# Patient Record
Sex: Female | Born: 1940 | Race: White | Hispanic: No | Marital: Married | Smoking: Never smoker
Health system: Southern US, Community
[De-identification: ages and names within clinical notes are randomized; demographics above are authoritative.]

## PROBLEM LIST (undated history)

## (undated) DIAGNOSIS — K219 Gastro-esophageal reflux disease without esophagitis: Secondary | ICD-10-CM

## (undated) DIAGNOSIS — E785 Hyperlipidemia, unspecified: Secondary | ICD-10-CM

## (undated) DIAGNOSIS — E079 Disorder of thyroid, unspecified: Secondary | ICD-10-CM

## (undated) DIAGNOSIS — E039 Hypothyroidism, unspecified: Secondary | ICD-10-CM

## (undated) DIAGNOSIS — C801 Malignant (primary) neoplasm, unspecified: Secondary | ICD-10-CM

## (undated) DIAGNOSIS — M1990 Unspecified osteoarthritis, unspecified site: Secondary | ICD-10-CM

## (undated) HISTORY — DX: Disorder of thyroid, unspecified: E07.9

## (undated) HISTORY — DX: Hyperlipidemia, unspecified: E78.5

## (undated) HISTORY — PX: APPENDECTOMY: SHX54

## (undated) HISTORY — PX: MELANOMA EXCISION: SHX5266

---

## 2004-08-26 LAB — HM COLONOSCOPY: HM Colonoscopy: NORMAL

## 2004-08-26 LAB — HM DEXA SCAN: HM Dexa Scan: NORMAL

## 2008-06-23 ENCOUNTER — Ambulatory Visit: Payer: Self-pay | Admitting: Otolaryngology

## 2010-06-22 LAB — HM PAP SMEAR: HM Pap smear: NORMAL

## 2011-07-08 ENCOUNTER — Other Ambulatory Visit: Payer: Medicare Other

## 2011-07-11 ENCOUNTER — Ambulatory Visit (INDEPENDENT_AMBULATORY_CARE_PROVIDER_SITE_OTHER): Payer: Medicare Other | Admitting: Internal Medicine

## 2011-07-11 ENCOUNTER — Encounter: Payer: Self-pay | Admitting: *Deleted

## 2011-07-11 ENCOUNTER — Encounter: Payer: Self-pay | Admitting: Internal Medicine

## 2011-07-11 DIAGNOSIS — Z13220 Encounter for screening for lipoid disorders: Secondary | ICD-10-CM

## 2011-07-11 DIAGNOSIS — Z0000 Encounter for general adult medical examination without abnormal findings: Secondary | ICD-10-CM

## 2011-07-11 DIAGNOSIS — M8580 Other specified disorders of bone density and structure, unspecified site: Secondary | ICD-10-CM

## 2011-07-11 DIAGNOSIS — Z23 Encounter for immunization: Secondary | ICD-10-CM

## 2011-07-11 DIAGNOSIS — E785 Hyperlipidemia, unspecified: Secondary | ICD-10-CM

## 2011-07-11 DIAGNOSIS — Z124 Encounter for screening for malignant neoplasm of cervix: Secondary | ICD-10-CM

## 2011-07-11 DIAGNOSIS — R5381 Other malaise: Secondary | ICD-10-CM

## 2011-07-11 DIAGNOSIS — E039 Hypothyroidism, unspecified: Secondary | ICD-10-CM

## 2011-07-11 DIAGNOSIS — Z1211 Encounter for screening for malignant neoplasm of colon: Secondary | ICD-10-CM | POA: Insufficient documentation

## 2011-07-11 DIAGNOSIS — Z1239 Encounter for other screening for malignant neoplasm of breast: Secondary | ICD-10-CM | POA: Insufficient documentation

## 2011-07-11 DIAGNOSIS — R5383 Other fatigue: Secondary | ICD-10-CM

## 2011-07-11 LAB — LIPID PANEL
HDL: 61.2 mg/dL (ref >39.00)
Triglycerides: 95 mg/dL (ref 0.0–149.0)

## 2011-07-11 LAB — CBC WITH DIFFERENTIAL/PLATELET
Basophils Absolute: 0 10*3/uL (ref 0.0–0.1)
Eosinophils Absolute: 0.1 10*3/uL (ref 0.0–0.7)
Lymphocytes Relative: 28.1 % (ref 12.0–46.0)
MCHC: 33 g/dL (ref 30.0–36.0)
MCV: 96.8 fl (ref 78.0–100.0)
Monocytes Absolute: 0.4 10*3/uL (ref 0.1–1.0)
Neutrophils Relative %: 62.2 % (ref 43.0–77.0)
Platelets: 166 10*3/uL (ref 150.0–400.0)
RDW: 13.2 % (ref 11.5–14.6)

## 2011-07-11 LAB — COMPREHENSIVE METABOLIC PANEL
ALT: 23 U/L (ref 0–35)
AST: 25 U/L (ref 0–37)
Albumin: 4 g/dL (ref 3.5–5.2)
Alkaline Phosphatase: 50 U/L (ref 39–117)
BUN: 13 mg/dL (ref 6–23)
Calcium: 9.4 mg/dL (ref 8.4–10.5)
Chloride: 105 mEq/L (ref 96–112)
Potassium: 4.2 mEq/L (ref 3.5–5.1)
Sodium: 143 mEq/L (ref 135–145)
Total Protein: 7.6 g/dL (ref 6.0–8.3)

## 2011-07-11 LAB — LDL CHOLESTEROL, DIRECT: Direct LDL: 184.3 mg/dL

## 2011-07-11 NOTE — Progress Notes (Signed)
  Subjective:    Patient ID: Samantha Hanna, female    DOB: 11/15/1940, 70 y.o.   MRN: [id number]  HPI    Review of Systems     Objective:   Physical Exam        Assessment & Plan:   Subjective:     Samantha Hanna is a 70 y.o. female and is here for a comprehensive physical exam. The patient reports no problems.  History   Social History  . Marital Status: Married    Spouse Name: N/A    Number of Children: N/A  . Years of Education: N/A   Occupational History  . Not on file.   Social History Main Topics  . Smoking status: Never Smoker   . Smokeless tobacco: Never Used  . Alcohol Use: Yes  . Drug Use: No  . Sexually Active: Not on file   Other Topics Concern  . Not on file   Social History Narrative  . No narrative on file   Health Maintenance  Topic Date Due  . Tetanus/tdap  07/02/1962  . Mammogram  07/02/1993  . Zostavax  07/03/2003  . Influenza Vaccine  05/27/2011  . Colonoscopy  08/26/2014  . Pneumococcal Polysaccharide Vaccine Age 70 And Over  Completed    The following portions of the patient's history were reviewed and updated as appropriate: allergies, current medications, past family history, past medical history, past social history, past surgical history and problem list.  Review of Systems A comprehensive review of systems was negative.   Objective:    BP 118/64  Pulse 72  Temp(Src) 98.3 F (36.8 C) (Oral)  Resp 16  Ht 5\' 8"  (1.727 m)  Wt 149 lb 4 oz (67.699 kg)  BMI 22.69 kg/m2  SpO2 99% General appearance: alert, cooperative and appears stated age Ears: normal TM's and external ear canals both ears Throat: lips, mucosa, and tongue normal; teeth and gums normal Neck: no adenopathy, no carotid bruit, no JVD, supple, symmetrical, trachea midline and thyroid not enlarged, symmetric, no tenderness/mass/nodules Back: symmetric, no curvature. ROM normal. No CVA tenderness. Lungs: clear to auscultation bilaterally Breasts: normal appearance, no  masses or tenderness Heart: regular rate and rhythm, S1, S2 normal, no murmur, click, rub or gallop Abdomen: soft, non-tender; bowel sounds normal; no masses,  no organomegaly Extremities: extremities normal, atraumatic, no cyanosis or edema Pulses: 2+ and symmetric Skin: Skin color, texture, turgor normal. No rashes or lesions Neurologic: Alert and oriented X 3, normal strength and tone. Normal symmetric reflexes. Normal coordination and gait    Assessment:    Healthy female exam.    Pelvic exam was deferred per patient preference, as she had a normal PAP smear in 2011.  Plan:    Continue daily exercise.   Fasting lipids ordered and will review.  Has histroy of myalgias with statin therapy.  Adding baby aspirin several times weekly for stroke and CAD primary prevention

## 2011-07-11 NOTE — Patient Instructions (Signed)
We are checking your thyroid and cholesterol today and will adjust your Synthroid dose if necessary.  I do recommend taking a baby aspirin or a 1/2 regular aspirin a few times weekly for the stroke reduction benefits

## 2011-07-12 ENCOUNTER — Other Ambulatory Visit: Payer: Self-pay | Admitting: *Deleted

## 2011-07-12 LAB — TSH: TSH: 1.64 u[IU]/mL (ref 0.35–5.50)

## 2011-07-12 MED ORDER — LEVOTHYROXINE SODIUM 75 MCG PO TABS: 75.0000 ug | ORAL_TABLET | Freq: Every day | ORAL | Status: DC

## 2011-07-13 ENCOUNTER — Encounter: Payer: Self-pay | Admitting: Internal Medicine

## 2011-07-17 ENCOUNTER — Encounter: Payer: Self-pay | Admitting: Internal Medicine

## 2011-07-17 ENCOUNTER — Other Ambulatory Visit: Payer: Medicare Other

## 2011-07-17 ENCOUNTER — Other Ambulatory Visit: Payer: Self-pay | Admitting: Internal Medicine

## 2011-07-17 DIAGNOSIS — Z1211 Encounter for screening for malignant neoplasm of colon: Secondary | ICD-10-CM

## 2011-07-17 LAB — FECAL OCCULT BLOOD, IMMUNOCHEMICAL: Fecal Occult Bld: NEGATIVE

## 2011-07-24 LAB — HM MAMMOGRAPHY: HM Mammogram: NORMAL

## 2011-08-21 ENCOUNTER — Encounter: Payer: Self-pay | Admitting: Internal Medicine

## 2011-08-29 ENCOUNTER — Encounter: Payer: Self-pay | Admitting: Internal Medicine

## 2011-09-03 ENCOUNTER — Other Ambulatory Visit: Payer: Medicare Other

## 2011-10-29 ENCOUNTER — Encounter: Payer: Self-pay | Admitting: Internal Medicine

## 2012-02-20 ENCOUNTER — Other Ambulatory Visit: Payer: Self-pay | Admitting: *Deleted

## 2012-02-20 MED ORDER — LEVOTHYROXINE SODIUM 75 MCG PO TABS: 75.0000 ug | ORAL_TABLET | Freq: Every day | ORAL | Status: DC

## 2012-03-26 ENCOUNTER — Other Ambulatory Visit: Payer: Self-pay | Admitting: Internal Medicine

## 2012-03-26 MED ORDER — LEVOTHYROXINE SODIUM 75 MCG PO TABS: 75.0000 ug | ORAL_TABLET | Freq: Every day | ORAL | Status: DC

## 2012-07-08 LAB — HM PAP SMEAR: HM Pap smear: NORMAL

## 2012-07-14 ENCOUNTER — Encounter: Payer: Self-pay | Admitting: Internal Medicine

## 2012-07-14 ENCOUNTER — Ambulatory Visit (INDEPENDENT_AMBULATORY_CARE_PROVIDER_SITE_OTHER): Payer: Medicare Other | Admitting: Internal Medicine

## 2012-07-14 ENCOUNTER — Other Ambulatory Visit (HOSPITAL_COMMUNITY)
Admission: RE | Admit: 2012-07-14 | Discharge: 2012-07-14 | Disposition: A | Discharge status: Home / Self Care | Payer: Medicare Other | Source: Ambulatory Visit | Attending: Internal Medicine | Admitting: Internal Medicine

## 2012-07-14 VITALS — BP 110/68 | HR 75 | Temp 98.2°F | Ht 67.5 in | Wt 148.5 lb

## 2012-07-14 DIAGNOSIS — Z789 Other specified health status: Secondary | ICD-10-CM

## 2012-07-14 DIAGNOSIS — Z23 Encounter for immunization: Secondary | ICD-10-CM

## 2012-07-14 DIAGNOSIS — Z124 Encounter for screening for malignant neoplasm of cervix: Secondary | ICD-10-CM

## 2012-07-14 DIAGNOSIS — R5383 Other fatigue: Secondary | ICD-10-CM

## 2012-07-14 DIAGNOSIS — E039 Hypothyroidism, unspecified: Secondary | ICD-10-CM

## 2012-07-14 DIAGNOSIS — Z1211 Encounter for screening for malignant neoplasm of colon: Secondary | ICD-10-CM

## 2012-07-14 DIAGNOSIS — Z1239 Encounter for other screening for malignant neoplasm of breast: Secondary | ICD-10-CM

## 2012-07-14 DIAGNOSIS — E785 Hyperlipidemia, unspecified: Secondary | ICD-10-CM

## 2012-07-14 DIAGNOSIS — R5381 Other malaise: Secondary | ICD-10-CM

## 2012-07-14 DIAGNOSIS — Z01419 Encounter for gynecological examination (general) (routine) without abnormal findings: Secondary | ICD-10-CM | POA: Insufficient documentation

## 2012-07-14 DIAGNOSIS — Z1151 Encounter for screening for human papillomavirus (HPV): Secondary | ICD-10-CM | POA: Insufficient documentation

## 2012-07-14 DIAGNOSIS — Z0000 Encounter for general adult medical examination without abnormal findings: Secondary | ICD-10-CM

## 2012-07-14 HISTORY — DX: Other specified health status: Z78.9

## 2012-07-14 LAB — CBC WITH DIFFERENTIAL/PLATELET
Eosinophils Relative: 1.7 % (ref 0.0–5.0)
Lymphocytes Relative: 29 % (ref 12.0–46.0)
MCV: 95.3 fl (ref 78.0–100.0)
Monocytes Absolute: 0.5 10*3/uL (ref 0.1–1.0)
Monocytes Relative: 10.6 % (ref 3.0–12.0)
Neutrophils Relative %: 58.2 % (ref 43.0–77.0)
Platelets: 179 10*3/uL (ref 150.0–400.0)
WBC: 4.4 10*3/uL — ABNORMAL LOW (ref 4.5–10.5)

## 2012-07-14 LAB — COMPREHENSIVE METABOLIC PANEL
Albumin: 4.1 g/dL (ref 3.5–5.2)
Alkaline Phosphatase: 57 U/L (ref 39–117)
Chloride: 103 mEq/L (ref 96–112)
Glucose, Bld: 89 mg/dL (ref 70–99)
Potassium: 4.1 mEq/L (ref 3.5–5.1)
Sodium: 141 mEq/L (ref 135–145)
Total Protein: 7.7 g/dL (ref 6.0–8.3)

## 2012-07-14 LAB — TSH: TSH: 2.16 u[IU]/mL (ref 0.35–5.50)

## 2012-07-14 LAB — LIPID PANEL
Cholesterol: 262 mg/dL — ABNORMAL HIGH (ref 0–200)
Total CHOL/HDL Ratio: 5
VLDL: 15.4 mg/dL (ref 0.0–40.0)

## 2012-07-14 NOTE — Progress Notes (Signed)
Patient ID: Samantha Hanna, female   DOB: [birth year], 71 y.o.   MRN: [id number]  The patient is here for annual Medicare wellness examination and management of other chronic and acute problems. Last seen one year ago.  Husband had emergency hi p replacement last Christmas several months ago and has recovered nicely, but the whole experience was "harrowing" for her because he has Parkinson's disease and she worries about him constantly .  Daughters are supportive and helped out with his recovery.     The risk factors are reflected in the social history.  The roster of all physicians providing medical care to patient - is listed in the Snapshot section of the chart.  Activities of daily living:  The patient is 100% independent in all ADLs: dressing, toileting, feeding as well as independent mobility  Home safety : The patient has smoke detectors in the home. They wear seatbelts.  There are no firearms at home. There is no violence in the home.   There is no risks for hepatitis, STDs or HIV. There is no   history of blood transfusion. They have no travel history to infectious disease endemic areas of the world.  The patient has seen their dentist in the last six month. They have seen their eye doctor in the last year. They admit to slight hearing difficulty with regard to whispered voices and some television programs.  They have deferred audiologic testing in the last year.  They do not  have excessive sun exposure. Discussed the need for sun protection: hats, long sleeves and use of sunscreen if there is significant sun exposure.   Diet: the importance of a healthy diet is discussed. They do have a healthy diet.  The benefits of regular aerobic exercise were discussed. She walks 4 times per week ,  20 minutes.   Depression screen: there are no signs or vegative symptoms of depression- irritability, change in appetite, anhedonia, sadness/tearfullness.  Cognitive assessment: the patient manages all their  financial and personal affairs and is actively engaged. They could relate day,date,year and events; recalled 2/3 objects at 3 minutes; performed clock-face test normally.  The following portions of the patient's history were reviewed and updated as appropriate: allergies, current medications, past family history, past medical history,  past surgical history, past social history  and problem list.  Visual acuity was not assessed per patient preference since she has regular follow up with her ophthalmologist. Hearing and body mass index were assessed and reviewed.   During the course of the visit the patient was educated and counseled about appropriate screening and preventive services including : fall prevention , diabetes screening, nutrition counseling, colorectal cancer screening, and recommended immunizations.    BP 110/68  Pulse 75  Temp 98.2 F (36.8 C) (Oral)  Ht 5' 7.5" (1.715 m)  Wt 148 lb 8 oz (67.359 kg)  BMI 22.92 kg/m2  SpO2 96%  General appearance: alert, cooperative and appears stated age Ears: normal TM's and external ear canals both ears Throat: lips, mucosa, and tongue normal; teeth and gums normal Neck: no adenopathy, no carotid bruit, supple, symmetrical, trachea midline and thyroid not enlarged, symmetric, no tenderness/mass/nodules Back: symmetric, no curvature. ROM normal. No CVA tenderness. Lungs: clear to auscultation bilaterally Heart: regular rate and rhythm, S1, S2 normal, no murmur, click, rub or gallop Abdomen: soft, non-tender; bowel sounds normal; no masses,  no organomegaly Pulses: 2+ and symmetric Skin: Skin color, texture, turgor normal. No rashes or lesions Lymph nodes: Cervical, supraclavicular, and  axillary nodes normal.  Assessment and Plan  Routine general medical examination at a health care facility Breast exam and comprehensive exam without pelvic was done today.  Unspecified hypothyroidism Repeat TSH done annually for management of  hypothyroidism was done today. Her TSH was normal. Center will be refilled her regular dose.   Updated Medication List Outpatient Encounter Prescriptions as of 07/14/2012  Medication Sig Dispense Refill  . calcium carbonate (OS-CAL) 600 MG TABS Take 600 mg by mouth 2 (two) times daily with a meal.        . cholecalciferol (VITAMIN D) 1000 UNITS tablet Take 1,000 Units by mouth daily.        Marland Kitchen co-enzyme Q-10 30 MG capsule Take 30 mg by mouth 3 (three) times daily.        . fish oil-omega-3 fatty acids 1000 MG capsule Take 2 g by mouth daily.        Marland Kitchen FOLIC ACID PO Take by mouth.        . levothyroxine (SYNTHROID, LEVOTHROID) 75 MCG tablet Take 1 tablet (75 mcg total) by mouth daily.  90 tablet  3  . [DISCONTINUED] levothyroxine (SYNTHROID, LEVOTHROID) 75 MCG tablet Take 1 tablet (75 mcg total) by mouth daily.  30 tablet  6

## 2012-07-14 NOTE — Patient Instructions (Addendum)
We will refill your Synthroid once we review your current TSH   Consider a trial of Estrace,  Vagifem or Est ring to help manage your dryness.

## 2012-07-15 ENCOUNTER — Encounter: Payer: Self-pay | Admitting: Internal Medicine

## 2012-07-15 DIAGNOSIS — Z01818 Encounter for other preprocedural examination: Secondary | ICD-10-CM | POA: Insufficient documentation

## 2012-07-15 DIAGNOSIS — Z1239 Encounter for other screening for malignant neoplasm of breast: Secondary | ICD-10-CM

## 2012-07-15 DIAGNOSIS — E039 Hypothyroidism, unspecified: Secondary | ICD-10-CM

## 2012-07-15 HISTORY — DX: Encounter for other preprocedural examination: Z01.818

## 2012-07-15 HISTORY — DX: Hypothyroidism, unspecified: E03.9

## 2012-07-15 MED ORDER — LEVOTHYROXINE SODIUM 75 MCG PO TABS: 75.0000 ug | ORAL_TABLET | Freq: Every day | ORAL | Status: DC

## 2012-07-15 NOTE — Assessment & Plan Note (Signed)
Breast exam and comprehensive exam without pelvic was done today.

## 2012-07-15 NOTE — Assessment & Plan Note (Signed)
Repeat TSH done annually for management of hypothyroidism was done today. Her TSH was normal. Center will be refilled her regular dose.

## 2012-07-17 ENCOUNTER — Encounter: Payer: Self-pay | Admitting: Internal Medicine

## 2012-07-20 NOTE — Addendum Note (Signed)
Addended by: Sherlene Shams on: 07/20/2012 12:46 PM   Modules accepted: Orders

## 2012-09-09 ENCOUNTER — Telehealth: Payer: Self-pay | Admitting: Internal Medicine

## 2012-09-09 ENCOUNTER — Ambulatory Visit (INDEPENDENT_AMBULATORY_CARE_PROVIDER_SITE_OTHER): Payer: Medicare Other | Admitting: Internal Medicine

## 2012-09-09 ENCOUNTER — Encounter: Payer: Self-pay | Admitting: Internal Medicine

## 2012-09-09 VITALS — BP 112/72 | HR 68 | Temp 98.3°F | Ht 67.5 in | Wt 148.2 lb

## 2012-09-09 DIAGNOSIS — R197 Diarrhea, unspecified: Secondary | ICD-10-CM

## 2012-09-09 LAB — CBC WITH DIFFERENTIAL/PLATELET
Basophils Relative: 0.5 % (ref 0.0–3.0)
Eosinophils Absolute: 0.1 10*3/uL (ref 0.0–0.7)
HCT: 42.6 % (ref 36.0–46.0)
Hemoglobin: 14.2 g/dL (ref 12.0–15.0)
MCHC: 33.4 g/dL (ref 30.0–36.0)
MCV: 94.8 fl (ref 78.0–100.0)
Monocytes Absolute: 0.5 10*3/uL (ref 0.1–1.0)
Neutro Abs: 3.8 10*3/uL (ref 1.4–7.7)
RBC: 4.5 Mil/uL (ref 3.87–5.11)

## 2012-09-09 LAB — HEPATIC FUNCTION PANEL
ALT: 22 U/L (ref 0–35)
AST: 21 U/L (ref 0–37)
Albumin: 4.2 g/dL (ref 3.5–5.2)

## 2012-09-09 LAB — BASIC METABOLIC PANEL
CO2: 29 mEq/L (ref 19–32)
Calcium: 9.7 mg/dL (ref 8.4–10.5)
GFR: 82.66 mL/min (ref >60.00)
Sodium: 140 mEq/L (ref 135–145)

## 2012-09-09 NOTE — Telephone Encounter (Signed)
Just an FYI

## 2012-09-09 NOTE — Patient Instructions (Signed)
It was nice meeting you today.  I am sorry you have not been feeling well.  I am going to check some labs and stool tests - as we discussed.  I want you to try Florastor - twice a day.  Let us know if problems.

## 2012-09-09 NOTE — Telephone Encounter (Signed)
Patient Information:  Caller Name: [name]  Phone: [phone]  Patient: Samantha Hanna, Samantha Hanna  Gender: Female  DOB: Oct 22, 1940  Age: 72 Years  PCP: Duncan Dull (Adults only)  Office Follow Up:  Does the office need to follow up with this patient?: No  Instructions For The Office: N/A   Symptoms  Reason For Call & Symptoms: Sudden onset of abdominal cramping then diarrhea 2 weeks ago.  Still intermittent diarrhea stools  Reviewed Health History In EMR: Yes  Reviewed Medications In EMR: Yes  Reviewed Allergies In EMR: Yes  Reviewed Surgeries / Procedures: Yes  Date of Onset of Symptoms: 08/26/2012  Treatments Tried: watching foods, added acid reducer Zantac 2 days ago  Treatments Tried Worked: Yes  Guideline(s) Used:  Diarrhea  Disposition Per Guideline:   See Within 3 Days in Office  Reason For Disposition Reached:   Diarrhea persists > 7 days  Advice Given:  Fluids:  Drink more fluids, at least 8-10 glasses (8 oz or 240 ml) daily.  For example: sports drinks, diluted fruit juices, soft drinks.  Supplement this with saltine crackers or soups to make certain that you are getting sufficient fluid and salt to meet your body's needs.  Avoid caffeinated beverages (Reason: caffeine is mildly dehydrating).  Nutrition:  Maintaining some food intake during episodes of diarrhea is important.  Ideal initial foods include boiled starches/cereals (e.g., potatoes, rice, noodles, wheat, oats) with a small amount of salt to taste.  Other acceptable foods include: bananas, yogurt, crackers, soup.  Appointment Scheduled:  09/09/2012 10:15:00 Appointment Scheduled Provider:  D[place]

## 2012-09-09 NOTE — Telephone Encounter (Signed)
Notified pt of lab results via my chart.   

## 2012-09-09 NOTE — Telephone Encounter (Signed)
Forward to Dr. Tullo 

## 2012-09-10 ENCOUNTER — Encounter: Payer: Self-pay | Admitting: Internal Medicine

## 2012-09-10 NOTE — Progress Notes (Signed)
Subjective:    Patient ID: Samantha Hanna, female    DOB: Jan 10, 1941, 72 y.o.   MRN: [id number]  HPI 72 year old female with past history of hypercholesterolemia who comes in today as a work in with concerns regarding persistent diarrhea.  She states she started having problems - the first of the year.  Had "severe diarrhea".  No nausea.  No vomiting.  Feels full.  States the full feeling started before the diarrhea.  She is eating and drinking, but is eating bland foods.  When she advances her diet, the diarrhea seems to increase.  She is also having some mild dull headaches and backaches, with the loose stool.  This appears to be worse at night.  No fever.  No blood in the stool.  States it will seem as if it is getting better and then "flare again".  Some form at times to the stool and then turns back watery.  Continues to have three episodes a day.  Occasionally will notice bilateral side and lower abdominal discomfort, but no significant abdominal pain.  States she is up to date with her colonoscopy and is due next year.  Reports no history of diverticulosis.  Has taken some ranitidine, which has helped some with the full feeling.    Past Medical History  Diagnosis Date  . Hyperlipidemia   . Thyroid disease     Current Outpatient Prescriptions on File Prior to Visit  Medication Sig Dispense Refill  . calcium carbonate (OS-CAL) 600 MG TABS Take 600 mg by mouth 2 (two) times daily with a meal.        . cholecalciferol (VITAMIN D) 1000 UNITS tablet Take 1,000 Units by mouth daily.        Marland Kitchen co-enzyme Q-10 30 MG capsule Take 30 mg by mouth 3 (three) times daily.        . fish oil-omega-3 fatty acids 1000 MG capsule Take 2 g by mouth daily.        Marland Kitchen FOLIC ACID PO Take by mouth.        . levothyroxine (SYNTHROID, LEVOTHROID) 75 MCG tablet Take 1 tablet (75 mcg total) by mouth daily.  90 tablet  3    Review of Systems Patient denies any lightheadedness or dizziness.  Some intermittent headache as  outlined.  No chest pain, tightness or palpitations.  No increased shortness of breath, cough or congestion.  No nausea or vomiting.  Eating bland foods.  This does help.  Abdominal discomfort intermittently.  See above.  Persistent loose stool.   No BRBPR or melana.  No urine change.   No recent travel.  No one else sick.  No recent abx usage.       Objective:   Physical Exam Filed Vitals:   09/09/12 1014  BP: 112/72  Pulse: 68  Temp: 98.3 F (86.37 C)   72 year old female in no acute distress.   HEENT:  Nares - clear.  OP- without lesions or erythema.  Mucus membranes moist.  NECK:  Supple, nontender.  No audible bruit.   HEART:  Appears to be regular. LUNGS:  Without crackles or wheezing audible.  Respirations even and unlabored.   RADIAL PULSE:  Equal bilaterally.  ABDOMEN:  Soft.  No significant tenderness to palpation.  No audible abdominal bruit.   EXTREMITIES:  No increased edema to be present.                     Assessment &  Plan:  PERSISTENT DIARRHEA/LOOSE STOOL.  Symptoms and exam as outlined.  No significant pain.  Able to eat bland foods.  No vomiting.  Will check cbc, met b and liver panel.  Given persistence, will also check stool studies.  Can continue the Ranitidine if feels this is helping.  Will have her use Florastor - bid.  Will hold on scanning at this time.  Reports up to date with colonoscopy.  Will need to follow up.  If persistent and the above unrevealing, will need further testing and possible scanning.  Discussed in detail with the patient.  She was comfortable with this plan.  She knows if she has any worsening change in symptoms or problems, she is to be reevaluated.

## 2012-09-16 LAB — CLOSTRIDIUM DIFFICILE BY PCR: Toxigenic C. Difficile by PCR: NOT DETECTED

## 2013-07-01 ENCOUNTER — Other Ambulatory Visit: Payer: Self-pay

## 2013-09-07 ENCOUNTER — Encounter: Payer: Self-pay | Admitting: Internal Medicine

## 2013-09-07 ENCOUNTER — Ambulatory Visit (INDEPENDENT_AMBULATORY_CARE_PROVIDER_SITE_OTHER): Payer: Medicare Other | Admitting: Internal Medicine

## 2013-09-07 VITALS — BP 122/74 | HR 77 | Temp 97.3°F | Resp 14 | Ht 67.5 in | Wt 147.0 lb

## 2013-09-07 DIAGNOSIS — Z1211 Encounter for screening for malignant neoplasm of colon: Secondary | ICD-10-CM

## 2013-09-07 DIAGNOSIS — R5383 Other fatigue: Secondary | ICD-10-CM

## 2013-09-07 DIAGNOSIS — E559 Vitamin D deficiency, unspecified: Secondary | ICD-10-CM

## 2013-09-07 DIAGNOSIS — Z1239 Encounter for other screening for malignant neoplasm of breast: Secondary | ICD-10-CM

## 2013-09-07 DIAGNOSIS — E785 Hyperlipidemia, unspecified: Secondary | ICD-10-CM | POA: Diagnosis not present

## 2013-09-07 DIAGNOSIS — E039 Hypothyroidism, unspecified: Secondary | ICD-10-CM

## 2013-09-07 DIAGNOSIS — Z0000 Encounter for general adult medical examination without abnormal findings: Secondary | ICD-10-CM | POA: Diagnosis not present

## 2013-09-07 DIAGNOSIS — R5381 Other malaise: Secondary | ICD-10-CM | POA: Diagnosis not present

## 2013-09-07 DIAGNOSIS — K589 Irritable bowel syndrome without diarrhea: Secondary | ICD-10-CM

## 2013-09-07 NOTE — Patient Instructions (Signed)
You had your annual Medicare wellness exam today  We will schedule your mammogram soon.   We will contact you with the bloodwork results 

## 2013-09-07 NOTE — Progress Notes (Signed)
Pre-visit discussion using our clinic review tool. No additional management support is needed unless otherwise documented below in the visit note.  

## 2013-09-07 NOTE — Progress Notes (Signed)
Patient ID: Samantha Hanna, female   DOB: [birth year], 73 y.o.   MRN: [id number]  The patient is here for annual Medicare wellness examination and management of other chronic and acute problems.   The risk factors are reflected in the social history.  The roster of all physicians providing medical care to patient - is listed in the Snapshot section of the chart.  Activities of daily living:  The patient is 100% independent in all ADLs: dressing, toileting, feeding as well as independent mobility  Home safety : The patient has smoke detectors in the home. They wear seatbelts.  There are no firearms at home. There is no violence in the home.   There is no risks for hepatitis, STDs or HIV. There is no   history of blood transfusion. They have no travel history to infectious disease endemic areas of the world.  The patient has seen their dentist in the last six month. They have seen their eye doctor in the last year. They admit to slight hearing difficulty with regard to whispered voices and some television programs.  They have deferred audiologic testing in the last year.  They do not  have excessive sun exposure. Discussed the need for sun protection: hats, long sleeves and use of sunscreen if there is significant sun exposure.   Diet: the importance of a healthy diet is discussed. They do have a healthy diet.  The benefits of regular aerobic exercise were discussed. She walks 4 times per week ,  20 minutes.   Depression screen: there are no signs or vegative symptoms of depression- irritability, change in appetite, anhedonia, sadness/tearfullness.  Cognitive assessment: the patient manages all their financial and personal affairs and is actively engaged. They could relate day,date,year and events; recalled 2/3 objects at 3 minutes; performed clock-face test normally.  The following portions of the patient's history were reviewed and updated as appropriate: allergies, current medications, past family  history, past medical history,  past surgical history, past social history  and problem list.  Visual acuity was not assessed per patient preference since she has regular follow up with her ophthalmologist. Hearing and body mass index were assessed and reviewed.   During the course of the visit the patient was educated and counseled about appropriate screening and preventive services including : fall prevention , diabetes screening, nutrition counseling, colorectal cancer screening, and recommended immunizations.    Objective:  BP 122/74  Pulse 77  Temp(Src) 97.3 F (36.3 C) (Oral)  Resp 14  Ht 5' 7.5" (1.715 m)  Wt 147 lb (66.679 kg)  BMI 22.67 kg/m2  SpO2 98%  General Appearance:    Alert, cooperative, no distress, appears stated age  Head:    Normocephalic, without obvious abnormality, atraumatic  Eyes:    PERRL, conjunctiva/corneas clear, EOM's intact, fundi    benign, both eyes  Ears:    Normal TM's and external ear canals, both ears  Nose:   Nares normal, septum midline, mucosa normal, no drainage    or sinus tenderness  Throat:   Lips, mucosa, and tongue normal; teeth and gums normal  Neck:   Supple, symmetrical, trachea midline, no adenopathy;    thyroid:  no enlargement/tenderness/nodules; no carotid   bruit or JVD  Back:     Symmetric, no curvature, ROM normal, no CVA tenderness  Lungs:     Clear to auscultation bilaterally, respirations unlabored  Chest Wall:    No tenderness or deformity   Heart:    Regular rate and  rhythm, S1 and S2 normal, no murmur, rub   or gallop  Breast Exam:    No tenderness, masses, or nipple abnormality  Abdomen:     Soft, non-tender, bowel sounds active all four quadrants,    no masses, no organomegaly  Extremities:   Extremities normal, atraumatic, no cyanosis or edema  Pulses:   2+ and symmetric all extremities  Skin:   Skin color, texture, turgor normal, no rashes or lesions  Lymph nodes:   Cervical, supraclavicular, and axillary nodes  normal  Neurologic:   CNII-XII intact, normal strength, sensation and reflexes    throughout    Assessment and Plan:  Routine general medical examination at a health care facility Annual comprehensive exam was done including breast, excluding pelvic and PAP smear. All screenings have been addressed .   Unspecified hypothyroidism She will return for fasting labs and TSH and Synthroid will be refilled based on evaluation of labs.  Irritable bowel syndrome Her symptoms of diarrhea are brought on by aggravation of anxiety. She is currently having no issues since her husband appears to be doing well. Her symptoms are managed with a daily probiotic and a when necessary an antacid.   Updated Medication List Outpatient Encounter Prescriptions as of 09/07/2013  Medication Sig  . co-enzyme Q-10 30 MG capsule Take 30 mg by mouth 3 (three) times daily.    . fish oil-omega-3 fatty acids 1000 MG capsule Take 2 g by mouth daily.    Marland Kitchen levothyroxine (SYNTHROID, LEVOTHROID) 75 MCG tablet Take 1 tablet (75 mcg total) by mouth daily.  . calcium carbonate (OS-CAL) 600 MG TABS Take 600 mg by mouth 2 (two) times daily with a meal.    . cholecalciferol (VITAMIN D) 1000 UNITS tablet Take 1,000 Units by mouth daily.    Marland Kitchen FOLIC ACID PO Take by mouth.

## 2013-09-08 ENCOUNTER — Other Ambulatory Visit (INDEPENDENT_AMBULATORY_CARE_PROVIDER_SITE_OTHER): Payer: Medicare Other

## 2013-09-08 DIAGNOSIS — K589 Irritable bowel syndrome without diarrhea: Secondary | ICD-10-CM

## 2013-09-08 DIAGNOSIS — E559 Vitamin D deficiency, unspecified: Secondary | ICD-10-CM

## 2013-09-08 DIAGNOSIS — E039 Hypothyroidism, unspecified: Secondary | ICD-10-CM | POA: Diagnosis not present

## 2013-09-08 DIAGNOSIS — E785 Hyperlipidemia, unspecified: Secondary | ICD-10-CM

## 2013-09-08 DIAGNOSIS — R5383 Other fatigue: Principal | ICD-10-CM

## 2013-09-08 DIAGNOSIS — R5381 Other malaise: Secondary | ICD-10-CM | POA: Diagnosis not present

## 2013-09-08 HISTORY — DX: Irritable bowel syndrome, unspecified: K58.9

## 2013-09-08 LAB — CBC WITH DIFFERENTIAL/PLATELET
BASOS PCT: 0.5 % (ref 0.0–3.0)
Basophils Absolute: 0 10*3/uL (ref 0.0–0.1)
EOS ABS: 0.1 10*3/uL (ref 0.0–0.7)
Eosinophils Relative: 3.2 % (ref 0.0–5.0)
HEMATOCRIT: 41.6 % (ref 36.0–46.0)
HEMOGLOBIN: 14 g/dL (ref 12.0–15.0)
LYMPHS ABS: 1.5 10*3/uL (ref 0.7–4.0)
Lymphocytes Relative: 34.9 % (ref 12.0–46.0)
MCHC: 33.6 g/dL (ref 30.0–36.0)
MCV: 93.6 fl (ref 78.0–100.0)
Monocytes Absolute: 0.5 10*3/uL (ref 0.1–1.0)
Monocytes Relative: 11.1 % (ref 3.0–12.0)
NEUTROS ABS: 2.2 10*3/uL (ref 1.4–7.7)
Neutrophils Relative %: 50.3 % (ref 43.0–77.0)
Platelets: 190 10*3/uL (ref 150.0–400.0)
RBC: 4.45 Mil/uL (ref 3.87–5.11)
RDW: 14.1 % (ref 11.5–14.6)
WBC: 4.3 10*3/uL — ABNORMAL LOW (ref 4.5–10.5)

## 2013-09-08 LAB — TSH: TSH: 2.62 u[IU]/mL (ref 0.35–5.50)

## 2013-09-08 LAB — COMPREHENSIVE METABOLIC PANEL
ALT: 19 U/L (ref 0–35)
AST: 22 U/L (ref 0–37)
Albumin: 3.9 g/dL (ref 3.5–5.2)
Alkaline Phosphatase: 65 U/L (ref 39–117)
BUN: 17 mg/dL (ref 6–23)
CO2: 29 mEq/L (ref 19–32)
CREATININE: 0.7 mg/dL (ref 0.4–1.2)
Calcium: 9.5 mg/dL (ref 8.4–10.5)
Chloride: 107 mEq/L (ref 96–112)
GFR: 85.07 mL/min (ref >60.00)
Glucose, Bld: 91 mg/dL (ref 70–99)
Potassium: 4.3 mEq/L (ref 3.5–5.1)
Sodium: 142 mEq/L (ref 135–145)
Total Bilirubin: 0.9 mg/dL (ref 0.3–1.2)
Total Protein: 7.5 g/dL (ref 6.0–8.3)

## 2013-09-08 LAB — LIPID PANEL
CHOL/HDL RATIO: 4
CHOLESTEROL: 259 mg/dL — AB (ref 0–200)
HDL: 66.8 mg/dL (ref >39.00)
TRIGLYCERIDES: 70 mg/dL (ref 0.0–149.0)
VLDL: 14 mg/dL (ref 0.0–40.0)

## 2013-09-08 LAB — LDL CHOLESTEROL, DIRECT: Direct LDL: 172.6 mg/dL

## 2013-09-08 NOTE — Assessment & Plan Note (Signed)
Annual comprehensive exam was done including breast, excluding pelvic and PAP smear. All screenings have been addressed .  

## 2013-09-08 NOTE — Assessment & Plan Note (Signed)
Her symptoms of diarrhea are brought on by aggravation of anxiety. She is currently having no issues since her husband appears to be doing well. Her symptoms are managed with a daily probiotic and a when necessary and acid.

## 2013-09-08 NOTE — Assessment & Plan Note (Signed)
She will return for fasting labs and TSH and Synthroid will be refilled based on evaluation of labs.

## 2013-09-09 LAB — VITAMIN D 25 HYDROXY (VIT D DEFICIENCY, FRACTURES): Vit D, 25-Hydroxy: 39 ng/mL (ref 30–89)

## 2013-09-11 ENCOUNTER — Encounter: Payer: Self-pay | Admitting: Internal Medicine

## 2013-09-14 ENCOUNTER — Encounter: Payer: Self-pay | Admitting: Emergency Medicine

## 2013-09-14 ENCOUNTER — Telehealth: Payer: Self-pay | Admitting: Internal Medicine

## 2013-09-14 NOTE — Telephone Encounter (Signed)
See below my chart message  Appointment Request From: Samantha Hanna      With Provider: Deborra Medina, MD [-Primary Care Physician-]      Preferred Date Range: From 09/20/2013 To 09/30/2013      Preferred Times: Monday Morning, Tuesday Morning, Wednesday Morning, Thursday Morning      Reason: To address the following health maintenance concerns.   Mammogram      Comments:   I need to set up for my mammogram. Thank you.

## 2013-09-14 NOTE — Telephone Encounter (Signed)
Order has been printed ?

## 2013-09-20 ENCOUNTER — Other Ambulatory Visit: Payer: Self-pay | Admitting: Internal Medicine

## 2013-09-27 DIAGNOSIS — J301 Allergic rhinitis due to pollen: Secondary | ICD-10-CM | POA: Diagnosis not present

## 2013-09-27 DIAGNOSIS — J322 Chronic ethmoidal sinusitis: Secondary | ICD-10-CM | POA: Diagnosis not present

## 2013-09-27 DIAGNOSIS — Z1231 Encounter for screening mammogram for malignant neoplasm of breast: Secondary | ICD-10-CM | POA: Diagnosis not present

## 2013-09-27 DIAGNOSIS — J018 Other acute sinusitis: Secondary | ICD-10-CM | POA: Diagnosis not present

## 2013-09-27 DIAGNOSIS — J343 Hypertrophy of nasal turbinates: Secondary | ICD-10-CM | POA: Diagnosis not present

## 2013-10-25 ENCOUNTER — Encounter: Payer: Self-pay | Admitting: Internal Medicine

## 2013-11-18 ENCOUNTER — Other Ambulatory Visit (INDEPENDENT_AMBULATORY_CARE_PROVIDER_SITE_OTHER): Payer: Medicare Other

## 2013-11-18 ENCOUNTER — Encounter: Payer: Self-pay | Admitting: Internal Medicine

## 2013-11-18 DIAGNOSIS — Z1211 Encounter for screening for malignant neoplasm of colon: Secondary | ICD-10-CM

## 2013-11-18 LAB — FECAL OCCULT BLOOD, IMMUNOCHEMICAL: Fecal Occult Bld: NEGATIVE

## 2013-11-18 MED ORDER — LEVOTHYROXINE SODIUM 75 MCG PO TABS: 75.0000 ug | ORAL_TABLET | Freq: Every day | ORAL | Status: DC

## 2013-11-21 ENCOUNTER — Encounter: Payer: Self-pay | Admitting: Internal Medicine

## 2014-01-13 DIAGNOSIS — H43819 Vitreous degeneration, unspecified eye: Secondary | ICD-10-CM | POA: Diagnosis not present

## 2014-01-13 DIAGNOSIS — H5200 Hypermetropia, unspecified eye: Secondary | ICD-10-CM | POA: Diagnosis not present

## 2014-01-13 DIAGNOSIS — H524 Presbyopia: Secondary | ICD-10-CM | POA: Diagnosis not present

## 2014-01-13 DIAGNOSIS — H52229 Regular astigmatism, unspecified eye: Secondary | ICD-10-CM | POA: Diagnosis not present

## 2014-01-28 ENCOUNTER — Ambulatory Visit: Payer: Self-pay | Admitting: Otolaryngology

## 2014-01-28 DIAGNOSIS — J320 Chronic maxillary sinusitis: Secondary | ICD-10-CM | POA: Diagnosis not present

## 2014-01-28 DIAGNOSIS — J328 Other chronic sinusitis: Secondary | ICD-10-CM | POA: Diagnosis not present

## 2014-01-28 DIAGNOSIS — J3489 Other specified disorders of nose and nasal sinuses: Secondary | ICD-10-CM | POA: Diagnosis not present

## 2014-02-09 ENCOUNTER — Ambulatory Visit (INDEPENDENT_AMBULATORY_CARE_PROVIDER_SITE_OTHER): Payer: Medicare Other | Admitting: Adult Health

## 2014-02-09 ENCOUNTER — Encounter: Payer: Self-pay | Admitting: Adult Health

## 2014-02-09 VITALS — BP 144/82 | HR 58 | Temp 98.0°F | Resp 14 | Wt 149.5 lb

## 2014-02-09 DIAGNOSIS — R51 Headache: Secondary | ICD-10-CM | POA: Diagnosis not present

## 2014-02-09 DIAGNOSIS — R519 Headache, unspecified: Secondary | ICD-10-CM | POA: Insufficient documentation

## 2014-02-09 MED ORDER — METHOCARBAMOL 500 MG PO TABS: 500.0000 mg | ORAL_TABLET | Freq: Three times a day (TID) | ORAL | Status: DC | PRN

## 2014-02-09 NOTE — Progress Notes (Signed)
Patient ID: Samantha Hanna, female   DOB: [birth year], 73 y.o.   MRN: [id number]    Subjective:    Patient ID: Samantha Hanna, female    DOB: [birth year], 73 y.o.   MRN: [id number]  HPI  Pt is a pleasant 73 y/o female who presents with persistent, intermittent HA since 01/28/14. She had her eyes checked and her vision has not changed significantly. She does not require corrective lenses other than to read. She also saw ENT thinking that this might be her sinuses since she has had problems in the past. Sinus CT scan was negative. For her HA she has taken an OTC headache medication containing acetaminophen 250 mg, ASA 250 mg and caffeine 65 mg. She has only taken 1 or 2 tablets total.   HA is not localized in any particular area. She does not report any temporal pain. Reports pain as a dull, nagging ache.  Has multiple family members in town and she has been feeling very stressed and tense. Wants everything to go well. Has done some swimming which she feels has helped her symptoms and also had a massage. Feels muscles are very tense.   Past Medical History  Diagnosis Date  . Hyperlipidemia   . Thyroid disease     Current Outpatient Prescriptions on File Prior to Visit  Medication Sig Dispense Refill  . calcium carbonate (OS-CAL) 600 MG TABS Take 600 mg by mouth 2 (two) times daily with a meal.        . cholecalciferol (VITAMIN D) 1000 UNITS tablet Take 1,000 Units by mouth daily.        . fish oil-omega-3 fatty acids 1000 MG capsule Take 2 g by mouth daily.        Marland Kitchen FOLIC ACID PO Take by mouth.        . levothyroxine (SYNTHROID, LEVOTHROID) 75 MCG tablet Take 1 tablet (75 mcg total) by mouth daily.  90 tablet  3  . co-enzyme Q-10 30 MG capsule Take 30 mg by mouth 3 (three) times daily.         No current facility-administered medications on file prior to visit.     Review of Systems  Constitutional: Negative.   HENT: Negative for congestion.        Recent appt with ENT. No sinus problems  Eyes:  Negative for visual disturbance.       Recent eye exam  Respiratory: Negative.   Cardiovascular: Negative.   Gastrointestinal: Negative.   Neurological: Positive for headaches. Negative for dizziness and light-headedness.  All other systems reviewed and are negative.      Objective:  BP 144/82  Pulse 58  Temp(Src) 98 F (36.7 C) (Oral)  Resp 14  Wt 149 lb 8 oz (67.813 kg)  SpO2 98%   Physical Exam  Constitutional: She is oriented to person, place, and time. No distress.  HENT:  Head: Normocephalic and atraumatic.  Eyes: Conjunctivae and EOM are normal.  Neck: Normal range of motion. Neck supple.  Cardiovascular: Normal rate and regular rhythm.   Pulmonary/Chest: Effort normal. No respiratory distress.  Musculoskeletal: Normal range of motion.  Neurological: She is alert and oriented to person, place, and time.  Skin: Skin is warm and dry.  Psychiatric: She has a normal mood and affect. Her behavior is normal. Judgment and thought content normal.      Assessment & Plan:   1. Headache(784.0) Suspect HA is tension/stress related. She will take her OTC HA medication 1-2 tablets every 6  hours for today then tomorrow start every 6 hours as needed. I am adding robaxin 500 mg tid prn. Advised pt to take at least every night x 5 days. She may take up to 3 times as needed. She has scheduled a f/u appt with Dr. Derrel Nip for Monday. I have asked pt to keep the appointment so that she could follow up. She can cancel the appointment if her symptoms completely resolve.

## 2014-02-09 NOTE — Patient Instructions (Signed)
  Take you over the counter headache medication 2 tablets every 6 hours today. Then tomorrow take it every 6 hours as needed. Take with hot tea.  Start Robaxin 500 mg at bedtime for 5 days to help relax your muscles.  Continue swimming to help continue to relax muscles.  If you are not any better by Monday please keep your appointment to see Dr. Derrel Nip.

## 2014-02-09 NOTE — Progress Notes (Signed)
Pre visit review using our clinic review tool, if applicable. No additional management support is needed unless otherwise documented below in the visit note. 

## 2014-02-14 ENCOUNTER — Telehealth: Payer: Self-pay | Admitting: Internal Medicine

## 2014-02-14 ENCOUNTER — Ambulatory Visit (INDEPENDENT_AMBULATORY_CARE_PROVIDER_SITE_OTHER): Payer: Medicare Other | Admitting: Internal Medicine

## 2014-02-14 ENCOUNTER — Encounter: Payer: Self-pay | Admitting: Internal Medicine

## 2014-02-14 VITALS — BP 148/80 | HR 79 | Temp 97.7°F | Resp 16 | Ht 67.5 in | Wt 145.2 lb

## 2014-02-14 DIAGNOSIS — E039 Hypothyroidism, unspecified: Secondary | ICD-10-CM | POA: Diagnosis not present

## 2014-02-14 DIAGNOSIS — R5381 Other malaise: Secondary | ICD-10-CM

## 2014-02-14 DIAGNOSIS — G44011 Episodic cluster headache, intractable: Secondary | ICD-10-CM

## 2014-02-14 DIAGNOSIS — G44019 Episodic cluster headache, not intractable: Secondary | ICD-10-CM

## 2014-02-14 DIAGNOSIS — R51 Headache: Secondary | ICD-10-CM

## 2014-02-14 DIAGNOSIS — R5383 Other fatigue: Secondary | ICD-10-CM | POA: Diagnosis not present

## 2014-02-14 DIAGNOSIS — E538 Deficiency of other specified B group vitamins: Secondary | ICD-10-CM

## 2014-02-14 LAB — COMPREHENSIVE METABOLIC PANEL
ALBUMIN: 4.6 g/dL (ref 3.5–5.2)
ALK PHOS: 56 U/L (ref 39–117)
ALT: 21 U/L (ref 0–35)
AST: 26 U/L (ref 0–37)
BUN: 13 mg/dL (ref 6–23)
CO2: 30 mEq/L (ref 19–32)
Calcium: 10 mg/dL (ref 8.4–10.5)
Chloride: 103 mEq/L (ref 96–112)
Creatinine, Ser: 0.8 mg/dL (ref 0.4–1.2)
GFR: 79.82 mL/min (ref >60.00)
Glucose, Bld: 102 mg/dL — ABNORMAL HIGH (ref 70–99)
POTASSIUM: 4.1 meq/L (ref 3.5–5.1)
SODIUM: 142 meq/L (ref 135–145)
TOTAL PROTEIN: 7.8 g/dL (ref 6.0–8.3)
Total Bilirubin: 1 mg/dL (ref 0.2–1.2)

## 2014-02-14 LAB — TSH: TSH: 2.05 u[IU]/mL (ref 0.35–4.50)

## 2014-02-14 LAB — CBC WITH DIFFERENTIAL/PLATELET
BASOS ABS: 0 10*3/uL (ref 0.0–0.1)
BASOS PCT: 0.2 % (ref 0.0–3.0)
EOS ABS: 0 10*3/uL (ref 0.0–0.7)
Eosinophils Relative: 0.5 % (ref 0.0–5.0)
HEMATOCRIT: 44.7 % (ref 36.0–46.0)
HEMOGLOBIN: 14.6 g/dL (ref 12.0–15.0)
LYMPHS ABS: 1.3 10*3/uL (ref 0.7–4.0)
Lymphocytes Relative: 18 % (ref 12.0–46.0)
MCHC: 32.7 g/dL (ref 30.0–36.0)
MCV: 96.6 fl (ref 78.0–100.0)
Monocytes Absolute: 0.5 10*3/uL (ref 0.1–1.0)
Monocytes Relative: 6.6 % (ref 3.0–12.0)
NEUTROS ABS: 5.4 10*3/uL (ref 1.4–7.7)
Neutrophils Relative %: 74.7 % (ref 43.0–77.0)
Platelets: 195 10*3/uL (ref 150.0–400.0)
RBC: 4.62 Mil/uL (ref 3.87–5.11)
RDW: 13.5 % (ref 11.5–15.5)
WBC: 7.3 10*3/uL (ref 4.0–10.5)

## 2014-02-14 LAB — C-REACTIVE PROTEIN: CRP: <0.5 mg/dL (ref 0.5–20.0)

## 2014-02-14 LAB — VITAMIN B12: Vitamin B-12: 225 pg/mL (ref 211–911)

## 2014-02-14 LAB — SEDIMENTATION RATE: Sed Rate: 9 mm/hr (ref 0–22)

## 2014-02-14 MED ORDER — PREDNISONE 10 MG PO TABS: ORAL_TABLET | ORAL | Status: DC

## 2014-02-14 MED ORDER — TRAMADOL HCL 50 MG PO TABS: 50.0000 mg | ORAL_TABLET | Freq: Three times a day (TID) | ORAL | Status: DC | PRN

## 2014-02-14 NOTE — Telephone Encounter (Signed)
The patient's husband came in wanting lab results on his wife . Her labs were done this morning . He stated he would be fine with receiving them on my chart or a call from Dr. Derrel Nip.

## 2014-02-14 NOTE — Telephone Encounter (Signed)
Labs are not back yet,  And I cannot release them directly to him without a release from her.  But I will use Mychart to notify her so she can share them with him then.

## 2014-02-14 NOTE — Progress Notes (Signed)
Pre-visit discussion using our clinic review tool. No additional management support is needed unless otherwise documented below in the visit note.  

## 2014-02-14 NOTE — Progress Notes (Signed)
Patient ID: Samantha Hanna, female   DOB: [birth year], 73 y.o.   MRN: [id number]  Patient Active Problem List   Diagnosis Date Noted  . B12 deficiency 02/15/2014  . Headache(784.0) 02/09/2014  . Irritable bowel syndrome 09/08/2013  . Routine general medical examination at a health care facility 07/15/2012  . Unspecified hypothyroidism 07/15/2012  . Statin intolerance 07/14/2012  . Screening for breast cancer 07/11/2011  . Screening for colon cancer 07/11/2011    Subjective:  CC:   Chief Complaint  Patient presents with  . Follow-up    were better for a while but now feels like having flu type symptoms.  . Headache    HPI:   Samantha Hanna is a 73 y.o. female who presents for Follow up on new onset daily headaches,  Seen by RR last week and treated with muscle relaxer for nighttime use .  Her headaches initially improved for a few days but have  returned and she feels no better.  Feels actually worse than last week.  "i just don't feel good."  Feels likeshe is having  migraines with aura.  Some relief with OTC meds for migraines asa/tylenol combo.  Took the MR as directed every night.  Having altered bowel movements which has resolved.  Description of headache: occurring sometimes daily ,  Sometimes mild, sometimes severe.  In the last 3 weeks they have been severe. Started early February after several episodes of severe sinusitis in the winter. Had a sinus CT by Nadeen Landau on Jun 5 and was reportedly normal. Has been in the pool a lot. Wonders if the change to generic Synthroid has anything to do with the onset of headaches since this occurred in February.  Feels light headed.  Has left sided muscle tension in the neck but not described as neck pain and no radiation to either arm.  No shooting pains in occiptal area to suggest radiculopathy.  Headache occurs mostly in the temples but also in a band around the head to the occiput.  Temples are not tender .  No appetite, and feels  full all the  time.  Not dehydrated,  drinking lots of fluids. Joints have felt achey. No known tick bites but works in her flower garden. No recent travel outside state or even to the mountains or beach.  This morning had an episode of severe indigestion ,  Finally had a protracted belch and felt immediately better..    Past Medical History  Diagnosis Date  . Hyperlipidemia   . Thyroid disease     Past Surgical History  Procedure Laterality Date  . Melanoma excision      x 2, Dr. Sharlett Iles       The following portions of the patient's history were reviewed and updated as appropriate: Allergies, current medications, and problem list.    Review of Systems:   Patient denies headache, fevers, malaise, unintentional weight loss, skin rash, eye pain, sinus congestion and sinus pain, sore throat, dysphagia,  hemoptysis , cough, dyspnea, wheezing, chest pain, palpitations, orthopnea, edema, abdominal pain, nausea, melena, diarrhea, constipation, flank pain, dysuria, hematuria, urinary  Frequency, nocturia, numbness, tingling, seizures,  Focal weakness, Loss of consciousness,  Tremor, insomnia, depression, anxiety, and suicidal ideation.     History   Social History  . Marital Status: Married    Spouse Name: N/A    Number of Children: N/A  . Years of Education: N/A   Occupational History  . Not on file.   Social History  Main Topics  . Smoking status: Never Smoker   . Smokeless tobacco: Never Used  . Alcohol Use: 1.5 oz/week    3 drink(s) per week  . Drug Use: No  . Sexual Activity: Not on file   Other Topics Concern  . Not on file   Social History Narrative  . No narrative on file    Objective:  Filed Vitals:   02/14/14 0842  BP: 148/80  Pulse: 79  Temp: 97.7 F (36.5 C)  Resp: 16     General appearance: alert, cooperative and appears stated age Ears: normal TM's and external ear canals both ears Throat: lips, mucosa, and tongue normal; teeth and gums normal Neck: no  adenopathy, no carotid bruit, supple, symmetrical, trachea midline and thyroid not enlarged, symmetric, no tenderness/mass/nodules Back: symmetric, no curvature. ROM normal. No CVA tenderness. Lungs: clear to auscultation bilaterally Heart: regular rate and rhythm, S1, S2 normal, no murmur, click, rub or gallop Abdomen: soft, non-tender; bowel sounds normal; no masses,  no organomegaly Pulses: 2+ and symmetric Skin: Skin color, texture, turgor normal. No rashes or lesions Lymph nodes: Cervical, supraclavicular, and axillary nodes normal.  Assessment and Plan:  Unspecified hypothyroidism Thyroid function is normal on current dose, but will resume name brand only Synthroid since headache onset coincided with change to generic.  B12 deficiency Starting home In injections for B12 level of 225 Lab Results  Component Value Date   VITAMINB12 225 02/14/2014     Headache(784.0) Etiology unclear. Serologies for tick borne illnesses are still pending but CMET, CBC ERS and CRP all reassuringly normal that vasculiutis and infection are less likely .  Will consider plain films of cervical spine and MRI brain if change to name brand synthroid and B12 supplementation do not resolve daily occurrence.,    Updated Medication List Outpatient Encounter Prescriptions as of 02/14/2014  Medication Sig  . cholecalciferol (VITAMIN D) 1000 UNITS tablet Take 1,000 Units by mouth daily.    . fish oil-omega-3 fatty acids 1000 MG capsule Take 2 g by mouth daily.    . methocarbamol (ROBAXIN) 500 MG tablet Take 1 tablet (500 mg total) by mouth 3 (three) times daily as needed for muscle spasms.  . Probiotic Product (PROBIOTIC DAILY PO) Take 1 tablet by mouth daily.  . [DISCONTINUED] levothyroxine (SYNTHROID, LEVOTHROID) 75 MCG tablet Take 1 tablet (75 mcg total) by mouth daily.  . calcium carbonate (OS-CAL) 600 MG TABS Take 600 mg by mouth 2 (two) times daily with a meal.    . co-enzyme Q-10 30 MG capsule Take 30 mg  by mouth 3 (three) times daily.    . cyanocobalamin (,VITAMIN B-12,) 1000 MCG/ML injection For daily/weekly/monthly use per MD directions for B12 deficiency  . FOLIC ACID PO Take by mouth.    . predniSONE (DELTASONE) 10 MG tablet 6 tablets on Days 1 and 2  Reduce by 1 tablet every 2 days until gone  . SYNTHROID 75 MCG tablet Take 1 tablet (75 mcg total) by mouth daily before breakfast.  . Syringe, Disposable, 1 ML MISC For daily/weekly/monthly B12 injections  . traMADol (ULTRAM) 50 MG tablet Take 1 tablet (50 mg total) by mouth every 8 (eight) hours as needed.     Orders Placed This Encounter  Procedures  . Sedimentation rate  . Comprehensive metabolic panel  . ANA w/Reflex if Positive  . C-reactive protein  . Lyme Juliette Alcide. Blt. IgG & IgM w/bands  . Rocky mtn spotted fvr abs pnl(IgG+IgM)  . Ehrlichia  antibody panel  . CBC with Differential  . Vitamin B12  . TSH    No Follow-up on file.

## 2014-02-14 NOTE — Telephone Encounter (Signed)
I do not see a release of information that husband can receive results. Please advise.

## 2014-02-14 NOTE — Patient Instructions (Addendum)
I am repeating your thyroid functio ntoday along with several other labs to look for signs of inflammation  I will fill Your Synthroid with name brand formulation once I see your thyroid test  Tramadol 50 mg every 6 hours as needed for pain   Prednisone taper over 12 days

## 2014-02-15 ENCOUNTER — Encounter: Payer: Self-pay | Admitting: Internal Medicine

## 2014-02-15 DIAGNOSIS — E538 Deficiency of other specified B group vitamins: Secondary | ICD-10-CM | POA: Insufficient documentation

## 2014-02-15 HISTORY — DX: Deficiency of other specified B group vitamins: E53.8

## 2014-02-15 LAB — LYME ABY, WSTRN BLT IGG & IGM W/BANDS
B BURGDORFERI IGG ABS (IB): NEGATIVE
B BURGDORFERI IGM ABS (IB): NEGATIVE
LYME DISEASE 23 KD IGG: NONREACTIVE
LYME DISEASE 23 KD IGM: NONREACTIVE
LYME DISEASE 30 KD IGG: NONREACTIVE
LYME DISEASE 39 KD IGM: NONREACTIVE
LYME DISEASE 41 KD IGM: NONREACTIVE
LYME DISEASE 45 KD IGG: NONREACTIVE
Lyme Disease 18 kD IgG: NONREACTIVE
Lyme Disease 28 kD IgG: NONREACTIVE
Lyme Disease 39 kD IgG: NONREACTIVE
Lyme Disease 41 kD IgG: NONREACTIVE
Lyme Disease 58 kD IgG: NONREACTIVE
Lyme Disease 66 kD IgG: NONREACTIVE
Lyme Disease 93 kD IgG: NONREACTIVE

## 2014-02-15 LAB — ANA W/REFLEX IF POSITIVE
Anti Nuclear Antibody(ANA): POSITIVE — AB
Centromere Ab Screen: <0.2 AI (ref 0.0–0.9)
Chromatin Ab SerPl-aCnc: <0.2 AI (ref 0.0–0.9)
ENA RNP Ab: 0.2 AI (ref 0.0–0.9)
ENA SSB (LA) Ab: 1.1 AI — ABNORMAL HIGH (ref 0.0–0.9)
Scleroderma SCL-70: <0.2 AI (ref 0.0–0.9)
dsDNA Ab: <1 IU/mL (ref 0–9)

## 2014-02-15 MED ORDER — SYNTHROID 75 MCG PO TABS
75.0000 ug | ORAL_TABLET | Freq: Every day | ORAL | Status: DC
Start: 2014-02-15 — End: 2014-07-18

## 2014-02-15 MED ORDER — SYRINGE (DISPOSABLE) 1 ML MISC: Status: DC

## 2014-02-15 MED ORDER — CYANOCOBALAMIN 1000 MCG/ML IJ SOLN: INTRAMUSCULAR | Status: DC

## 2014-02-15 NOTE — Assessment & Plan Note (Signed)
Starting home In injections for B12 level of 225 Lab Results  Component Value Date   VITAMINB12 225 02/14/2014

## 2014-02-15 NOTE — Assessment & Plan Note (Signed)
Thyroid function is normal on current dose, but will resume name brand only Synthroid since headache onset coincided with change to generic.

## 2014-02-15 NOTE — Assessment & Plan Note (Signed)
Etiology unclear. Serologies for tick borne illnesses are still pending but CMET, CBC ERS and CRP all reassuringly normal that vasculiutis and infection are less likely .  Will consider plain films of cervical spine and MRI brain if change to name brand synthroid and B12 supplementation do not resolve daily occurrence.,

## 2014-02-15 NOTE — Telephone Encounter (Signed)
Patient notified and voiced understanding.

## 2014-02-16 ENCOUNTER — Encounter: Payer: Self-pay | Admitting: Internal Medicine

## 2014-02-16 LAB — ROCKY MTN SPOTTED FVR ABS PNL(IGG+IGM)
RMSF IgG: 0.12 IV
RMSF IgM: 0.24 IV

## 2014-02-17 ENCOUNTER — Encounter: Payer: Self-pay | Admitting: Internal Medicine

## 2014-02-18 LAB — EHRLICHIA ANTIBODY PANEL: E chaffeensis (HGE) Ab, IgM: <1:20 {titer}

## 2014-02-19 ENCOUNTER — Encounter: Payer: Self-pay | Admitting: Internal Medicine

## 2014-03-10 DIAGNOSIS — IMO0002 Reserved for concepts with insufficient information to code with codable children: Secondary | ICD-10-CM | POA: Diagnosis not present

## 2014-03-10 DIAGNOSIS — M5412 Radiculopathy, cervical region: Secondary | ICD-10-CM | POA: Diagnosis not present

## 2014-03-14 ENCOUNTER — Encounter: Payer: Self-pay | Admitting: Neurology

## 2014-03-14 ENCOUNTER — Ambulatory Visit (INDEPENDENT_AMBULATORY_CARE_PROVIDER_SITE_OTHER): Payer: Medicare Other | Admitting: Neurology

## 2014-03-14 VITALS — BP 150/88 | HR 79 | Ht 67.32 in | Wt 148.2 lb

## 2014-03-14 DIAGNOSIS — R519 Headache, unspecified: Secondary | ICD-10-CM

## 2014-03-14 DIAGNOSIS — R51 Headache: Secondary | ICD-10-CM

## 2014-03-14 DIAGNOSIS — H539 Unspecified visual disturbance: Secondary | ICD-10-CM | POA: Diagnosis not present

## 2014-03-14 MED ORDER — NORTRIPTYLINE HCL 10 MG PO CAPS: ORAL_CAPSULE | ORAL | Status: DC

## 2014-03-14 NOTE — Progress Notes (Signed)
Note faxed.

## 2014-03-14 NOTE — Progress Notes (Signed)
[facility] Clinic Note - Initial Visit   Date: 03/14/2014  Samantha Hanna MRN: [id number] DOB: 11-Jul-1941   Dear Dr. Dimas Aguas:   Thank you for your kind referral of Samantha Hanna for consultation of headaches. Although her history is well known to you, please allow [place] to reiterate it for the purpose of our medical record. The patient was accompanied to the clinic by self.   History of Present Illness: Samantha Hanna is a 73 y.o. right-handed Caucasian female with history of irritable bowel syndrome, hypothyroidism, hyperlipidemia, and vitamin B12 deficiency presenting for evaluation of headaches.    Since May 2015, she feels that her energy level has been very low.  She initially attributed it to having a lot of company visiting her and being overwhelmed.  She saw PCP who performed blood work in June which was notable for vitamin B12 deficiency.  She has since start vitamin B12 injections which has helped.  She had sensation overall "not feeling well" especially with generalized malaise, joint pain, and headaches.  She feels best when she is able to swim or do water exercises.  She also takes meloxicam for joint pain that helps.    Starting around late June, she developed dull headaches, mostly bitemporal.  She has associated blurry vision around the peripheral of her eyes.  Eye examination was normal and did not show changes in acuity.  Pain has become daily over the last week, and limits her from doing her usual activity.  She has associated photophobia and photophobia.  No nausea/vomiting.  Pain is ranked as 6/10. It generally lasts 4-5hr to all day.   Headaches occasionally wake her up from sleeping.  Positional changes, such as bending, does not exacerbate pain.   She has tried excedrin, but is not taking any medications routinely.    She has no personal history of headaches or family history of migraines.  Out-side paper records, electronic medical record, and  images have been reviewed where available and summarized as:   Labs 02/14/2014:  ESR 9, ANA positive, SSB 1.1*, dsDNA neg, vitamin B12 225*, TSH 2.05   Past Medical History  Diagnosis Date  . Hyperlipidemia   . Thyroid disease     Past Surgical History  Procedure Laterality Date  . Melanoma excision      x 2, Dr. Jarold Motto     Medications:  Current Outpatient Prescriptions on File Prior to Visit  Medication Sig Dispense Refill  . calcium carbonate (OS-CAL) 600 MG TABS Take 600 mg by mouth 2 (two) times daily with a meal.        . cholecalciferol (VITAMIN D) 1000 UNITS tablet Take 1,000 Units by mouth daily.        Marland Kitchen co-enzyme Q-10 30 MG capsule Take 30 mg by mouth 3 (three) times daily.        . cyanocobalamin (,VITAMIN B-12,) 1000 MCG/ML injection For daily/weekly/monthly use per MD directions for B12 deficiency  10 mL  3  . fish oil-omega-3 fatty acids 1000 MG capsule Take 2 g by mouth daily.        Marland Kitchen FOLIC ACID PO Take by mouth.        . methocarbamol (ROBAXIN) 500 MG tablet Take 1 tablet (500 mg total) by mouth 3 (three) times daily as needed for muscle spasms.  30 tablet  0  . Probiotic Product (PROBIOTIC DAILY PO) Take 1 tablet by mouth daily.      Marland Kitchen SYNTHROID 75 MCG  tablet Take 1 tablet (75 mcg total) by mouth daily before breakfast.  90 tablet  1  . Syringe, Disposable, 1 ML MISC For daily/weekly/monthly B12 injections  25 each  2  . traMADol (ULTRAM) 50 MG tablet Take 1 tablet (50 mg total) by mouth every 8 (eight) hours as needed.  30 tablet  0  . predniSONE (DELTASONE) 10 MG tablet 6 tablets on Days 1 and 2  Reduce by 1 tablet every 2 days until gone  12 tablet  0   No current facility-administered medications on file prior to visit.    Allergies:  Allergies  Allergen Reactions  . Statins   . Penicillins Rash    Family History: Family History  Problem Relation Age of Onset  . Osteoporosis Mother     Deceased, 91  . Coronary artery disease Father 55     Deceased, 51 in MVA  . Arthritis Daughter 59    Rheumatiod  . Healthy Daughter     Social History: History   Social History  . Marital Status: Married    Spouse Name: N/A    Number of Children: N/A  . Years of Education: N/A   Occupational History  . Not on file.   Social History Main Topics  . Smoking status: Never Smoker   . Smokeless tobacco: Never Used  . Alcohol Use: 1.5 oz/week    3 drink(s) per week  . Drug Use: No  . Sexual Activity: Yes   Other Topics Concern  . Not on file   Social History Narrative   She lives with husband (optometrist) who has Parkinson's disease.  They have two grown children.   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    Highest level of education:  Bachelor's    Review of Systems:  CONSTITUTIONAL: No fevers, chills, night sweats, or weight loss.   EYES: + visual changes or eye pain ENT: No hearing changes.  No history of nose bleeds.   RESPIRATORY: No cough, wheezing and shortness of breath.   CARDIOVASCULAR: Negative for chest pain, and palpitations.   GI: Negative for abdominal discomfort, blood in stools or black stools.  No recent change in bowel habits.   GU:  No history of incontinence.   MUSCLOSKELETAL: No history of joint pain or swelling.  No myalgias.   SKIN: Negative for lesions, rash, and itching.   HEMATOLOGY/ONCOLOGY: Negative for prolonged bleeding, bruising easily, and swollen nodes ENDOCRINE: Negative for cold or heat intolerance, polydipsia or goiter.   PSYCH:  No depression or anxiety symptoms.   NEURO: As Above.   Vital Signs:  BP 150/88  Pulse 79  Ht 5' 7.32" (1.71 m)  Wt 148 lb 3 oz (67.217 kg)  BMI 22.99 kg/m2  SpO2 98% Pain Scale: 5 on a scale of 0-10   General Medical Exam:   General:  Well appearing, comfortable.   Eyes/ENT: see cranial nerve examination.   Neck: No masses appreciated.  Full range of motion without tenderness.  No carotid bruits. Respiratory:   Clear to auscultation, good air entry bilaterally.   Cardiac:  Regular rate and rhythm, no murmur.    Extremities:  No deformities, edema, or skin discoloration. Good capillary refill.   Skin:  Skin color, texture, turgor normal. No rashes or lesions.  Neurological Exam: MENTAL STATUS including orientation to time, place, person, recent and remote memory, attention span and concentration, language, and fund of knowledge is normal.  Speech is  not dysarthric.  CRANIAL NERVES: II:  No visual field defects.  Unremarkable fundi.   III-IV-VI: Pupils equal round and reactive to light.  Normal conjugate, extra-ocular eye movements in all directions of gaze.  No nystagmus.  No Horner's.   V:  Normal facial sensation.     VII:  Normal facial symmetry and movements.   VIII:  Normal hearing and vestibular function.   IX-X:  Normal palatal movement.   XI:  Normal shoulder shrug and head rotation.   XII:  Normal tongue strength and range of motion, no deviation or fasciculation.  MOTOR:  No atrophy, fasciculations or abnormal movements.  No pronator drift.  Tone is normal.    Right Upper Extremity:    Left Upper Extremity:    Deltoid  5/5   Deltoid  5/5   Biceps  5/5   Biceps  5/5   Triceps  5/5   Triceps  5/5   Wrist extensors  5/5   Wrist extensors  5/5   Wrist flexors  5/5   Wrist flexors  5/5   Finger extensors  5/5   Finger extensors  5/5   Finger flexors  5/5   Finger flexors  5/5   Dorsal interossei  5/5   Dorsal interossei  5/5   Abductor pollicis  5/5   Abductor pollicis  5/5   Tone (Ashworth scale)  0  Tone (Ashworth scale)  0   Right Lower Extremity:    Left Lower Extremity:    Hip flexors  5/5   Hip flexors  5/5   Hip extensors  5/5   Hip extensors  5/5   Knee flexors  5/5   Knee flexors  5/5   Knee extensors  5/5   Knee extensors  5/5   Dorsiflexors  5/5   Dorsiflexors  5/5   Plantarflexors  5/5   Plantarflexors  5/5   Toe extensors  5/5   Toe extensors  5/5   Toe flexors  5/5    Toe flexors  5/5   Tone (Ashworth scale)  0  Tone (Ashworth scale)  0   MSRs:  Right                                                                 Left brachioradialis 2+  brachioradialis 2+  biceps 2+  biceps 2+  triceps 2+  triceps 2+  patellar 2+  patellar 2+  ankle jerk 2+  ankle jerk 2+  Hoffman no  Hoffman no  plantar response down  plantar response down   SENSORY:  Normal and symmetric perception of light touch, pinprick, vibration, and proprioception.  Romberg's sign absent.   COORDINATION/GAIT: Normal finger-to- nose-finger and heel-to-shin.  Intact rapid alternating movements bilaterally.  Able to rise from a chair without using arms.  Gait narrow based and stable. Tandem and stressed gait intact.    IMPRESSION: Mrs. Forner is a delightful 73 year-old female presenting for evaluation of new onset of bitemporal or headaches. Headaches started with subacute course and has worsened to become daily headaches for at least the past several weeks. She has associated vision changes, photophobia, and phonophobia. Her neurological examination is entirely normal and nonfocal, I would like to obtain imaging of the brain to look  for any structural disease, especially since she complains of vision changes, headaches waking her up from sleeping, and she has no prior history of headaches.  She has normal sedimentation, making giant cell arteritis unlikely.  In the meantime I would like to start her on preventative medication for symptomatic control of possible chronic daily headache.   PLAN/RECOMMENDATIONS:  1.  For preventative therapy, start taking nortriptyline 10mg  at bedtime for two weeks, then increase to 20mg  (2 tablets) at bedtime. 2.  For rescue therapy, take Aleve or ibuprofen for severe headache.  She has been instructed to avoid taking more than twice per week to prevent medication overuse headaches. 3.  MRI brain wwo contrast 4.  Return to clinic in 6 weeks.    The duration of  this appointment visit was 45 minutes of face-to-face time with the patient.  Greater than 50% of this time was spent in counseling, explanation of diagnosis, planning of further management, and coordination of care.   Thank you for allowing me to participate in patient's care.  If I can answer any additional questions, I would be pleased to do so.    Sincerely,    Donika K. Posey Pronto, DO

## 2014-03-14 NOTE — Patient Instructions (Addendum)
1.  For preventative therapy, start taking nortriptyline 10mg  at bedtime for two weeks.  If tolerating, increase to 20mg  (2 tablets) at bedtime. 2.  For rescue, take Aleve or ibuprofen for severe headache.  You may also combine this with benadryl 25mg  for severe pain.  Do not take rescue medications more than twice per week. 3.  MRI brain wwo contrast 4.  Return to clinic in 6 weeks.

## 2014-03-16 ENCOUNTER — Ambulatory Visit: Payer: Self-pay | Admitting: Neurology

## 2014-03-16 DIAGNOSIS — R51 Headache: Secondary | ICD-10-CM | POA: Diagnosis not present

## 2014-03-16 DIAGNOSIS — H539 Unspecified visual disturbance: Secondary | ICD-10-CM | POA: Diagnosis not present

## 2014-03-17 ENCOUNTER — Telehealth: Payer: Self-pay | Admitting: Neurology

## 2014-03-17 NOTE — Telephone Encounter (Signed)
Pt husband would like the results of the MRI please call [phone] or [phone]

## 2014-03-18 NOTE — Telephone Encounter (Signed)
Results are on your desk.

## 2014-03-18 NOTE — Telephone Encounter (Signed)
MRI brain with and without contrast report from [facility] received dated 03/16/2014:  Negative for acute infarct. Chronic ischemic changes are present. Question recent infarct in the right occipital parietal lobe which is no longer positive on diffusion.  Called and notified the patient with the results of this test. Patient is doing much better after starting the nortriptyline 10 mg at bedtime and headaches nearly resolved. If headaches worsen or she develops any new neurological symptoms, vessel imaging can be done, however at this time, suspicion for a stroke causing her current symptomology is very low.  Donika K. Posey Pronto, DO

## 2014-04-07 ENCOUNTER — Encounter: Payer: Self-pay | Admitting: Neurology

## 2014-04-07 ENCOUNTER — Telehealth: Payer: Self-pay | Admitting: Internal Medicine

## 2014-04-26 ENCOUNTER — Ambulatory Visit (INDEPENDENT_AMBULATORY_CARE_PROVIDER_SITE_OTHER): Payer: Medicare Other | Admitting: Neurology

## 2014-04-26 ENCOUNTER — Encounter: Payer: Self-pay | Admitting: Neurology

## 2014-04-26 VITALS — BP 130/84 | HR 79 | Ht 67.0 in | Wt 150.2 lb

## 2014-04-26 DIAGNOSIS — R519 Headache, unspecified: Secondary | ICD-10-CM

## 2014-04-26 DIAGNOSIS — R51 Headache: Secondary | ICD-10-CM

## 2014-04-26 NOTE — Progress Notes (Signed)
Follow-up Visit   Date: 04/26/2014    Samantha Hanna MRN: [id number] DOB: Nov 30, 1940   Interim History: Samantha Hanna is a 73 y.o. right-handed Caucasian female with history of irritable bowel syndrome, hypothyroidism, hyperlipidemia, and vitamin B12 deficiency returning to the clinic for follow-up of headaches.  The patient was accompanied to the clinic by self.  History of present illness: Since May 2015, she feels that her energy level has been very low. She initially attributed it to having a lot of company visiting her and being overwhelmed. She saw PCP who performed blood work in June which was notable for vitamin B12 deficiency. She has since start vitamin B12 injections which has helped. She had sensation overall "not feeling well" especially with generalized malaise, joint pain, and headaches. She feels best when she is able to swim or do water exercises. She also takes meloxicam for joint pain that helps.   Starting around late June, she developed dull headaches, mostly bitemporal. She has associated blurry vision around the peripheral of her eyes. Eye examination was normal and did not show changes in acuity. Pain has become daily over the last week, and limits her from doing her usual activity. She has associated photophobia and photophobia. No nausea/vomiting. Pain is ranked as 6/10. It generally lasts 4-5hr to all day. Headaches occasionally wake her up from sleeping. Positional changes, such as bending, does not exacerbate pain. She has tried excedrin, but is not taking any medications routinely.   She has no personal history of headaches or family history of migraines.   UPDATE 03/14/2014:  At her last visit, MRI of the brain was ordered and showed chronic ischemic changes.  She started notriptyline 20mg  and noticed complete resolution of her pain and vision symptoms.  She is also sleeping much better.  She continues to have mild neck problems, but that is also improved.  She is  also taking reduced amount of Mobic.  No new complaints.   Medications:  Current Outpatient Prescriptions on File Prior to Visit  Medication Sig Dispense Refill  . calcium carbonate (OS-CAL) 600 MG TABS Take 600 mg by mouth 2 (two) times daily with a meal.        . cholecalciferol (VITAMIN D) 1000 UNITS tablet Take 1,000 Units by mouth daily.        Marland Kitchen co-enzyme Q-10 30 MG capsule Take 30 mg by mouth 3 (three) times daily.        . cyanocobalamin (,VITAMIN B-12,) 1000 MCG/ML injection For daily/weekly/monthly use per MD directions for B12 deficiency  10 mL  3  . fish oil-omega-3 fatty acids 1000 MG capsule Take 2 g by mouth daily.        . fluticasone (FLONASE) 50 MCG/ACT nasal spray       . FOLIC ACID PO Take by mouth.        . meloxicam (MOBIC) 15 MG tablet       . methocarbamol (ROBAXIN) 500 MG tablet Take 1 tablet (500 mg total) by mouth 3 (three) times daily as needed for muscle spasms.  30 tablet  0  . nortriptyline (PAMELOR) 10 MG capsule Take 1 tablet at bedtime x 2 weeks, then increase to 2 tab at bedtime.  60 capsule  3  . predniSONE (DELTASONE) 10 MG tablet 6 tablets on Days 1 and 2  Reduce by 1 tablet every 2 days until gone  12 tablet  0  . Probiotic Product (PROBIOTIC DAILY PO) Take 1 tablet  by mouth daily.      Marland Kitchen SYNTHROID 75 MCG tablet Take 1 tablet (75 mcg total) by mouth daily before breakfast.  90 tablet  1  . Syringe, Disposable, 1 ML MISC For daily/weekly/monthly B12 injections  25 each  2  . traMADol (ULTRAM) 50 MG tablet Take 1 tablet (50 mg total) by mouth every 8 (eight) hours as needed.  30 tablet  0   No current facility-administered medications on file prior to visit.    Allergies:  Allergies  Allergen Reactions  . Statins   . Penicillins Rash     Review of Systems:  CONSTITUTIONAL: No fevers, chills, night sweats, or weight loss.  EYES: No visual changes or eye pain ENT: No hearing changes.  No history of nose bleeds.   RESPIRATORY: No cough, wheezing  and shortness of breath.   CARDIOVASCULAR: Negative for chest pain, and palpitations.   GI: Negative for abdominal discomfort, blood in stools or black stools.  No recent change in bowel habits.   GU:  No history of incontinence.   MUSCLOSKELETAL: No history of joint pain or swelling.  No myalgias.   SKIN: Negative for lesions, rash, and itching.   ENDOCRINE: Negative for cold or heat intolerance, polydipsia or goiter.   PSYCH:  No depression or anxiety symptoms.   NEURO: As Above.   Vital Signs:  BP 130/84  Pulse 79  Ht 5\' 7"  (1.702 m)  Wt 150 lb 4 oz (68.153 kg)  BMI 23.53 kg/m2  SpO2 99%  Neurological Exam: MENTAL STATUS including orientation to time, place, person, recent and remote memory, attention span and concentration, language, and fund of knowledge is normal.  Speech is not dysarthric.  CRANIAL NERVES: No visual field defects. Pupils equal round and reactive to light.  Normal conjugate, extra-ocular eye movements in all directions of gaze.  No ptosis. Normal facial sensation.  Face is symmetric. Palate elevates symmetrically.  Tongue is midline.  MOTOR:  Motor strength is 5/5 in all extremities  MSRs:  Reflexes are 2+/4 throughout  COORDINATION/GAIT:  Normal finger-to- nose-finger.  Gait narrow based and stable.   Data: MRI brain with and without contrast report from [facility] received dated 03/16/2014: Negative for acute infarct. Chronic ischemic changes are present. Question recent infarct in the right occipital parietal lobe which is no longer positive on diffusion.   IMPRESSION/PLAN: 1.  Chronic daily headaches  - Clinically much improved, now headache-free  - Continue nortripytline 20mg  daily.  If she is doing well in 29-months, may consider weaning to 10mg , if able to achieve same results with headaches and insomnia at a lower dose. 2.  Return to clinic in 30-months.    The duration of this appointment visit was 15 minutes of face-to-face  time with the patient.  Greater than 50% of this time was spent in counseling, explanation of diagnosis, planning of further management, and coordination of care.   Thank you for allowing me to participate in patient's care.  If I can answer any additional questions, I would be pleased to do so.    Sincerely,    Ellaina Schuler K. Posey Pronto, DO

## 2014-04-26 NOTE — Patient Instructions (Signed)
It was great to see you today and I'm delighted that your headaches are better.   Please continue to take nortriptyline 20mg  at bedtime and I will see you back in 36-months, or sooner as needed.

## 2014-05-19 DIAGNOSIS — Z23 Encounter for immunization: Secondary | ICD-10-CM | POA: Diagnosis not present

## 2014-07-13 ENCOUNTER — Other Ambulatory Visit: Payer: Self-pay | Admitting: Neurology

## 2014-07-13 NOTE — Telephone Encounter (Signed)
Rx sent 

## 2014-07-18 ENCOUNTER — Other Ambulatory Visit: Payer: Self-pay | Admitting: Internal Medicine

## 2014-08-17 ENCOUNTER — Encounter: Payer: Self-pay | Admitting: *Deleted

## 2014-10-07 DIAGNOSIS — Z85828 Personal history of other malignant neoplasm of skin: Secondary | ICD-10-CM | POA: Diagnosis not present

## 2014-10-07 DIAGNOSIS — D2261 Melanocytic nevi of right upper limb, including shoulder: Secondary | ICD-10-CM | POA: Diagnosis not present

## 2014-10-07 DIAGNOSIS — D2272 Melanocytic nevi of left lower limb, including hip: Secondary | ICD-10-CM | POA: Diagnosis not present

## 2014-10-07 DIAGNOSIS — Z85820 Personal history of malignant melanoma of skin: Secondary | ICD-10-CM | POA: Diagnosis not present

## 2014-10-12 ENCOUNTER — Other Ambulatory Visit: Payer: Self-pay | Admitting: Neurology

## 2014-10-12 NOTE — Telephone Encounter (Signed)
Rx sent 

## 2014-10-25 ENCOUNTER — Encounter: Payer: Self-pay | Admitting: Neurology

## 2014-10-25 ENCOUNTER — Ambulatory Visit: Payer: Medicare Other | Admitting: Neurology

## 2014-10-25 ENCOUNTER — Ambulatory Visit (INDEPENDENT_AMBULATORY_CARE_PROVIDER_SITE_OTHER): Payer: Medicare Other | Admitting: Neurology

## 2014-10-25 VITALS — BP 120/78 | HR 66 | Ht 67.0 in | Wt 151.6 lb

## 2014-10-25 DIAGNOSIS — R51 Headache: Secondary | ICD-10-CM | POA: Diagnosis not present

## 2014-10-25 DIAGNOSIS — R519 Headache, unspecified: Secondary | ICD-10-CM

## 2014-10-25 NOTE — Patient Instructions (Signed)
It was lovely to see you today.  You are looking well! Continue your medications as you are taking them I will see you back in 69-months, or sooner as needed

## 2014-10-25 NOTE — Progress Notes (Signed)
Follow-up Visit   Date: 10/25/2014    Samantha Hanna MRN: [id number] DOB: 1941-02-15   Interim History: Samantha Hanna is a 74 y.o. right-handed Caucasian female with history of irritable bowel syndrome, hypothyroidism, hyperlipidemia, and vitamin B12 deficiency returning to the clinic for follow-up of headaches.  The patient was accompanied to the clinic by self.  History of present illness: Since May 2015, she feels that her energy level has been very low. She initially attributed it to having a lot of company visiting her and being overwhelmed. She saw PCP who performed blood work in June which was notable for vitamin B12 deficiency. She has since start vitamin B12 injections which has helped. She had sensation overall "not feeling well" especially with generalized malaise, joint pain, and headaches. She feels best when she is able to swim or do water exercises. She also takes meloxicam for joint pain that helps.   Starting around late June, she developed dull headaches, mostly bitemporal. She has associated blurry vision around the peripheral of her eyes. Eye examination was normal and did not show changes in acuity. Pain has become daily over the last week, and limits her from doing her usual activity. She has associated photophobia and photophobia. No nausea/vomiting. Pain is ranked as 6/10. It generally lasts 4-5hr to all day. Headaches occasionally wake her up from sleeping. Positional changes, such as bending, does not exacerbate pain. She has tried excedrin, but is not taking any medications routinely.   She has no personal history of headaches or family history of migraines.  UPDATE 03/14/2014:  At her last visit, MRI of the brain was ordered and showed chronic ischemic changes.  She started notriptyline 20mg  and noticed complete resolution of her pain and vision symptoms.  She is also sleeping much better.  She continues to have mild neck problems, but that is also improved.  She is  also taking reduced amount of Mobic.  No new complaints.  UPDATE 10/25/2014:  She was unable to tolerate nortirptyline 20mg  because after two weeks, she woke up at night "wired".  Since going back down to 10mg  qhs, she has been well.  Her headache are much improved and she only has them if she gets upset and responds to ibuprofen or rest.  No new complaints.   Medications:  Current Outpatient Prescriptions on File Prior to Visit  Medication Sig Dispense Refill  . calcium carbonate (OS-CAL) 600 MG TABS Take 600 mg by mouth 2 (two) times daily with a meal.      . cholecalciferol (VITAMIN D) 1000 UNITS tablet Take 1,000 Units by mouth daily.      Marland Kitchen co-enzyme Q-10 30 MG capsule Take 30 mg by mouth 3 (three) times daily.      . cyanocobalamin (,VITAMIN B-12,) 1000 MCG/ML injection For daily/weekly/monthly use per MD directions for B12 deficiency 10 mL 3  . fish oil-omega-3 fatty acids 1000 MG capsule Take 2 g by mouth daily.      . fluticasone (FLONASE) 50 MCG/ACT nasal spray     . FOLIC ACID PO Take by mouth.      . meloxicam (MOBIC) 15 MG tablet     . methocarbamol (ROBAXIN) 500 MG tablet Take 1 tablet (500 mg total) by mouth 3 (three) times daily as needed for muscle spasms. 30 tablet 0  . nortriptyline (PAMELOR) 10 MG capsule TAKE ONE CAPSULE AT BEDTIME FOR TWO WEEKS THEN INCREASE TO TWO CAPSULES BEDTIME 60 capsule 3  . Probiotic  Product (PROBIOTIC DAILY PO) Take 1 tablet by mouth daily.    Marland Kitchen SYNTHROID 75 MCG tablet TAKE ONE TABLET EVERY DAY BEFORE BREAKFAST 90 tablet 1  . Syringe, Disposable, 1 ML MISC For daily/weekly/monthly B12 injections 25 each 2  . traMADol (ULTRAM) 50 MG tablet Take 1 tablet (50 mg total) by mouth every 8 (eight) hours as needed. 30 tablet 0   No current facility-administered medications on file prior to visit.    Allergies:  Allergies  Allergen Reactions  . Statins   . Penicillins Rash     Review of Systems:  CONSTITUTIONAL: No fevers, chills, night sweats,  or weight loss.  EYES: No visual changes or eye pain ENT: No hearing changes.  No history of nose bleeds.   RESPIRATORY: No cough, wheezing and shortness of breath.   CARDIOVASCULAR: Negative for chest pain, and palpitations.   GI: Negative for abdominal discomfort, blood in stools or black stools.  No recent change in bowel habits.   GU:  No history of incontinence.   MUSCLOSKELETAL: No history of joint pain or swelling.  No myalgias.   SKIN: Negative for lesions, rash, and itching.   ENDOCRINE: Negative for cold or heat intolerance, polydipsia or goiter.   PSYCH:  No depression or anxiety symptoms.   NEURO: As Above.   Vital Signs:  BP 120/78 mmHg  Pulse 66  Ht 5\' 7"  (1.702 m)  Wt 151 lb 9 oz (68.748 kg)  BMI 23.73 kg/m2  SpO2 98%  Neurological Exam: MENTAL STATUS including orientation to time, place, person, recent and remote memory is normal.  Speech is not dysarthric.  CRANIAL NERVES:  Pupils equal round and reactive to light.  Normal conjugate, extra-ocular eye movements in all directions of gaze.  No ptosis. Normal facial sensation.  Face is symmetric. Palate elevates symmetrically.  Tongue is midline.  MOTOR:  Motor strength is 5/5 in all extremities  MSRs:  Reflexes are 2+/4 throughout  COORDINATION/GAIT:  Normal finger-to- nose-finger.  Gait narrow based and stable.   Data: MRI brain with and without contrast report from [facility] received dated 03/16/2014: Negative for acute infarct. Chronic ischemic changes are present. Question recent infarct in the right occipital parietal lobe which is no longer positive on diffusion.   IMPRESSION/PLAN: 1.  Chronic daily headaches  - Clinically doing well  - Continue nortripytline 10mg  daily. 2.  Return to clinic in 84-months.    The duration of this appointment visit was 20 minutes of face-to-face time with the patient.  Greater than 50% of this time was spent in counseling, explanation of diagnosis,  planning of further management, and coordination of care.   Thank you for allowing me to participate in patient's care.  If I can answer any additional questions, I would be pleased to do so.    Sincerely,    Kerensa Nicklas K. Posey Pronto, DO

## 2014-11-01 ENCOUNTER — Ambulatory Visit: Payer: Medicare Other | Admitting: Neurology

## 2014-11-17 ENCOUNTER — Ambulatory Visit: Payer: Medicare Other | Admitting: Internal Medicine

## 2014-11-29 ENCOUNTER — Encounter: Payer: Self-pay | Admitting: Internal Medicine

## 2014-11-29 ENCOUNTER — Ambulatory Visit (INDEPENDENT_AMBULATORY_CARE_PROVIDER_SITE_OTHER): Payer: Medicare Other | Admitting: Internal Medicine

## 2014-11-29 VITALS — BP 140/88 | HR 68 | Temp 97.5°F | Resp 16 | Ht 67.0 in | Wt 151.2 lb

## 2014-11-29 DIAGNOSIS — E538 Deficiency of other specified B group vitamins: Secondary | ICD-10-CM

## 2014-11-29 DIAGNOSIS — G44049 Chronic paroxysmal hemicrania, not intractable: Secondary | ICD-10-CM

## 2014-11-29 DIAGNOSIS — R5383 Other fatigue: Secondary | ICD-10-CM | POA: Diagnosis not present

## 2014-11-29 DIAGNOSIS — E034 Atrophy of thyroid (acquired): Secondary | ICD-10-CM

## 2014-11-29 DIAGNOSIS — E038 Other specified hypothyroidism: Secondary | ICD-10-CM | POA: Diagnosis not present

## 2014-11-29 MED ORDER — CYANOCOBALAMIN 1000 MCG/ML IJ SOLN: INTRAMUSCULAR | Status: DC

## 2014-11-29 MED ORDER — SYRINGE (DISPOSABLE) 1 ML MISC: Status: DC

## 2014-11-29 MED ORDER — SYNTHROID 75 MCG PO TABS: ORAL_TABLET | ORAL | Status: DC

## 2014-11-29 MED ORDER — ALPRAZOLAM 0.25 MG PO TABS: 0.2500 mg | ORAL_TABLET | Freq: Every day | ORAL | Status: DC

## 2014-11-29 NOTE — Patient Instructions (Signed)
I am prescribing a very low dose of alprazolam, (generic for .Xanax) to help you sleep.  Start with 1/2 tablet ,  You can repeat the dose in 30 minutes if it has not helped you relax.  Use only as needed  I encourage you to resume the activities you used to enjoy!     I am checking your thyroid function today.  Do not pick up you thyroid medication until you hear from me about whether there will be a change in dose  You can take your B12 shot more often if it gives you energy  Yo can use your meloxicam AS NEEDED for muscle or joint pain

## 2014-11-29 NOTE — Progress Notes (Signed)
Patient ID: Samantha Hanna, female   DOB: 07/19/1941, 73 y.o.   MRN: [id number]  Patient Active Problem List   Diagnosis Date Noted  . Caregiver with fatigue 12/01/2014  . B12 deficiency 02/15/2014  . Headache 02/09/2014  . Irritable bowel syndrome 09/08/2013  . Routine general medical examination at a health care facility 07/15/2012  . Hypothyroidism 07/15/2012  . Statin intolerance 07/14/2012  . Screening for breast cancer 07/11/2011  . Screening for colon cancer 07/11/2011    Subjective:  CC:   Chief Complaint  Patient presents with  . Follow-up    on thyroid patient is fasting and on B 12    HPI:   Samantha Hanna is a 74 y.o. female who presents for  FOLLOW UP ON fatigue and recurrent headaches.  She states that the headaches which have been occurring in early AM and middle of night have not beens occurring as frequently since she saw Dr . Patel and started taking Pamelor at night.    She has noted an improvement in energy lasting several days when she takes the B12 injection for B12 deficiency diagnosed at last visit.  She s aw Dr MILLER ABOUT THE TRAPEZIUS MUSCLE SPASM ON THE LEFT SIDE   Her husband [name] has parkinson's  And she believes now that most of her symptoms are due to esxcessive worry about his wellbeing.  She has  Given up most of her hobbies and social circles despite his invovlement with his cronies and his encouragement to have her do the same.    Past Medical History  Diagnosis Date  . Hyperlipidemia   . Thyroid disease     Past Surgical History  Procedure Laterality Date  . Melanoma excision      x 2, Dr. Patterson       The following portions of the patient's history were reviewed and updated as appropriate: Allergies, current medications, and problem list.    Review of Systems:   Patient denies headache, fevers, malaise, unintentional weight loss, skin rash, eye pain, sinus congestion and sinus pain, sore throat, dysphagia,  hemoptysis ,  cough, dyspnea, wheezing, chest pain, palpitations, orthopnea, edema, abdominal pain, nausea, melena, diarrhea, constipation, flank pain, dysuria, hematuria, urinary  Frequency, nocturia, numbness, tingling, seizures,  Focal weakness, Loss of consciousness,  Tremor, insomnia, depression, anxiety, and suicidal ideation.     History   Social History  . Marital Status: Married    Spouse Name: N/A  . Number of Children: N/A  . Years of Education: N/A   Occupational History  . Not on file.   Social History Main Topics  . Smoking status: Never Smoker   . Smokeless tobacco: Never Used  . Alcohol Use: 1.5 oz/week    3 drink(s) per week  . Drug Use: No  . Sexual Activity: Yes   Other Topics Concern  . Not on file   Social History Narrative   She lives with husband (optometrist) who has Parkinson's disease.  They have two grown children.   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    Highest level of education:  Bachelor's    Objective:  Filed Vitals:   11/29/14 1610  BP: 140/88  Pulse: 68  Temp: 97.5 F (36.4 C)  Resp: 16     General appearance: alert, cooperative and appears stated age Ears: normal TM's and external ear canals both ears Throat: lips, mucosa, and tongue normal; teeth and gums normal Neck: no   adenopathy, no carotid bruit, supple, symmetrical, trachea midline and thyroid not enlarged, symmetric, no tenderness/mass/nodules Back: symmetric, no curvature. ROM normal. No CVA tenderness. Lungs: clear to auscultation bilaterally Heart: regular rate and rhythm, S1, S2 normal, no murmur, click, rub or gallop Abdomen: soft, non-tender; bowel sounds normal; no masses,  no organomegaly Pulses: 2+ and symmetric Skin: Skin color, texture, turgor normal. No rashes or lesions Lymph nodes: Cervical, supraclavicular, and axillary nodes normal.  Assessment and Plan:  B12 deficiency Managed with home monthly injections. Patient encouraged  to continue   Lab Results  Component Value Date   VITAMINB12 225 02/14/2014        Hypothyroidism Thyroid function is WNL on current dose.  No current changes needed.    Headache improved frequency with initiation of Pamelor by neurology.     Caregiver with fatigue Spent over 25 minutes discussing her current method of managing her husband's diagnosis and prognosis and advised her to resume her normal activities  And interests to avoid burnout    Updated Medication List Outpatient Encounter Prescriptions as of 11/29/2014  Medication Sig  . cholecalciferol (VITAMIN D) 1000 UNITS tablet Take 1,000 Units by mouth daily.    . cyanocobalamin (,VITAMIN B-12,) 1000 MCG/ML injection For daily/weekly/monthly use per MD directions for B12 deficiency  . fish oil-omega-3 fatty acids 1000 MG capsule Take 2 g by mouth daily.    . fluticasone (FLONASE) 50 MCG/ACT nasal spray   . meloxicam (MOBIC) 15 MG tablet   . methocarbamol (ROBAXIN) 500 MG tablet Take 1 tablet (500 mg total) by mouth 3 (three) times daily as needed for muscle spasms.  . nortriptyline (PAMELOR) 10 MG capsule TAKE ONE CAPSULE AT BEDTIME FOR TWO WEEKS THEN INCREASE TO TWO CAPSULES BEDTIME  . Probiotic Product (PROBIOTIC DAILY PO) Take 1 tablet by mouth daily.  . SYNTHROID 75 MCG tablet TAKE ONE TABLET EVERY DAY BEFORE BREAKFAST  . Syringe, Disposable, 1 ML MISC For daily/weekly/monthly B12 injections  . [DISCONTINUED] cyanocobalamin (,VITAMIN B-12,) 1000 MCG/ML injection For daily/weekly/monthly use per MD directions for B12 deficiency  . [DISCONTINUED] SYNTHROID 75 MCG tablet TAKE ONE TABLET EVERY DAY BEFORE BREAKFAST  . [DISCONTINUED] SYNTHROID 75 MCG tablet TAKE ONE TABLET EVERY DAY BEFORE BREAKFAST  . [DISCONTINUED] Syringe, Disposable, 1 ML MISC For daily/weekly/monthly B12 injections  . ALPRAZolam (XANAX) 0.25 MG tablet Take 1 tablet (0.25 mg total) by mouth at bedtime.  . calcium carbonate (OS-CAL) 600 MG TABS Take  600 mg by mouth 2 (two) times daily with a meal.    . co-enzyme Q-10 30 MG capsule Take 30 mg by mouth 3 (three) times daily.    . FOLIC ACID PO Take by mouth.    . traMADol (ULTRAM) 50 MG tablet Take 1 tablet (50 mg total) by mouth every 8 (eight) hours as needed. (Patient not taking: Reported on 11/29/2014)     Orders Placed This Encounter  Procedures  . TSH  . Comp Met (CMET)  . Basic metabolic panel  . Calcium, ionized    Return in about 6 months (around 05/31/2015).       

## 2014-11-29 NOTE — Progress Notes (Signed)
Pre-visit discussion using our clinic review tool. No additional management support is needed unless otherwise documented below in the visit note.  

## 2014-11-30 LAB — COMPREHENSIVE METABOLIC PANEL
ALK PHOS: 59 U/L (ref 39–117)
ALT: 21 U/L (ref 0–35)
AST: 26 U/L (ref 0–37)
Albumin: 4.7 g/dL (ref 3.5–5.2)
BILIRUBIN TOTAL: 0.6 mg/dL (ref 0.2–1.2)
BUN: 16 mg/dL (ref 6–23)
CO2: 23 mEq/L (ref 19–32)
CREATININE: 0.82 mg/dL (ref 0.40–1.20)
Calcium: 10.6 mg/dL — ABNORMAL HIGH (ref 8.4–10.5)
Chloride: 104 mEq/L (ref 96–112)
GFR: 72.96 mL/min (ref >60.00)
Glucose, Bld: 74 mg/dL (ref 70–99)
Potassium: 4.4 mEq/L (ref 3.5–5.1)
Sodium: 140 mEq/L (ref 135–145)
TOTAL PROTEIN: 7.9 g/dL (ref 6.0–8.3)

## 2014-11-30 LAB — TSH: TSH: 0.92 u[IU]/mL (ref 0.35–4.50)

## 2014-12-01 ENCOUNTER — Encounter: Payer: Self-pay | Admitting: Internal Medicine

## 2014-12-01 DIAGNOSIS — R5383 Other fatigue: Secondary | ICD-10-CM | POA: Insufficient documentation

## 2014-12-01 MED ORDER — SYNTHROID 75 MCG PO TABS: ORAL_TABLET | ORAL | Status: DC

## 2014-12-01 NOTE — Assessment & Plan Note (Addendum)
improved frequency with initiation of Pamelor by neurology.

## 2014-12-01 NOTE — Assessment & Plan Note (Signed)
Managed with home monthly injections. Patient encouraged to continue   Lab Results  Component Value Date   VITAMINB12 225 02/14/2014

## 2014-12-01 NOTE — Assessment & Plan Note (Signed)
Spent over 25 minutes discussing her current method of managing her husband's diagnosis and prognosis and advised her to resume her normal activities  And interests to avoid burnout

## 2014-12-01 NOTE — Assessment & Plan Note (Signed)
Thyroid function is WNL on current dose.  No current changes needed.  

## 2014-12-06 DIAGNOSIS — H25013 Cortical age-related cataract, bilateral: Secondary | ICD-10-CM | POA: Diagnosis not present

## 2015-01-25 ENCOUNTER — Other Ambulatory Visit (INDEPENDENT_AMBULATORY_CARE_PROVIDER_SITE_OTHER): Payer: Medicare Other

## 2015-01-25 LAB — BASIC METABOLIC PANEL
BUN: 17 mg/dL (ref 6–23)
CO2: 31 meq/L (ref 19–32)
Calcium: 9.2 mg/dL (ref 8.4–10.5)
Chloride: 105 mEq/L (ref 96–112)
Creatinine, Ser: 0.82 mg/dL (ref 0.40–1.20)
GFR: 72.92 mL/min (ref >60.00)
Glucose, Bld: 92 mg/dL (ref 70–99)
Potassium: 4.1 mEq/L (ref 3.5–5.1)
Sodium: 140 mEq/L (ref 135–145)

## 2015-01-26 LAB — CALCIUM, IONIZED: Calcium, Ion: 1.24 mmol/L (ref 1.12–1.32)

## 2015-01-27 ENCOUNTER — Encounter: Payer: Self-pay | Admitting: Internal Medicine

## 2015-04-07 DIAGNOSIS — M1812 Unilateral primary osteoarthritis of first carpometacarpal joint, left hand: Secondary | ICD-10-CM | POA: Diagnosis not present

## 2015-04-07 DIAGNOSIS — M5412 Radiculopathy, cervical region: Secondary | ICD-10-CM | POA: Diagnosis not present

## 2015-04-07 DIAGNOSIS — M25512 Pain in left shoulder: Secondary | ICD-10-CM | POA: Insufficient documentation

## 2015-04-17 ENCOUNTER — Encounter: Payer: Self-pay | Admitting: Internal Medicine

## 2015-04-17 ENCOUNTER — Ambulatory Visit (INDEPENDENT_AMBULATORY_CARE_PROVIDER_SITE_OTHER): Payer: Medicare Other | Admitting: Internal Medicine

## 2015-04-17 ENCOUNTER — Telehealth: Payer: Self-pay | Admitting: Internal Medicine

## 2015-04-17 VITALS — BP 132/72 | HR 65 | Temp 97.7°F | Resp 12 | Ht 67.0 in | Wt 148.5 lb

## 2015-04-17 DIAGNOSIS — R42 Dizziness and giddiness: Secondary | ICD-10-CM

## 2015-04-17 DIAGNOSIS — R5381 Other malaise: Secondary | ICD-10-CM

## 2015-04-17 DIAGNOSIS — F411 Generalized anxiety disorder: Secondary | ICD-10-CM

## 2015-04-17 DIAGNOSIS — R51 Headache: Secondary | ICD-10-CM | POA: Diagnosis not present

## 2015-04-17 DIAGNOSIS — M549 Dorsalgia, unspecified: Secondary | ICD-10-CM | POA: Diagnosis not present

## 2015-04-17 DIAGNOSIS — M5489 Other dorsalgia: Secondary | ICD-10-CM | POA: Diagnosis not present

## 2015-04-17 DIAGNOSIS — R5383 Other fatigue: Secondary | ICD-10-CM

## 2015-04-17 DIAGNOSIS — R519 Headache, unspecified: Secondary | ICD-10-CM

## 2015-04-17 DIAGNOSIS — Z111 Encounter for screening for respiratory tuberculosis: Secondary | ICD-10-CM

## 2015-04-17 LAB — POCT URINALYSIS DIPSTICK
Bilirubin, UA: NEGATIVE
GLUCOSE UA: NEGATIVE
Ketones, UA: NEGATIVE
NITRITE UA: NEGATIVE
PROTEIN UA: NEGATIVE
Spec Grav, UA: <=1.005
UROBILINOGEN UA: 0.2
pH, UA: 6

## 2015-04-17 MED ORDER — TUBERCULIN PPD 5 UNIT/0.1ML ID SOLN
5.0000 [IU] | Freq: Once | INTRADERMAL | Status: AC
  Administered 2015-04-17: 5 [IU] via INTRADERMAL

## 2015-04-17 MED ORDER — PANTOPRAZOLE SODIUM 40 MG PO TBEC: 40.0000 mg | DELAYED_RELEASE_TABLET | Freq: Every day | ORAL | Status: DC

## 2015-04-17 MED ORDER — ESCITALOPRAM OXALATE 10 MG PO TABS: 10.0000 mg | ORAL_TABLET | Freq: Every day | ORAL | Status: DC

## 2015-04-17 NOTE — Telephone Encounter (Signed)
Mr. Samantha Hanna called  Stated that his wife Samantha Hanna  Is having  Dizzy spells, back pain, stomach ache at times/ Please advise  Mr.  Samantha Hanna [phone].

## 2015-04-17 NOTE — Progress Notes (Signed)
Pre-visit discussion using our clinic review tool. No additional management support is needed unless otherwise documented below in the visit note.  

## 2015-04-17 NOTE — Patient Instructions (Addendum)
Stop the methocarbamol   give neck support while sleeping and riding in car  Continue meloxicam daily with food  Adding protonix for stomach , take daily in the morning before eating    Adding lexapro  10 mg daily for generalized anxiety,   Starting with 5 mg daily for the first week .  Take after dinner

## 2015-04-17 NOTE — Progress Notes (Signed)
Subjective:  Patient ID: Samantha Hanna, female    DOB: 11/13/1940  Age: 74 y.o. MRN: [id number]  CC: The primary encounter diagnosis was Other back pain. Diagnoses of Dizziness and giddiness, Screening for tuberculosis, Occipital headache, Anxiety state, and Malaise and fatigue were also pertinent to this visit.  HPI Samantha Hanna presents for malaise  First symptoms were neck pain and headache  Which started after going for a long swim in her  Private backyard pool after a few weeks of nonactivity.  The neck pain started several hours later,  And was associated with feeling "faint" while sitting in an theater that night that was not air conditioned.  The following day she saw Earnestine Leys, Ortho who treated her  for cervical  neck muscle spasm with mobic and robaxin, which she has been taking daily and twice daily respectively.  Her neck pain has improved  But still she continues to have a dull headache involving the occipital region and feels washed out and and weak .  Shoulders also both "sore" feels achey like the flu.     No fevers, some loose stools due to IBS, some anorexia, no vertigo or visual changes.  Not sleeping well due to back pain and arthritis pain. .  No nocturia or dysuria. Drinks water but not enough.  No nausea.     Thinks SHE MIGHT BE HAVING INCREASED ANXIETY.   Worried about [name] her husband who has declined due to Parkinson's Disease. She has assumed more responsibility for the two of them.  .    Outpatient Prescriptions Prior to Visit  Medication Sig Dispense Refill  . ALPRAZolam (XANAX) 0.25 MG tablet Take 1 tablet (0.25 mg total) by mouth at bedtime. 30 tablet 3  . cholecalciferol (VITAMIN D) 1000 UNITS tablet Take 1,000 Units by mouth daily.      Marland Kitchen co-enzyme Q-10 30 MG capsule Take 30 mg by mouth 3 (three) times daily.      . cyanocobalamin (,VITAMIN B-12,) 1000 MCG/ML injection For daily/weekly/monthly use per MD directions for B12 deficiency 10 mL 3  . fish  oil-omega-3 fatty acids 1000 MG capsule Take 2 g by mouth daily.      . fluticasone (FLONASE) 50 MCG/ACT nasal spray     . FOLIC ACID PO Take by mouth.      . meloxicam (MOBIC) 15 MG tablet     . Probiotic Product (PROBIOTIC DAILY PO) Take 1 tablet by mouth daily.    Marland Kitchen SYNTHROID 75 MCG tablet TAKE ONE TABLET EVERY DAY BEFORE BREAKFAST 90 tablet 1  . Syringe, Disposable, 1 ML MISC For daily/weekly/monthly B12 injections 25 each 2  . traMADol (ULTRAM) 50 MG tablet Take 1 tablet (50 mg total) by mouth every 8 (eight) hours as needed. 30 tablet 0  . methocarbamol (ROBAXIN) 500 MG tablet Take 1 tablet (500 mg total) by mouth 3 (three) times daily as needed for muscle spasms. 30 tablet 0  . calcium carbonate (OS-CAL) 600 MG TABS Take 600 mg by mouth 2 (two) times daily with a meal.      . nortriptyline (PAMELOR) 10 MG capsule TAKE ONE CAPSULE AT BEDTIME FOR TWO WEEKS THEN INCREASE TO TWO CAPSULES BEDTIME (Patient not taking: Reported on 04/17/2015) 60 capsule 3   No facility-administered medications prior to visit.    Review of Systems;  Patient denies headache, fevers, malaise, unintentional weight loss, skin rash, eye pain, sinus congestion and sinus pain, sore throat, dysphagia,  hemoptysis ,  cough, dyspnea, wheezing, chest pain, palpitations, orthopnea, edema, abdominal pain, nausea, melena, diarrhea, constipation, flank pain, dysuria, hematuria, urinary  Frequency, nocturia, numbness, tingling, seizures,  Focal weakness, Loss of consciousness,  Tremor, insomnia, depression, anxiety, and suicidal ideation.      Objective:  BP 132/72 mmHg  Pulse 65  Temp(Src) 97.7 F (36.5 C) (Oral)  Resp 12  Ht 5\' 7"  (1.702 m)  Wt 148 lb 8 oz (67.359 kg)  BMI 23.25 kg/m2  SpO2 98%  BP Readings from Last 3 Encounters:  04/17/15 132/72  11/29/14 140/88  10/25/14 120/78    Wt Readings from Last 3 Encounters:  04/17/15 148 lb 8 oz (67.359 kg)  11/29/14 151 lb 4 oz (68.607 kg)  10/25/14 151 lb 9 oz  (68.748 kg)    General appearance: alert, cooperative and appears stated age.  Appears anxious.  Ears: normal TM's and external ear canals both ears Throat: lips, mucosa, and tongue normal; teeth and gums normal Neck: no adenopathy, no carotid bruit, supple, symmetrical, trachea midline and thyroid not enlarged, symmetric, no tenderness/mass/nodules Back: symmetric, no curvature. ROM normal. No CVA tenderness. Lungs: clear to auscultation bilaterally Heart: regular rate and rhythm, S1, S2 normal, no murmur, click, rub or gallop Abdomen: soft, non-tender; bowel sounds normal; no masses,  no organomegaly Pulses: 2+ and symmetric Skin: Skin color, texture, turgor normal. No rashes or lesions Lymph nodes: Cervical, supraclavicular, and axillary nodes normal.  No results found for: HGBA1C  Lab Results  Component Value Date   CREATININE 0.86 04/17/2015   CREATININE 0.82 01/25/2015   CREATININE 0.82 11/29/2014    Lab Results  Component Value Date   WBC 7.6 04/17/2015   HGB 13.8 04/17/2015   HCT 41.2 04/17/2015   PLT 217.0 04/17/2015   GLUCOSE 114* 04/17/2015   CHOL 259* 09/08/2013   TRIG 70.0 09/08/2013   HDL 66.80 09/08/2013   LDLDIRECT 172.6 09/08/2013   ALT 13 04/17/2015   AST 16 04/17/2015   NA 141 04/17/2015   K 4.2 04/17/2015   CL 103 04/17/2015   CREATININE 0.86 04/17/2015   BUN 14 04/17/2015   CO2 31 04/17/2015   TSH 0.92 11/29/2014    No results found.  Assessment & Plan:   Problem List Items Addressed This Visit      Unprioritized   Occipital headache    She has no signs off meningismus, or altered mentation.  Screening labs for infection/inflammation are normal.  Suggested that her HA may be from her recent cervical muscle strain and advised her to use a cervical support pillow or soft cervical collar when sleeping.       Relevant Medications   escitalopram (LEXAPRO) 10 MG tablet   Anxiety state    Secondary to husband's progressive state of decline due  to Parkinson's disease. Trial of lexapro starting with 5 mg daily .       Relevant Medications   escitalopram (LEXAPRO) 10 MG tablet   Malaise and fatigue    Multifactorial, with anxiety,  Neck muscle strain,  And use of muscel relaxer bid,  Advised to suspend MR .  UA was  Abnormal and urine culture is pending but in the absence of symptoms  will not treat empirically        Other Visit Diagnoses    Other back pain    -  Primary    Relevant Orders    Urinalysis, dipstick only    Urinalysis, Routine w reflex microscopic (not at [facility]) (Completed)  Urine culture    Sedimentation rate (Completed)    C-reactive protein (Completed)    POCT urinalysis dipstick (Completed)    Dizziness and giddiness        Relevant Orders    CBC with Differential/Platelet (Completed)    Comprehensive metabolic panel (Completed)    Iron and TIBC (Completed)    POCT urinalysis dipstick (Completed)    Screening for tuberculosis        Relevant Medications    tuberculin injection 5 Units       I have discontinued Ms. Georgi's methocarbamol and nortriptyline. I am also having her start on pantoprazole and escitalopram. Additionally, I am having her maintain her calcium carbonate, fish oil-omega-3 fatty acids, cholecalciferol, co-enzyme D-97, FOLIC ACID PO, Probiotic Product (PROBIOTIC DAILY PO), traMADol, fluticasone, meloxicam, cyanocobalamin, Syringe (Disposable), ALPRAZolam, and SYNTHROID. We administered tuberculin. We will continue to administer tuberculin.  Meds ordered this encounter  Medications  . tuberculin injection 5 Units    Sig:   . pantoprazole (PROTONIX) 40 MG tablet    Sig: Take 1 tablet (40 mg total) by mouth daily.    Dispense:  30 tablet    Refill:  1  . escitalopram (LEXAPRO) 10 MG tablet    Sig: Take 1 tablet (10 mg total) by mouth daily.    Dispense:  30 tablet    Refill:  2    Medications Discontinued During This Encounter  Medication Reason  . nortriptyline (PAMELOR) 10  MG capsule   . methocarbamol (ROBAXIN) 500 MG tablet     Follow-up: No Follow-up on file.   Crecencio Mc, MD

## 2015-04-17 NOTE — Telephone Encounter (Signed)
Patient scheduled at 5.15

## 2015-04-17 NOTE — Progress Notes (Signed)
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  Will call on Wednesday after a nurse at [facility] reads the test.  Marked test site with a black circle for identification.

## 2015-04-18 ENCOUNTER — Telehealth: Payer: Self-pay | Admitting: Internal Medicine

## 2015-04-18 DIAGNOSIS — R51 Headache: Secondary | ICD-10-CM

## 2015-04-18 DIAGNOSIS — F0150 Vascular dementia without behavioral disturbance: Secondary | ICD-10-CM | POA: Insufficient documentation

## 2015-04-18 DIAGNOSIS — R519 Headache, unspecified: Secondary | ICD-10-CM | POA: Insufficient documentation

## 2015-04-18 DIAGNOSIS — F411 Generalized anxiety disorder: Secondary | ICD-10-CM | POA: Insufficient documentation

## 2015-04-18 DIAGNOSIS — R4181 Age-related cognitive decline: Secondary | ICD-10-CM | POA: Insufficient documentation

## 2015-04-18 DIAGNOSIS — F01B4 Vascular dementia, moderate, with anxiety: Secondary | ICD-10-CM | POA: Insufficient documentation

## 2015-04-18 HISTORY — DX: Generalized anxiety disorder: F41.1

## 2015-04-18 LAB — COMPREHENSIVE METABOLIC PANEL
ALT: 13 U/L (ref 0–35)
AST: 16 U/L (ref 0–37)
Albumin: 4.6 g/dL (ref 3.5–5.2)
Alkaline Phosphatase: 52 U/L (ref 39–117)
BILIRUBIN TOTAL: 0.4 mg/dL (ref 0.2–1.2)
BUN: 14 mg/dL (ref 6–23)
CALCIUM: 10.2 mg/dL (ref 8.4–10.5)
CHLORIDE: 103 meq/L (ref 96–112)
CO2: 31 mEq/L (ref 19–32)
Creatinine, Ser: 0.86 mg/dL (ref 0.40–1.20)
GFR: 68.98 mL/min (ref >60.00)
GLUCOSE: 114 mg/dL — AB (ref 70–99)
Potassium: 4.2 mEq/L (ref 3.5–5.1)
Sodium: 141 mEq/L (ref 135–145)
Total Protein: 7.3 g/dL (ref 6.0–8.3)

## 2015-04-18 LAB — C-REACTIVE PROTEIN: CRP: 0.1 mg/dL — AB (ref 0.5–20.0)

## 2015-04-18 LAB — CBC WITH DIFFERENTIAL/PLATELET
BASOS PCT: 0.5 % (ref 0.0–3.0)
Basophils Absolute: 0 10*3/uL (ref 0.0–0.1)
EOS ABS: 0.1 10*3/uL (ref 0.0–0.7)
EOS PCT: 0.8 % (ref 0.0–5.0)
HEMATOCRIT: 41.2 % (ref 36.0–46.0)
HEMOGLOBIN: 13.8 g/dL (ref 12.0–15.0)
LYMPHS PCT: 19.5 % (ref 12.0–46.0)
Lymphs Abs: 1.5 10*3/uL (ref 0.7–4.0)
MCHC: 33.4 g/dL (ref 30.0–36.0)
MCV: 95.7 fl (ref 78.0–100.0)
MONO ABS: 0.7 10*3/uL (ref 0.1–1.0)
Monocytes Relative: 9.3 % (ref 3.0–12.0)
Neutro Abs: 5.3 10*3/uL (ref 1.4–7.7)
Neutrophils Relative %: 69.9 % (ref 43.0–77.0)
Platelets: 217 10*3/uL (ref 150.0–400.0)
RBC: 4.31 Mil/uL (ref 3.87–5.11)
RDW: 13.6 % (ref 11.5–15.5)
WBC: 7.6 10*3/uL (ref 4.0–10.5)

## 2015-04-18 LAB — URINALYSIS, ROUTINE W REFLEX MICROSCOPIC
Bilirubin Urine: NEGATIVE
HGB URINE DIPSTICK: NEGATIVE
Ketones, ur: NEGATIVE
LEUKOCYTES UA: NEGATIVE
Nitrite: NEGATIVE
PH: 6 (ref 5.0–8.0)
RBC / HPF: NONE SEEN (ref 0–?)
Specific Gravity, Urine: <=1.005 — AB (ref 1.000–1.030)
TOTAL PROTEIN, URINE-UPE24: NEGATIVE
Urine Glucose: NEGATIVE
Urobilinogen, UA: 0.2 (ref 0.0–1.0)
WBC UA: NONE SEEN (ref 0–?)

## 2015-04-18 LAB — IRON AND TIBC
%SAT: 20 % (ref 20–55)
IRON: 60 ug/dL (ref 42–145)
TIBC: 293 ug/dL (ref 250–470)
UIBC: 233 ug/dL (ref 125–400)

## 2015-04-18 LAB — SEDIMENTATION RATE: Sed Rate: 6 mm/hr (ref 0–22)

## 2015-04-18 NOTE — Assessment & Plan Note (Signed)
Multifactorial, with anxiety,  Neck muscle strain,  And use of muscel relaxer bid,  Advised to suspend MR .  UA was  Abnormal and urine culture is pending but in the absence of symptoms  will not treat empirically

## 2015-04-18 NOTE — Assessment & Plan Note (Signed)
Secondary to husband's progressive state of decline due to Parkinson's disease. Trial of lexapro starting with 5 mg daily .

## 2015-04-18 NOTE — Assessment & Plan Note (Signed)
She has no signs off meningismus, or altered mentation.  Screening labs for infection/inflammation are normal.  Suggested that her HA may be from her recent cervical muscle strain and advised her to use a cervical support pillow or soft cervical collar when sleeping.

## 2015-04-18 NOTE — Telephone Encounter (Signed)
patient notified by phone call from Dr  Derrel Nip of normal labs.  Voice mail left on mobile #

## 2015-04-18 NOTE — Assessment & Plan Note (Signed)
" >>  ASSESSMENT AND PLAN FOR VASCULAR DEMENTIA (HCC) WRITTEN ON 04/18/2015  5:37 PM BY Alayiah Fontes L, MD  Multifactorial, with anxiety,  Neck muscle strain,  And use of muscel relaxer bid,  Advised to suspend MR .  UA was  Abnormal and urine culture is pending but in the absence of symptoms  will not treat empirically  "

## 2015-04-19 ENCOUNTER — Encounter: Payer: Self-pay | Admitting: Internal Medicine

## 2015-04-21 ENCOUNTER — Encounter: Payer: Self-pay | Admitting: Internal Medicine

## 2015-04-22 LAB — URINE CULTURE: Colony Count: 50000

## 2015-05-01 NOTE — Telephone Encounter (Signed)
Encounter closed

## 2015-05-02 ENCOUNTER — Other Ambulatory Visit: Payer: Self-pay | Admitting: Specialist

## 2015-05-02 ENCOUNTER — Telehealth: Payer: Self-pay | Admitting: Internal Medicine

## 2015-05-02 ENCOUNTER — Telehealth: Payer: Self-pay | Admitting: Neurology

## 2015-05-02 DIAGNOSIS — M5412 Radiculopathy, cervical region: Secondary | ICD-10-CM | POA: Diagnosis not present

## 2015-05-02 DIAGNOSIS — R51 Headache: Secondary | ICD-10-CM | POA: Diagnosis not present

## 2015-05-02 DIAGNOSIS — R519 Headache, unspecified: Secondary | ICD-10-CM

## 2015-05-02 DIAGNOSIS — M542 Cervicalgia: Secondary | ICD-10-CM | POA: Diagnosis not present

## 2015-05-02 NOTE — Telephone Encounter (Signed)
Called Mr. Gelinas back and left message for him to call me.

## 2015-05-02 NOTE — Telephone Encounter (Signed)
Pt's husband Gershon Mussel called and would like a call back to discuss the headaches she has been having/Dawn CB# [phone]

## 2015-05-02 NOTE — Telephone Encounter (Signed)
Left msg on home and cell phone for pt to call office to schedule appt

## 2015-05-04 ENCOUNTER — Ambulatory Visit
Admission: RE | Admit: 2015-05-04 | Discharge: 2015-05-04 | Disposition: A | Discharge status: Home / Self Care | Payer: Medicare Other | Source: Ambulatory Visit | Attending: Specialist | Admitting: Specialist

## 2015-05-04 ENCOUNTER — Telehealth: Payer: Self-pay | Admitting: *Deleted

## 2015-05-04 ENCOUNTER — Ambulatory Visit: Payer: Medicare Other

## 2015-05-04 DIAGNOSIS — M4802 Spinal stenosis, cervical region: Secondary | ICD-10-CM | POA: Diagnosis not present

## 2015-05-04 DIAGNOSIS — R519 Headache, unspecified: Secondary | ICD-10-CM

## 2015-05-04 DIAGNOSIS — M5412 Radiculopathy, cervical region: Secondary | ICD-10-CM

## 2015-05-04 DIAGNOSIS — R51 Headache: Secondary | ICD-10-CM | POA: Diagnosis not present

## 2015-05-04 DIAGNOSIS — R937 Abnormal findings on diagnostic imaging of other parts of musculoskeletal system: Secondary | ICD-10-CM | POA: Diagnosis not present

## 2015-05-04 DIAGNOSIS — M5032 Other cervical disc degeneration, mid-cervical region: Secondary | ICD-10-CM | POA: Insufficient documentation

## 2015-05-04 NOTE — Telephone Encounter (Signed)
Monday 19 at 3.30?

## 2015-05-04 NOTE — Telephone Encounter (Signed)
Patient requested an earlier appt, than she has scheduled due to her OT Dr. Sabra Heck suggestion.

## 2015-05-04 NOTE — Telephone Encounter (Signed)
Patient has requested an earlier appt.than she has scheduled. Please advise where to put her on the schedule. -Thanks

## 2015-05-05 DIAGNOSIS — M5412 Radiculopathy, cervical region: Secondary | ICD-10-CM | POA: Diagnosis not present

## 2015-05-05 DIAGNOSIS — M542 Cervicalgia: Secondary | ICD-10-CM | POA: Diagnosis not present

## 2015-05-05 NOTE — Telephone Encounter (Signed)
Called Samantha Hanna and left message for him to call back if he still needs to.

## 2015-05-07 NOTE — Telephone Encounter (Signed)
Left message on patient's home line to call office for appointment after husband dropped by my house over Labor day weekend with concern about her new onset hot flashes.

## 2015-05-10 ENCOUNTER — Ambulatory Visit: Payer: Medicare Other

## 2015-05-10 DIAGNOSIS — M5412 Radiculopathy, cervical region: Secondary | ICD-10-CM | POA: Diagnosis not present

## 2015-05-10 DIAGNOSIS — M542 Cervicalgia: Secondary | ICD-10-CM | POA: Diagnosis not present

## 2015-05-12 ENCOUNTER — Ambulatory Visit
Admission: RE | Admit: 2015-05-12 | Discharge: 2015-05-12 | Disposition: A | Discharge status: Home / Self Care | Payer: Medicare Other | Source: Ambulatory Visit | Attending: Internal Medicine | Admitting: Internal Medicine

## 2015-05-12 ENCOUNTER — Encounter: Payer: Self-pay | Admitting: Internal Medicine

## 2015-05-12 ENCOUNTER — Ambulatory Visit (INDEPENDENT_AMBULATORY_CARE_PROVIDER_SITE_OTHER): Payer: Medicare Other | Admitting: Internal Medicine

## 2015-05-12 ENCOUNTER — Telehealth: Payer: Self-pay | Admitting: Internal Medicine

## 2015-05-12 ENCOUNTER — Telehealth: Payer: Self-pay | Admitting: *Deleted

## 2015-05-12 VITALS — BP 140/80 | HR 73 | Temp 98.3°F | Resp 12 | Ht 67.0 in | Wt 143.0 lb

## 2015-05-12 DIAGNOSIS — R232 Flushing: Secondary | ICD-10-CM

## 2015-05-12 DIAGNOSIS — R51 Headache: Secondary | ICD-10-CM

## 2015-05-12 DIAGNOSIS — R519 Headache, unspecified: Secondary | ICD-10-CM

## 2015-05-12 DIAGNOSIS — R918 Other nonspecific abnormal finding of lung field: Secondary | ICD-10-CM | POA: Diagnosis not present

## 2015-05-12 DIAGNOSIS — M5412 Radiculopathy, cervical region: Secondary | ICD-10-CM | POA: Diagnosis not present

## 2015-05-12 DIAGNOSIS — R61 Generalized hyperhidrosis: Secondary | ICD-10-CM | POA: Diagnosis not present

## 2015-05-12 DIAGNOSIS — M4802 Spinal stenosis, cervical region: Secondary | ICD-10-CM

## 2015-05-12 DIAGNOSIS — M542 Cervicalgia: Secondary | ICD-10-CM | POA: Diagnosis not present

## 2015-05-12 DIAGNOSIS — N951 Menopausal and female climacteric states: Secondary | ICD-10-CM | POA: Diagnosis not present

## 2015-05-12 NOTE — Telephone Encounter (Signed)
Tried to reach patient by phone no answer line busy tried cell left message for patient to return call to office. Patient reported having hot flashes scheduled for Monday but patient husband request appointment for today. Will not see another provider.

## 2015-05-12 NOTE — Telephone Encounter (Signed)
Spoke with patient via cell phone, added at 115pm today.

## 2015-05-12 NOTE — Telephone Encounter (Signed)
Pt husband called about wanting her to be seen today for the hot flashes that she's having. Pt has a appt for a follow up on 05/26/2015. Let me know where to sch. Pt only wants to see Dr Derrel Nip. Thank You!

## 2015-05-12 NOTE — Progress Notes (Signed)
Subjective:  Patient ID: Samantha Hanna, female    DOB: 21-Jun-1941  Age: 75 y.o. MRN: [id number]  CC: The primary encounter diagnosis was Vasomotor flushing. Diagnoses of Symptoms, such as flushing, sleeplessness, headache, lack of concentration, associated with the menopause, Spinal stenosis in cervical region, and Occipital headache were also pertinent to this visit.  HPI Samantha Hanna presents for  New onset hot flashes  Started around the time  A few weeks prior to her taking a prednisone taper by Earnestine Leys on Sept 10 for occipital headaches secondary to cervical disk disease.  The sweats involve the upper body and are not associated with hypertension , fasting or being post prandial.  They have been occurring every morning a She Stopped lexapro during the prednisone taper , but the lexapro was on board when the hot flashes started (was prescribed by me on August 22 )  No history of TB exposure  No history of menopausal sympotms at an earlier age  The headaches are improving.   The hot flashes initially started around 1 am ,  Now ocurring around 5 am and occur randomly throughout the day , but have not occurred  during PT . The PT makes her feel better eventually but she had another hot flash afterward during lunch.    Outpatient Prescriptions Prior to Visit  Medication Sig Dispense Refill  . cholecalciferol (VITAMIN D) 1000 UNITS tablet Take 1,000 Units by mouth daily.      . cyanocobalamin (,VITAMIN B-12,) 1000 MCG/ML injection For daily/weekly/monthly use per MD directions for B12 deficiency 10 mL 3  . fish oil-omega-3 fatty acids 1000 MG capsule Take 2 g by mouth daily.      . fluticasone (FLONASE) 50 MCG/ACT nasal spray     . FOLIC ACID PO Take by mouth.      . meloxicam (MOBIC) 15 MG tablet     . pantoprazole (PROTONIX) 40 MG tablet Take 1 tablet (40 mg total) by mouth daily. 30 tablet 1  . SYNTHROID 75 MCG tablet TAKE ONE TABLET EVERY DAY BEFORE BREAKFAST 90 tablet 1  . Syringe,  Disposable, 1 ML MISC For daily/weekly/monthly B12 injections 25 each 2  . ALPRAZolam (XANAX) 0.25 MG tablet Take 1 tablet (0.25 mg total) by mouth at bedtime. (Patient not taking: Reported on 05/12/2015) 30 tablet 3  . co-enzyme Q-10 30 MG capsule Take 30 mg by mouth 3 (three) times daily.      Marland Kitchen escitalopram (LEXAPRO) 10 MG tablet Take 1 tablet (10 mg total) by mouth daily. (Patient not taking: Reported on 05/12/2015) 30 tablet 2  . Probiotic Product (PROBIOTIC DAILY PO) Take 1 tablet by mouth daily.    . traMADol (ULTRAM) 50 MG tablet Take 1 tablet (50 mg total) by mouth every 8 (eight) hours as needed. (Patient not taking: Reported on 05/12/2015) 30 tablet 0  . calcium carbonate (OS-CAL) 600 MG TABS Take 600 mg by mouth 2 (two) times daily with a meal.       No facility-administered medications prior to visit.    Review of Systems;  Patient denies headache, fevers, malaise, unintentional weight loss, skin rash, eye pain, sinus congestion and sinus pain, sore throat, dysphagia,  hemoptysis , cough, dyspnea, wheezing, chest pain, palpitations, orthopnea, edema, abdominal pain, nausea, melena, diarrhea, constipation, flank pain, dysuria, hematuria, urinary  Frequency, nocturia, numbness, tingling, seizures,  Focal weakness, Loss of consciousness,  Tremor, insomnia, depression, anxiety, and suicidal ideation.      Objective:  BP 140/80 mmHg  Pulse 73  Temp(Src) 98.3 F (36.8 C) (Oral)  Resp 12  Ht 5\' 7"  (1.702 m)  Wt 143 lb (64.864 kg)  BMI 22.39 kg/m2  SpO2 98%  BP Readings from Last 3 Encounters:  05/12/15 140/80  04/17/15 132/72  11/29/14 140/88    Wt Readings from Last 3 Encounters:  05/12/15 143 lb (64.864 kg)  04/17/15 148 lb 8 oz (67.359 kg)  11/29/14 151 lb 4 oz (68.607 kg)    General appearance: alert, cooperative and appears stated age Ears: normal TM's and external ear canals both ears Throat: lips, mucosa, and tongue normal; teeth and gums normal Neck: no  adenopathy, no carotid bruit, supple, symmetrical, trachea midline and thyroid not enlarged, symmetric, no tenderness/mass/nodules Back: symmetric, no curvature. ROM normal. No CVA tenderness. Lungs: clear to auscultation bilaterally Heart: regular rate and rhythm, S1, S2 normal, no murmur, click, rub or gallop Abdomen: soft, non-tender; bowel sounds normal; no masses,  no organomegaly Pulses: 2+ and symmetric Skin: Skin color, texture, turgor normal. No rashes or lesions Lymph nodes: Cervical, supraclavicular, inguinal, popliteal, and and axillary nodes normal.  No results found for: HGBA1C  Lab Results  Component Value Date   CREATININE 0.86 04/17/2015   CREATININE 0.82 01/25/2015   CREATININE 0.82 11/29/2014    Lab Results  Component Value Date   WBC 7.6 04/17/2015   HGB 13.8 04/17/2015   HCT 41.2 04/17/2015   PLT 217.0 04/17/2015   GLUCOSE 114* 04/17/2015   CHOL 259* 09/08/2013   TRIG 70.0 09/08/2013   HDL 66.80 09/08/2013   LDLDIRECT 172.6 09/08/2013   ALT 13 04/17/2015   AST 16 04/17/2015   NA 141 04/17/2015   K 4.2 04/17/2015   CL 103 04/17/2015   CREATININE 0.86 04/17/2015   BUN 14 04/17/2015   CO2 31 04/17/2015   TSH 0.92 11/29/2014    Mr Brain Wo Contrast  05/04/2015   CLINICAL DATA:  Headache, unspecified.  EXAM: MRI HEAD WITHOUT CONTRAST  TECHNIQUE: Multiplanar, multiecho pulse sequences of the brain and surrounding structures were obtained without intravenous contrast.  COMPARISON:  03/16/2014  FINDINGS: Calvarium and upper cervical spine: No focal marrow signal abnormality.  Orbits: No significant findings.  Sinuses and Mastoids: Proteinaceous mucous retention cyst in the inferior left maxillary antrum. No acute sinusitis or mastoiditis.  Brain: No explanation for headache.  No acute abnormality such as acute infarct, hemorrhage, hydrocephalus, or mass lesion. No evidence of large vessel occlusion.  Stable appearance of patchy T2 and FLAIR hyperintense foci in  the bilateral cerebral white matter. Although nonspecific, these changes are likely from chronic small vessel disease given stability and patient age. Stable small cortical gliosis also present in the right parietal and mesial right temporal cortex.  IMPRESSION: No acute finding or change from 2015 to explain headache.   Electronically Signed   By: Monte Fantasia M.D.   On: 05/04/2015 11:15   Mr Cervical Spine Wo Contrast  05/04/2015   CLINICAL DATA:  Cervical radiculopathy. Posterior headaches, neck stiffness, tingling and abnormal sensation in the shoulders, arms, and hands.  EXAM: MRI CERVICAL SPINE WITHOUT CONTRAST  TECHNIQUE: Multiplanar, multisequence MR imaging of the cervical spine was performed. No intravenous contrast was administered.  COMPARISON:  None.  FINDINGS: There is straightening of the normal cervical lordosis. There is slight retrolisthesis of C4 on C5. Vertebral body heights are preserved. Disc space narrowing is moderate at C4-5 and mild at C3-4 and C5-6. Mild degenerative endplate changes are  present at these levels without significant marrow edema. A 5 mm T2 hyperintense focus in the T1 vertebral body may represent an atypical hemangioma.  Craniocervical junction is unremarkable. Cervical spinal cord is normal in caliber. Two small foci of T2 signal abnormality are questioned in the left lateral aspect of the cord at C2-3 and in the ventral cord at C4 on axial gradient echo images, however these are not confirmed on axial spin echo images. Paraspinal soft tissues are unremarkable.  C2-3: Minimal right facet arthrosis and minimal disc bulging without stenosis.  C3-4: Mild disc bulging and uncovertebral spurring without significant stenosis.  C4-5: Broad-based posterior disc osteophyte complex results in mild spinal stenosis and mild right and mild-to-moderate left neural foraminal stenosis. No significant mass effect on the spinal cord.  C5-6: Broad-based posterior disc osteophyte complex  asymmetric to the left results in moderate spinal stenosis and mild right and moderate left neural foraminal stenosis. No significant mass effect on the spinal cord.  C6-7: Broad-based posterior disc osteophyte complex results in mild left and minimal right neural foraminal narrowing without spinal stenosis.  C7-T1:  Mild right moderate left facet arthrosis without stenosis.  IMPRESSION: 1. Moderate multilevel cervical disc degeneration, worst at C5-6 where there is moderate spinal stenosis and mild right and moderate left neural foraminal stenosis. 2. Questionable, small foci of T2 signal abnormality in the spinal cord at C2-3 and C4. These may reflect the sequelae of remote insults or artifact, however demyelinating disease or other inflammatory processes are not completely excluded. If these latter possibilities are a clinical concern, consider postcontrast cervical spine MRI.   Electronically Signed   By: Logan Bores M.D.   On: 05/04/2015 11:30    Assessment & Plan:   Problem List Items Addressed This Visit      Unprioritized   Occipital headache    Secondary to cervical DDD.  Improved with prednisone Imaging included MRI cervical spine and brain , ordered by Earnestine Leys.       Symptoms, such as flushing, sleeplessness, headache, lack of concentration, associated with the menopause    Occurring at age 56, ruling out lymphoma,  Chest xray negative.  abd ultrasound ordered,  Nothing in history to  suggest carcinoid.  Thyorid and cbc normal.   Lab Results  Component Value Date   TSH 0.92 11/29/2014   Lab Results  Component Value Date   WBC 7.6 04/17/2015   HGB 13.8 04/17/2015   HCT 41.2 04/17/2015   MCV 95.7 04/17/2015   PLT 217.0 04/17/2015         Spinal stenosis in cervical region    Worst at C5-6.  Headache improved with prednisone taper.  May    Need referral to Pain Clinic.        Other Visit Diagnoses    Vasomotor flushing    -  Primary    Relevant Orders    DG Chest  2 View (Completed)    US Abdomen Complete       I have discontinued Samantha Hanna's calcium carbonate. I am also having her maintain her fish oil-omega-3 fatty acids, cholecalciferol, co-enzyme R-15, FOLIC ACID PO, Probiotic Product (PROBIOTIC DAILY PO), traMADol, fluticasone, meloxicam, cyanocobalamin, Syringe (Disposable), ALPRAZolam, SYNTHROID, pantoprazole, and escitalopram.  No orders of the defined types were placed in this encounter.    A total of 25 minutes of face to face time was spent with patient more than half of which was spent in counselling about the above mentioned conditions  and coordination of care  Medications Discontinued During This Encounter  Medication Reason  . calcium carbonate (OS-CAL) 600 MG TABS Patient Preference    Follow-up: Return in about 4 weeks (around 06/09/2015).   Crecencio Mc, MD

## 2015-05-12 NOTE — Telephone Encounter (Signed)
Patient has been scheduled on 9/19/ at 10:45 to be seen, however, patients husband has requested an appt today if Dr. Derrel Nip can fit her in on the schedule. -thanks

## 2015-05-12 NOTE — Patient Instructions (Signed)
Your exam is normal today   Youu can get your chest  X ray today  The abdominal ultrasound will be set up for you by our office  You can resume the lexapro staring with 1/2 tablet for the first week,  After dinner.  Increase to full tablet after one week.  I would get the flu vaccine next week

## 2015-05-12 NOTE — Telephone Encounter (Signed)
4:30,  Or 1:15 if she can be reached

## 2015-05-12 NOTE — Progress Notes (Signed)
Pre-visit discussion using our clinic review tool. No additional management support is needed unless otherwise documented below in the visit note.  

## 2015-05-14 DIAGNOSIS — N951 Menopausal and female climacteric states: Secondary | ICD-10-CM | POA: Insufficient documentation

## 2015-05-14 DIAGNOSIS — F4320 Adjustment disorder, unspecified: Secondary | ICD-10-CM | POA: Insufficient documentation

## 2015-05-14 DIAGNOSIS — M4802 Spinal stenosis, cervical region: Secondary | ICD-10-CM | POA: Insufficient documentation

## 2015-05-14 DIAGNOSIS — F4321 Adjustment disorder with depressed mood: Secondary | ICD-10-CM | POA: Insufficient documentation

## 2015-05-14 HISTORY — DX: Spinal stenosis, cervical region: M48.02

## 2015-05-14 NOTE — Assessment & Plan Note (Signed)
Occurring at age 74, ruling out lymphoma,  Chest xray negative.  abd ultrasound ordered,  Nothing in history to  suggest carcinoid.  Thyorid and cbc normal.   Lab Results  Component Value Date   TSH 0.92 11/29/2014   Lab Results  Component Value Date   WBC 7.6 04/17/2015   HGB 13.8 04/17/2015   HCT 41.2 04/17/2015   MCV 95.7 04/17/2015   PLT 217.0 04/17/2015

## 2015-05-14 NOTE — Assessment & Plan Note (Signed)
Secondary to cervical DDD.  Improved with prednisone Imaging included MRI cervical spine and brain , ordered by Earnestine Leys.

## 2015-05-14 NOTE — Assessment & Plan Note (Addendum)
Worst at C5-6.  Headache improved with prednisone taper.  May    Need referral to Pain Clinic.

## 2015-05-15 ENCOUNTER — Ambulatory Visit: Payer: Medicare Other | Admitting: Internal Medicine

## 2015-05-15 ENCOUNTER — Ambulatory Visit
Admission: RE | Admit: 2015-05-15 | Discharge: 2015-05-15 | Disposition: A | Discharge status: Home / Self Care | Payer: Medicare Other | Source: Ambulatory Visit | Attending: Internal Medicine | Admitting: Internal Medicine

## 2015-05-15 DIAGNOSIS — N281 Cyst of kidney, acquired: Secondary | ICD-10-CM | POA: Insufficient documentation

## 2015-05-15 DIAGNOSIS — R232 Flushing: Secondary | ICD-10-CM | POA: Insufficient documentation

## 2015-05-15 DIAGNOSIS — R61 Generalized hyperhidrosis: Secondary | ICD-10-CM | POA: Diagnosis not present

## 2015-05-16 ENCOUNTER — Encounter: Payer: Self-pay | Admitting: Internal Medicine

## 2015-05-17 DIAGNOSIS — M542 Cervicalgia: Secondary | ICD-10-CM | POA: Diagnosis not present

## 2015-05-17 DIAGNOSIS — M5412 Radiculopathy, cervical region: Secondary | ICD-10-CM | POA: Diagnosis not present

## 2015-05-22 DIAGNOSIS — M5412 Radiculopathy, cervical region: Secondary | ICD-10-CM | POA: Diagnosis not present

## 2015-05-22 DIAGNOSIS — M542 Cervicalgia: Secondary | ICD-10-CM | POA: Diagnosis not present

## 2015-05-22 DIAGNOSIS — Z23 Encounter for immunization: Secondary | ICD-10-CM | POA: Diagnosis not present

## 2015-05-26 ENCOUNTER — Ambulatory Visit: Payer: Medicare Other | Admitting: Internal Medicine

## 2015-05-26 DIAGNOSIS — M5412 Radiculopathy, cervical region: Secondary | ICD-10-CM | POA: Diagnosis not present

## 2015-05-26 DIAGNOSIS — M542 Cervicalgia: Secondary | ICD-10-CM | POA: Diagnosis not present

## 2015-05-31 DIAGNOSIS — M542 Cervicalgia: Secondary | ICD-10-CM | POA: Diagnosis not present

## 2015-05-31 DIAGNOSIS — M5412 Radiculopathy, cervical region: Secondary | ICD-10-CM | POA: Diagnosis not present

## 2015-06-06 DIAGNOSIS — H903 Sensorineural hearing loss, bilateral: Secondary | ICD-10-CM | POA: Diagnosis not present

## 2015-06-06 DIAGNOSIS — J309 Allergic rhinitis, unspecified: Secondary | ICD-10-CM | POA: Diagnosis not present

## 2015-06-07 DIAGNOSIS — M5412 Radiculopathy, cervical region: Secondary | ICD-10-CM | POA: Diagnosis not present

## 2015-06-07 DIAGNOSIS — M542 Cervicalgia: Secondary | ICD-10-CM | POA: Diagnosis not present

## 2015-06-09 DIAGNOSIS — M5412 Radiculopathy, cervical region: Secondary | ICD-10-CM | POA: Diagnosis not present

## 2015-06-09 DIAGNOSIS — M542 Cervicalgia: Secondary | ICD-10-CM | POA: Diagnosis not present

## 2015-06-12 DIAGNOSIS — M542 Cervicalgia: Secondary | ICD-10-CM | POA: Diagnosis not present

## 2015-06-12 DIAGNOSIS — M5412 Radiculopathy, cervical region: Secondary | ICD-10-CM | POA: Diagnosis not present

## 2015-06-14 DIAGNOSIS — M5412 Radiculopathy, cervical region: Secondary | ICD-10-CM | POA: Diagnosis not present

## 2015-06-14 DIAGNOSIS — M542 Cervicalgia: Secondary | ICD-10-CM | POA: Diagnosis not present

## 2015-06-16 ENCOUNTER — Encounter: Payer: Self-pay | Admitting: Internal Medicine

## 2015-06-16 ENCOUNTER — Ambulatory Visit (INDEPENDENT_AMBULATORY_CARE_PROVIDER_SITE_OTHER): Payer: Medicare Other | Admitting: Internal Medicine

## 2015-06-16 VITALS — BP 122/74 | HR 60 | Temp 97.8°F | Resp 12 | Ht 67.0 in | Wt 144.2 lb

## 2015-06-16 DIAGNOSIS — E034 Atrophy of thyroid (acquired): Secondary | ICD-10-CM

## 2015-06-16 DIAGNOSIS — F411 Generalized anxiety disorder: Secondary | ICD-10-CM

## 2015-06-16 DIAGNOSIS — E039 Hypothyroidism, unspecified: Secondary | ICD-10-CM

## 2015-06-16 DIAGNOSIS — E038 Other specified hypothyroidism: Secondary | ICD-10-CM | POA: Diagnosis not present

## 2015-06-16 DIAGNOSIS — R5383 Other fatigue: Secondary | ICD-10-CM

## 2015-06-16 DIAGNOSIS — Z1239 Encounter for other screening for malignant neoplasm of breast: Secondary | ICD-10-CM

## 2015-06-16 DIAGNOSIS — R5381 Other malaise: Secondary | ICD-10-CM

## 2015-06-16 DIAGNOSIS — M4802 Spinal stenosis, cervical region: Secondary | ICD-10-CM

## 2015-06-16 DIAGNOSIS — E538 Deficiency of other specified B group vitamins: Secondary | ICD-10-CM

## 2015-06-16 LAB — TSH: TSH: 0.26 u[IU]/mL — AB (ref 0.35–4.50)

## 2015-06-16 NOTE — Patient Instructions (Addendum)
I'm glad you are feeling better!  We made no changes today.  We are checking your thyroid level today  We discussed several things today:   Keffir milk has natural probiotics and is a great additive to smoothies and can be made from almond milk,  Cows milk or goat's milk .  Regular use  may help regulate your bowel movements.   "aqualogix" is the water resistance tool I described for water workouts.

## 2015-06-16 NOTE — Progress Notes (Signed)
Pre-visit discussion using our clinic review tool. No additional management support is needed unless otherwise documented below in the visit note.  

## 2015-06-16 NOTE — Progress Notes (Signed)
Subjective:  Patient ID: Samantha Hanna, female    DOB: [birth year]  Age: 74 y.o. MRN: [id number]  CC: The primary encounter diagnosis was Breast cancer screening. Diagnoses of Hypothyroidism (acquired), Hypothyroidism due to acquired atrophy of thyroid, Anxiety state, B12 deficiency, Spinal stenosis in cervical region, and Malaise and fatigue were also pertinent to this visit.  HPI Samantha Hanna presents for follow up on hot flashes, , recurrent headaches and neck pain.  Since her last visitd MRIs of the brain andof  the cervical spine were done, which confirmed DDD of the cervical spine as the cause for her headaches and shoulder pain.  Workup for hot flashes per husband's request included chest x ray,  Abdominal ultrasound and CBC, all of which were normal.  Her neck pain has been Improving with PT   Trial of lexapro started one month ago has also improved her anxiety and frequency of hot flashes.  She has also started receiving IM B12 injections, which she notes gives her energy.  She notes a drop in energy after about 3 weeks.     Outpatient Prescriptions Prior to Visit  Medication Sig Dispense Refill  . cholecalciferol (VITAMIN D) 1000 UNITS tablet Take 1,000 Units by mouth daily.      Marland Kitchen co-enzyme Q-10 30 MG capsule Take 30 mg by mouth 3 (three) times daily.      . cyanocobalamin (,VITAMIN B-12,) 1000 MCG/ML injection For daily/weekly/monthly use per MD directions for B12 deficiency 10 mL 3  . escitalopram (LEXAPRO) 10 MG tablet Take 1 tablet (10 mg total) by mouth daily. 30 tablet 2  . fish oil-omega-3 fatty acids 1000 MG capsule Take 2 g by mouth daily.      . fluticasone (FLONASE) 50 MCG/ACT nasal spray     . FOLIC ACID PO Take by mouth.      . meloxicam (MOBIC) 15 MG tablet     . pantoprazole (PROTONIX) 40 MG tablet Take 1 tablet (40 mg total) by mouth daily. 30 tablet 1  . Probiotic Product (PROBIOTIC DAILY PO) Take 1 tablet by mouth daily.    . Syringe, Disposable, 1 ML MISC For  daily/weekly/monthly B12 injections 25 each 2  . SYNTHROID 75 MCG tablet TAKE ONE TABLET EVERY DAY BEFORE BREAKFAST 90 tablet 1  . ALPRAZolam (XANAX) 0.25 MG tablet Take 1 tablet (0.25 mg total) by mouth at bedtime. (Patient not taking: Reported on 05/12/2015) 30 tablet 3  . traMADol (ULTRAM) 50 MG tablet Take 1 tablet (50 mg total) by mouth every 8 (eight) hours as needed. (Patient not taking: Reported on 05/12/2015) 30 tablet 0   No facility-administered medications prior to visit.    Review of Systems;  Patient denies headache, fevers, malaise, unintentional weight loss, skin rash, eye pain, sinus congestion and sinus pain, sore throat, dysphagia,  hemoptysis , cough, dyspnea, wheezing, chest pain, palpitations, orthopnea, edema, abdominal pain, nausea, melena, diarrhea, constipation, flank pain, dysuria, hematuria, urinary  Frequency, nocturia, numbness, tingling, seizures,  Focal weakness, Loss of consciousness,  Tremor, insomnia, depression, anxiety, and suicidal ideation.      Objective:  BP 122/74 mmHg  Pulse 60  Temp(Src) 97.8 F (36.6 C) (Oral)  Resp 12  Ht 5\' 7"  (1.702 m)  Wt 144 lb 4 oz (65.431 kg)  BMI 22.59 kg/m2  SpO2 98%  BP Readings from Last 3 Encounters:  06/16/15 122/74  05/12/15 140/80  04/17/15 132/72    Wt Readings from Last 3 Encounters:  06/16/15 144 lb  4 oz (65.431 kg)  05/12/15 143 lb (64.864 kg)  04/17/15 148 lb 8 oz (67.359 kg)    General appearance: alert, cooperative and appears stated age Ears: normal TM's and external ear canals both ears Throat: lips, mucosa, and tongue normal; teeth and gums normal Neck: no adenopathy, no carotid bruit, supple, symmetrical, trachea midline and thyroid not enlarged, symmetric, no tenderness/mass/nodules Back: symmetric, no curvature. ROM normal. No CVA tenderness. Lungs: clear to auscultation bilaterally Heart: regular rate and rhythm, S1, S2 normal, no murmur, click, rub or gallop Abdomen: soft, non-tender;  bowel sounds normal; no masses,  no organomegaly Pulses: 2+ and symmetric Skin: Skin color, texture, turgor normal. No rashes or lesions Lymph nodes: Cervical, supraclavicular, and axillary nodes normal.  No results found for: HGBA1C  Lab Results  Component Value Date   CREATININE 0.86 04/17/2015   CREATININE 0.82 01/25/2015   CREATININE 0.82 11/29/2014    Lab Results  Component Value Date   WBC 7.6 04/17/2015   HGB 13.8 04/17/2015   HCT 41.2 04/17/2015   PLT 217.0 04/17/2015   GLUCOSE 114* 04/17/2015   CHOL 259* 09/08/2013   TRIG 70.0 09/08/2013   HDL 66.80 09/08/2013   LDLDIRECT 172.6 09/08/2013   ALT 13 04/17/2015   AST 16 04/17/2015   NA 141 04/17/2015   K 4.2 04/17/2015   CL 103 04/17/2015   CREATININE 0.86 04/17/2015   BUN 14 04/17/2015   CO2 31 04/17/2015   TSH 0.26* 06/16/2015    US Abdomen Complete  05/15/2015  CLINICAL DATA:  Night sweats.  Vasomotor flushing. EXAM: ULTRASOUND ABDOMEN COMPLETE COMPARISON:  None. FINDINGS: Gallbladder: No gallstones or wall thickening visualized. No sonographic Murphy sign noted. Common bile duct: Diameter: 4 mm, within normal limits. Liver: No focal lesion identified. Within normal limits in parenchymal echogenicity. IVC: No abnormality visualized. Pancreas: Visualized portion unremarkable. Spleen: Size and appearance within normal limits. Right Kidney: Length: 10.3 cm. Echogenicity within normal limits. No mass or hydronephrosis visualized. Left Kidney: Length: 11.4 cm. Echogenicity within normal limits. No mass or hydronephrosis visualized. Several small parapelvic renal cysts are seen. Abdominal aorta: No aneurysm visualized. Other findings: None. IMPRESSION: No evidence of gallstones, biliary dilatation, or other acute findings. Small left renal parapelvic cysts incidentally noted. No evidence of hydronephrosis. Electronically Signed   By: Earle Gell M.D.   On: 05/15/2015 11:04    Assessment & Plan:   Problem List Items  Addressed This Visit    B12 deficiency    manageed iwht IM injections.  Discussed increasing frequency to every 3 weeks for energy lag.       Anxiety state    Multifactorial, aggravated by neck pain, hot flashes and husband's diagnosis of Parkinson's Disease.  Improved with lexapro.        Malaise and fatigue    Secondary to B12 deficiency, pain, and anxiety,  Now improved.       Spinal stenosis in cervical region    Worst at C5-6.  Headache improved with prednisone taper and symptoms now managed with PT and meloxicam.  Discussed ways to exercise that will not aggravated condition..  No radiculopathy at present.       Hypothyroidism    Thyroid function is overactive on current dose of Synnthroid 75 mcg, .  I have sent a lower dose  Of 50 mcg Synthroid to her pharmacy and would like patient to change . Repeat TSH should be done no earlier than 6 weeks after dose change.  Relevant Medications   levothyroxine (SYNTHROID) 50 MCG tablet    Other Visit Diagnoses    Breast cancer screening    -  Primary    Relevant Orders    MM DIGITAL SCREENING BILATERAL    TSH (Completed)    Hypothyroidism (acquired)        Relevant Medications    levothyroxine (SYNTHROID) 50 MCG tablet    Other Relevant Orders    TSH (Completed)      A total of 40 minutes was spent with patient more than half of which was spent in counseling patient on the above mentioned issues , reviewing and explaining recent labs and imaging studies done, and coordination of care.  I have discontinued Ms. Reeder's traMADol, ALPRAZolam, and SYNTHROID. I am also having her start on levothyroxine. Additionally, I am having her maintain her fish oil-omega-3 fatty acids, cholecalciferol, co-enzyme Q-59, FOLIC ACID PO, Probiotic Product (PROBIOTIC DAILY PO), fluticasone, meloxicam, cyanocobalamin, Syringe (Disposable), pantoprazole, and escitalopram.  Meds ordered this encounter  Medications  . levothyroxine (SYNTHROID) 50  MCG tablet    Sig: Take 1 tablet (50 mcg total) by mouth daily before breakfast.    Dispense:  90 tablet    Refill:  0    Medications Discontinued During This Encounter  Medication Reason  . ALPRAZolam (XANAX) 0.25 MG tablet   . traMADol (ULTRAM) 50 MG tablet   . SYNTHROID 75 MCG tablet     Follow-up: No Follow-up on file.   Crecencio Mc, MD

## 2015-06-17 ENCOUNTER — Encounter: Payer: Self-pay | Admitting: Internal Medicine

## 2015-06-17 MED ORDER — LEVOTHYROXINE SODIUM 50 MCG PO TABS: 50.0000 ug | ORAL_TABLET | Freq: Every day | ORAL | Status: DC

## 2015-06-17 NOTE — Assessment & Plan Note (Signed)
Thyroid function is overactive on current dose of Synnthroid 75 mcg, .  I have sent a lower dose  Of 50 mcg Synthroid to her pharmacy and would like patient to change . Repeat TSH should be done no earlier than 6 weeks after dose change.

## 2015-06-18 ENCOUNTER — Encounter: Payer: Self-pay | Admitting: Internal Medicine

## 2015-06-18 NOTE — Assessment & Plan Note (Signed)
manageed iwht IM injections.  Discussed increasing frequency to every 3 weeks for energy lag.

## 2015-06-18 NOTE — Assessment & Plan Note (Signed)
Secondary to B12 deficiency, pain, and anxiety,  Now improved.

## 2015-06-18 NOTE — Assessment & Plan Note (Signed)
Worst at C5-6.  Headache improved with prednisone taper and symptoms now managed with PT and meloxicam.  Discussed ways to exercise that will not aggravated condition..  No radiculopathy at present.

## 2015-06-18 NOTE — Assessment & Plan Note (Signed)
Multifactorial, aggravated by neck pain, hot flashes and husband's diagnosis of Parkinson's Disease.  Improved with lexapro.

## 2015-06-18 NOTE — Assessment & Plan Note (Signed)
" >>  ASSESSMENT AND PLAN FOR VASCULAR DEMENTIA (HCC) WRITTEN ON 06/18/2015 12:02 PM BY Torrence Hammack L, MD  Secondary to B12 deficiency, pain, and anxiety,  Now improved.  "

## 2015-06-19 ENCOUNTER — Other Ambulatory Visit: Payer: Self-pay | Admitting: Internal Medicine

## 2015-06-21 DIAGNOSIS — M6281 Muscle weakness (generalized): Secondary | ICD-10-CM | POA: Diagnosis not present

## 2015-06-27 DIAGNOSIS — M542 Cervicalgia: Secondary | ICD-10-CM | POA: Diagnosis not present

## 2015-06-27 DIAGNOSIS — M5412 Radiculopathy, cervical region: Secondary | ICD-10-CM | POA: Diagnosis not present

## 2015-07-05 DIAGNOSIS — M5412 Radiculopathy, cervical region: Secondary | ICD-10-CM | POA: Diagnosis not present

## 2015-07-05 DIAGNOSIS — M542 Cervicalgia: Secondary | ICD-10-CM | POA: Diagnosis not present

## 2015-07-12 DIAGNOSIS — M5412 Radiculopathy, cervical region: Secondary | ICD-10-CM | POA: Diagnosis not present

## 2015-07-12 DIAGNOSIS — M542 Cervicalgia: Secondary | ICD-10-CM | POA: Diagnosis not present

## 2015-07-27 ENCOUNTER — Ambulatory Visit: Payer: Medicare Other | Admitting: Neurology

## 2015-07-31 ENCOUNTER — Other Ambulatory Visit (INDEPENDENT_AMBULATORY_CARE_PROVIDER_SITE_OTHER): Payer: Medicare Other

## 2015-07-31 DIAGNOSIS — E039 Hypothyroidism, unspecified: Secondary | ICD-10-CM

## 2015-07-31 LAB — TSH: TSH: 3.93 u[IU]/mL (ref 0.35–4.50)

## 2015-08-01 ENCOUNTER — Encounter: Payer: Self-pay | Admitting: Internal Medicine

## 2015-09-16 ENCOUNTER — Other Ambulatory Visit: Payer: Self-pay | Admitting: Internal Medicine

## 2015-10-05 DIAGNOSIS — C44519 Basal cell carcinoma of skin of other part of trunk: Secondary | ICD-10-CM | POA: Diagnosis not present

## 2015-10-05 DIAGNOSIS — Z85820 Personal history of malignant melanoma of skin: Secondary | ICD-10-CM | POA: Diagnosis not present

## 2015-10-05 DIAGNOSIS — D485 Neoplasm of uncertain behavior of skin: Secondary | ICD-10-CM | POA: Diagnosis not present

## 2015-10-26 ENCOUNTER — Other Ambulatory Visit: Payer: Self-pay

## 2015-10-26 DIAGNOSIS — E034 Atrophy of thyroid (acquired): Secondary | ICD-10-CM

## 2015-10-26 MED ORDER — LEVOTHYROXINE SODIUM 50 MCG PO TABS: 50.0000 ug | ORAL_TABLET | Freq: Every day | ORAL | Status: DC

## 2015-10-26 NOTE — Telephone Encounter (Signed)
Pt states that she is having a reaction to the levythroxine. Pt states that she has been sweating spells, feeling hot and cold at the same time. She wants to switch to the Brand name Synthroid instead to see if her symptoms stop. Please advise, thanks

## 2015-10-26 NOTE — Telephone Encounter (Signed)
Brand name rx sent. Please ask her to have tsh checked. labs ordered

## 2015-10-26 NOTE — Telephone Encounter (Signed)
Pt notified of Rx and scheduled for labs 10/31/15

## 2015-10-27 DIAGNOSIS — C44519 Basal cell carcinoma of skin of other part of trunk: Secondary | ICD-10-CM | POA: Diagnosis not present

## 2015-10-31 ENCOUNTER — Other Ambulatory Visit (INDEPENDENT_AMBULATORY_CARE_PROVIDER_SITE_OTHER): Payer: Medicare Other

## 2015-10-31 DIAGNOSIS — E034 Atrophy of thyroid (acquired): Secondary | ICD-10-CM

## 2015-10-31 DIAGNOSIS — E038 Other specified hypothyroidism: Secondary | ICD-10-CM | POA: Diagnosis not present

## 2015-11-01 ENCOUNTER — Encounter: Payer: Self-pay | Admitting: Internal Medicine

## 2015-11-01 DIAGNOSIS — E034 Atrophy of thyroid (acquired): Secondary | ICD-10-CM

## 2015-11-01 LAB — T4 AND TSH
T4, Total: 7.8 ug/dL (ref 4.5–12.0)
TSH: 4.89 u[IU]/mL — ABNORMAL HIGH (ref 0.450–4.500)

## 2015-11-01 MED ORDER — LEVOTHYROXINE SODIUM 75 MCG PO TABS: 75.0000 ug | ORAL_TABLET | Freq: Every day | ORAL | Status: DC

## 2015-12-04 ENCOUNTER — Other Ambulatory Visit (INDEPENDENT_AMBULATORY_CARE_PROVIDER_SITE_OTHER): Payer: Medicare Other

## 2015-12-04 DIAGNOSIS — E034 Atrophy of thyroid (acquired): Secondary | ICD-10-CM | POA: Diagnosis not present

## 2015-12-04 DIAGNOSIS — E038 Other specified hypothyroidism: Secondary | ICD-10-CM

## 2015-12-05 LAB — T4 AND TSH
T4, Total: 8.7 ug/dL (ref 4.5–12.0)
TSH: 1.35 u[IU]/mL (ref 0.450–4.500)

## 2015-12-06 ENCOUNTER — Encounter: Payer: Self-pay | Admitting: Internal Medicine

## 2015-12-10 IMAGING — CR DG CHEST 2V
1 series · 2 of 2 positions shown · non-contrast
Comparison: None.

CLINICAL DATA: 73-year-old female with a history of new onset
flushing and night sweats.

EXAM:
CHEST - 2 VIEW

[Series 1: dg chest 2 view · 0.14mm/px · 2 of 2 slices shown]
[im 1/2]
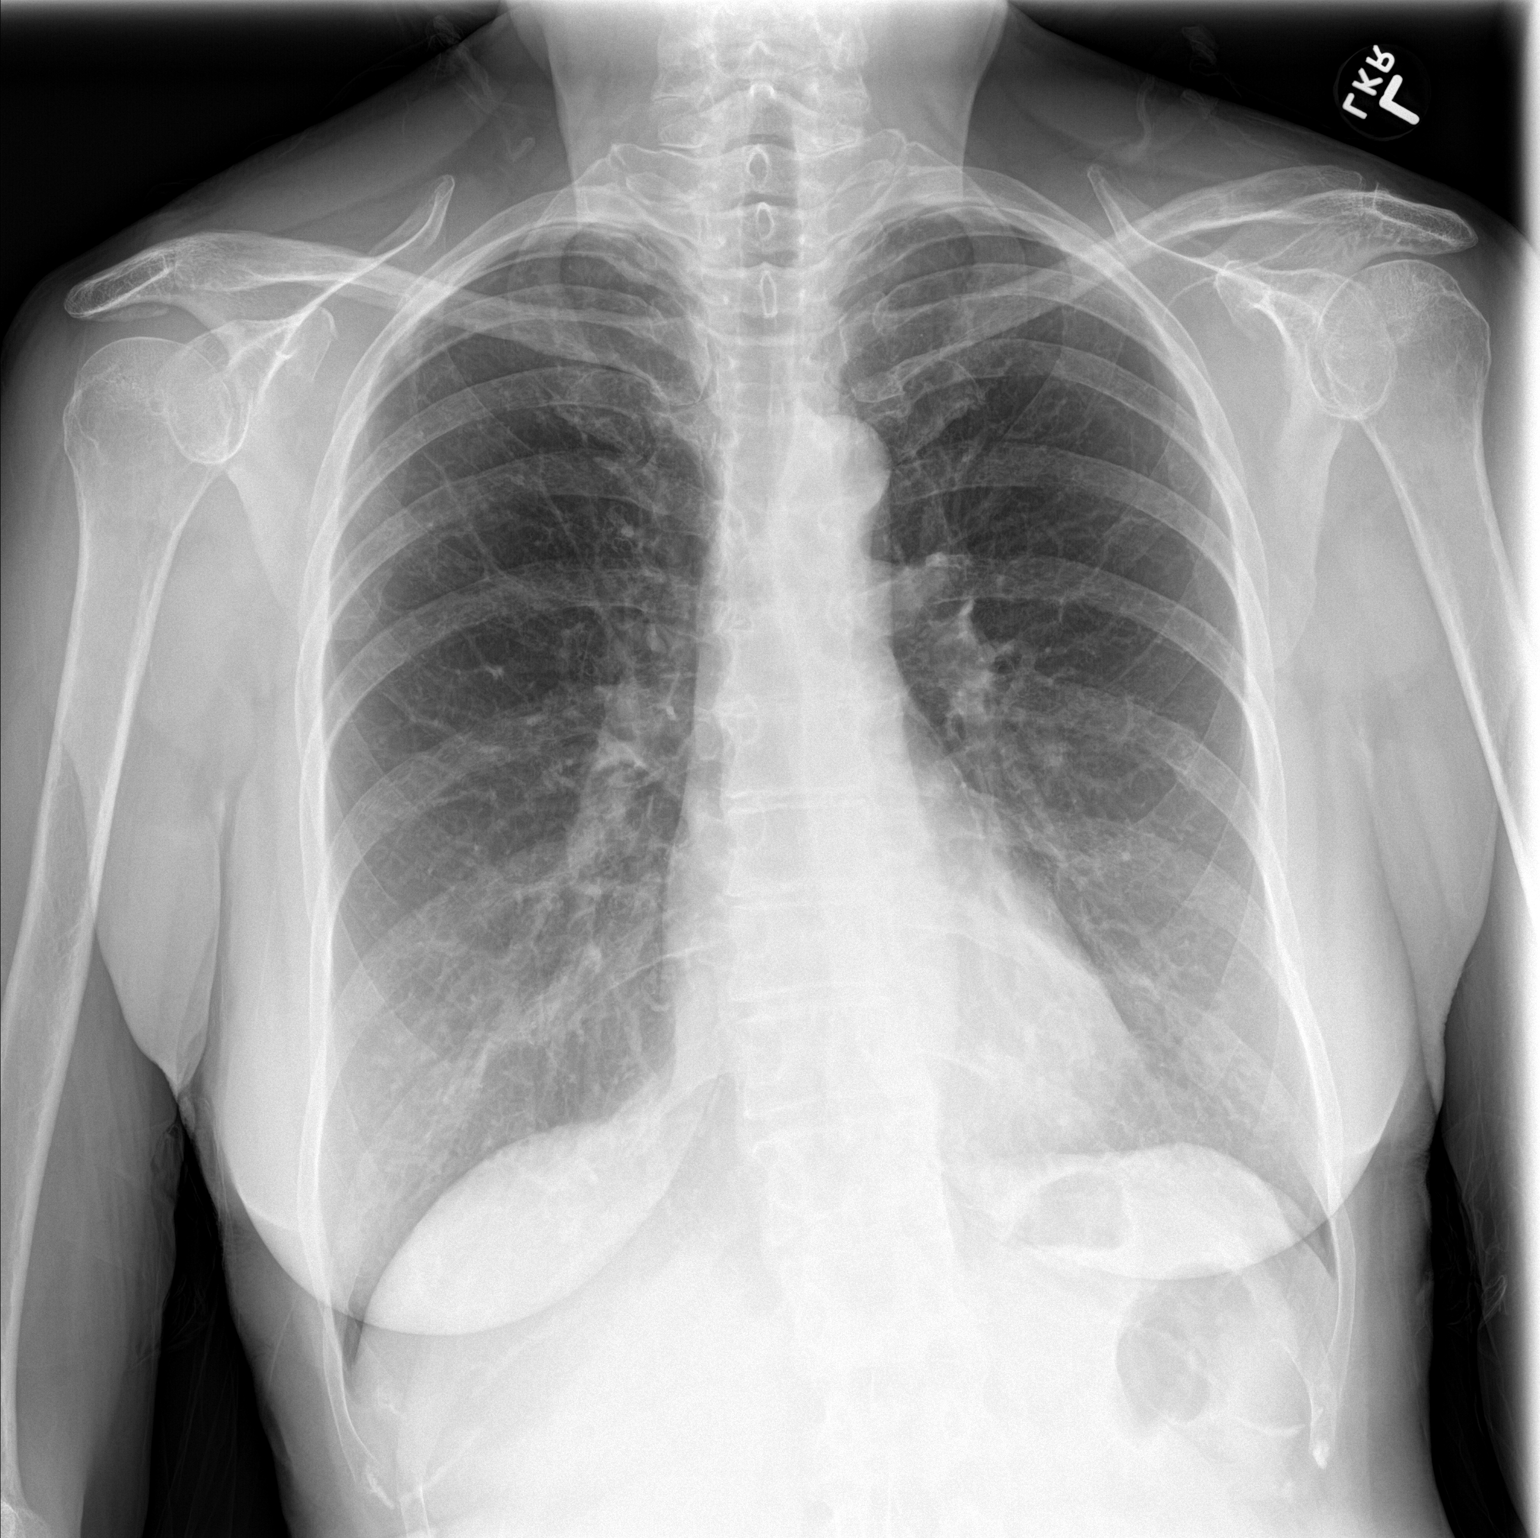
[im 2/2]
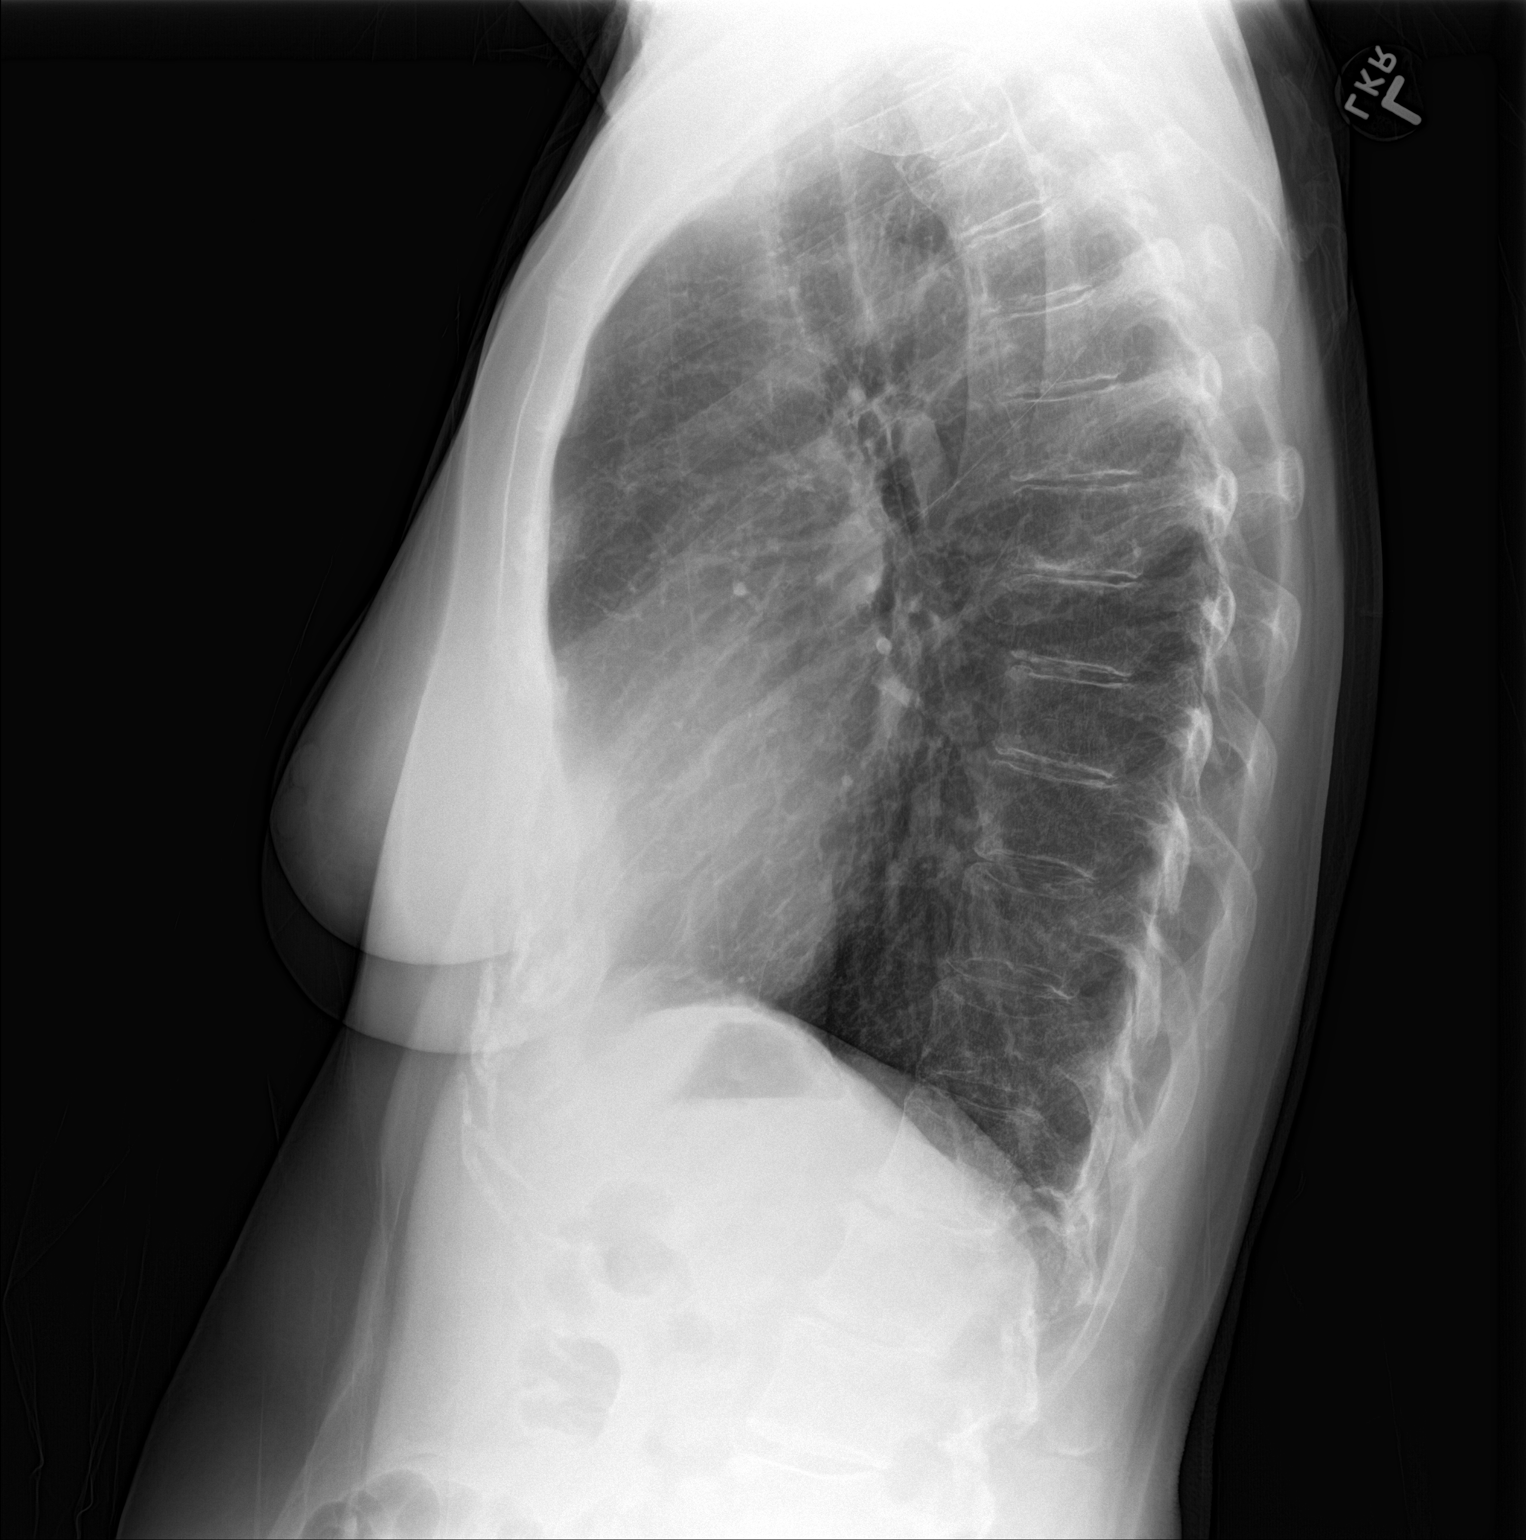

[2 of 2 positions shown; findings below may reference images not displayed]

FINDINGS: Cardiomediastinal silhouette projects within normal limits in size
and contour. No confluent airspace disease, pneumothorax, or pleural
effusion.

No displaced fracture.

Unremarkable appearance of the upper abdomen.
IMPRESSION: No radiographic evidence of acute cardiopulmonary disease.

## 2015-12-11 ENCOUNTER — Telehealth: Payer: Self-pay | Admitting: Internal Medicine

## 2015-12-11 NOTE — Telephone Encounter (Signed)
Pt husband came in stating that his wife is having sever hot flash.. Pt is unable to do anything when she is having these.. Husband is very concerned about this. Please advise pt at [phone]

## 2015-12-11 NOTE — Telephone Encounter (Signed)
Spoke with patient.  The hot flashes seem to be intense then before.  Upper body heat, tinging in both extremities is intermittent.  Patient was out of town over the weekend and it seem much worse.  If has gotten better today. Scheduled for an appt on Friday with you.

## 2015-12-15 ENCOUNTER — Encounter: Payer: Self-pay | Admitting: Internal Medicine

## 2015-12-15 ENCOUNTER — Ambulatory Visit (INDEPENDENT_AMBULATORY_CARE_PROVIDER_SITE_OTHER): Payer: Medicare Other | Admitting: Internal Medicine

## 2015-12-15 VITALS — BP 120/76 | HR 65 | Temp 97.8°F | Resp 12 | Ht 67.0 in | Wt 147.5 lb

## 2015-12-15 DIAGNOSIS — E538 Deficiency of other specified B group vitamins: Secondary | ICD-10-CM | POA: Diagnosis not present

## 2015-12-15 DIAGNOSIS — R5383 Other fatigue: Secondary | ICD-10-CM | POA: Diagnosis not present

## 2015-12-15 DIAGNOSIS — Z79899 Other long term (current) drug therapy: Secondary | ICD-10-CM | POA: Diagnosis not present

## 2015-12-15 DIAGNOSIS — F411 Generalized anxiety disorder: Secondary | ICD-10-CM

## 2015-12-15 LAB — COMPREHENSIVE METABOLIC PANEL
ALK PHOS: 57 U/L (ref 39–117)
ALT: 15 U/L (ref 0–35)
AST: 16 U/L (ref 0–37)
Albumin: 4.7 g/dL (ref 3.5–5.2)
BILIRUBIN TOTAL: 0.5 mg/dL (ref 0.2–1.2)
BUN: 12 mg/dL (ref 6–23)
CO2: 28 mEq/L (ref 19–32)
Calcium: 9.8 mg/dL (ref 8.4–10.5)
Chloride: 105 mEq/L (ref 96–112)
Creatinine, Ser: 0.72 mg/dL (ref 0.40–1.20)
GFR: 84.52 mL/min (ref >60.00)
GLUCOSE: 90 mg/dL (ref 70–99)
Potassium: 4.3 mEq/L (ref 3.5–5.1)
Sodium: 143 mEq/L (ref 135–145)
TOTAL PROTEIN: 7.2 g/dL (ref 6.0–8.3)

## 2015-12-15 MED ORDER — MELOXICAM 15 MG PO TABS: 15.0000 mg | ORAL_TABLET | Freq: Every day | ORAL | Status: DC

## 2015-12-15 MED ORDER — PANTOPRAZOLE SODIUM 40 MG PO TBEC: 40.0000 mg | DELAYED_RELEASE_TABLET | Freq: Every day | ORAL | Status: DC

## 2015-12-15 MED ORDER — CYANOCOBALAMIN 1000 MCG/ML IJ SOLN: 1000.0000 ug | INTRAMUSCULAR | Status: DC

## 2015-12-15 MED ORDER — ALPRAZOLAM 0.25 MG PO TABS: 0.2500 mg | ORAL_TABLET | Freq: Two times a day (BID) | ORAL | Status: DC | PRN

## 2015-12-15 MED ORDER — ESCITALOPRAM OXALATE 10 MG PO TABS: 10.0000 mg | ORAL_TABLET | Freq: Every day | ORAL | Status: DC

## 2015-12-15 NOTE — Progress Notes (Signed)
Subjective:  Patient ID: Samantha Hanna, female    DOB: [birth year]  Age: 75 y.o. MRN: [id number]  CC: The primary encounter diagnosis was Long-term use of high-risk medication. Diagnoses of B12 deficiency, Anxiety state, and Caregiver with fatigue were also pertinent to this visit.  HPI Samantha Hanna presents for multiple somatic symptoms c/w GAD/depression.  Family has voiced concern about her mood disorder manifesting as somatic complaints.   Stopped taking lexapro but can't remember why  Stopped the Pamelor,  Not having headaches daily .  Did not tolerate meds  Having hot flashes and panic attacks.  "I borrow trouble."  Worries about Samantha Hanna.   Outpatient Prescriptions Prior to Visit  Medication Sig Dispense Refill  . cholecalciferol (VITAMIN D) 1000 UNITS tablet Take 1,000 Units by mouth daily.      Marland Kitchen co-enzyme Q-10 30 MG capsule Take 30 mg by mouth 3 (three) times daily.      . fish oil-omega-3 fatty acids 1000 MG capsule Take 2 g by mouth daily.      . fluticasone (FLONASE) 50 MCG/ACT nasal spray     . FOLIC ACID PO Take by mouth.      . levothyroxine (SYNTHROID) 75 MCG tablet Take 1 tablet (75 mcg total) by mouth daily before breakfast. NAME BRAND ONLY 90 tablet 1  . Probiotic Product (PROBIOTIC DAILY PO) Take 1 tablet by mouth daily.    . Syringe, Disposable, 1 ML MISC For daily/weekly/monthly B12 injections 25 each 2  . cyanocobalamin (,VITAMIN B-12,) 1000 MCG/ML injection For daily/weekly/monthly use per MD directions for B12 deficiency 10 mL 3  . meloxicam (MOBIC) 15 MG tablet     . pantoprazole (PROTONIX) 40 MG tablet TAKE 1 TABLET EVERY DAY 30 tablet 5  . escitalopram (LEXAPRO) 10 MG tablet Take 1 tablet (10 mg total) by mouth daily. (Patient not taking: Reported on 12/15/2015) 30 tablet 2   No facility-administered medications prior to visit.    Review of Systems;  Patient deniesfevers, , unintentional weight loss, skin rash, eye pain, sinus congestion and sinus pain, sore  throat, dysphagia,  hemoptysis , cough, dyspnea, wheezing, chest pain, palpitations, orthopnea, edema, abdominal pain, nausea, melena, diarrhea, constipation, flank pain, dysuria, hematuria, urinary  Frequency, nocturia, numbness, tingling, seizures,  Focal weakness, Loss of consciousness,  Tremor,  and suicidal ideation.      Objective:  BP 120/76 mmHg  Pulse 65  Temp(Src) 97.8 F (36.6 C) (Oral)  Resp 12  Ht '5\' 7"'$  (1.702 m)  Wt 147 lb 8 oz (66.906 kg)  BMI 23.10 kg/m2  SpO2 98%  BP Readings from Last 3 Encounters:  12/15/15 120/76  06/16/15 122/74  05/12/15 140/80    Wt Readings from Last 3 Encounters:  12/15/15 147 lb 8 oz (66.906 kg)  06/16/15 144 lb 4 oz (65.431 kg)  05/12/15 143 lb (64.864 kg)    General appearance: alert, cooperative and appears stated age Ears: normal TM's and external ear canals both ears Throat: lips, mucosa, and tongue normal; teeth and gums normal Neck: no adenopathy, no carotid bruit, supple, symmetrical, trachea midline and thyroid not enlarged, symmetric, no tenderness/mass/nodules Back: symmetric, no curvature. ROM normal. No CVA tenderness. Lungs: clear to auscultation bilaterally Heart: regular rate and rhythm, S1, S2 normal, no murmur, click, rub or gallop Abdomen: soft, non-tender; bowel sounds normal; no masses,  no organomegaly Pulses: 2+ and symmetric Skin: Skin color, texture, turgor normal. No rashes or lesions Lymph nodes: Cervical, supraclavicular, and axillary nodes normal.  No results found for: HGBA1C  Lab Results  Component Value Date   CREATININE 0.72 12/15/2015   CREATININE 0.86 04/17/2015   CREATININE 0.82 01/25/2015    Lab Results  Component Value Date   WBC 7.6 04/17/2015   HGB 13.8 04/17/2015   HCT 41.2 04/17/2015   PLT 217.0 04/17/2015   GLUCOSE 90 12/15/2015   CHOL 259* 09/08/2013   TRIG 70.0 09/08/2013   HDL 66.80 09/08/2013   LDLDIRECT 172.6 09/08/2013   ALT 15 12/15/2015   AST 16 12/15/2015   NA  143 12/15/2015   K 4.3 12/15/2015   CL 105 12/15/2015   CREATININE 0.72 12/15/2015   BUN 12 12/15/2015   CO2 28 12/15/2015   TSH 1.350 12/04/2015      Assessment & Plan:   Problem List Items Addressed This Visit    Caregiver with fatigue    Spent over 25 minutes discussing her current method of managing her husband's diagnosis and prognosis and advised her to resume her normal activities  And interests to avoid burnout        Anxiety state    Advised to resume daily lexapro starting at 5 mg.  Prn alprazolam RE for once daily use /      Relevant Medications   escitalopram (LEXAPRO) 10 MG tablet   ALPRAZolam (XANAX) 0.25 MG tablet   B12 deficiency   Relevant Medications   cyanocobalamin (,VITAMIN B-12,) 1000 MCG/ML injection    Other Visit Diagnoses    Long-term use of high-risk medication    -  Primary    Relevant Orders    Comp Met (CMET) (Completed)       I have changed Samantha Hanna's meloxicam, pantoprazole, and cyanocobalamin. I am also having her start on ALPRAZolam. Additionally, I am having her maintain her fish oil-omega-3 fatty acids, cholecalciferol, co-enzyme Y-65, FOLIC ACID PO, Probiotic Product (PROBIOTIC DAILY PO), fluticasone, Syringe (Disposable), levothyroxine, and escitalopram.  Meds ordered this encounter  Medications  . escitalopram (LEXAPRO) 10 MG tablet    Sig: Take 1 tablet (10 mg total) by mouth daily.    Dispense:  90 tablet    Refill:  1  . meloxicam (MOBIC) 15 MG tablet    Sig: Take 1 tablet (15 mg total) by mouth daily. As needed for joint pain    Dispense:  90 tablet    Refill:  1  . pantoprazole (PROTONIX) 40 MG tablet    Sig: Take 1 tablet (40 mg total) by mouth daily.    Dispense:  90 tablet    Refill:  1  . cyanocobalamin (,VITAMIN B-12,) 1000 MCG/ML injection    Sig: Inject 1 mL (1,000 mcg total) into the muscle every 14 (fourteen) days. For daily/weekly/monthly use per MD directions for B12 deficiency    Dispense:  10 mL     Refill:  11  . ALPRAZolam (XANAX) 0.25 MG tablet    Sig: Take 1 tablet (0.25 mg total) by mouth 2 (two) times daily as needed for anxiety or sleep.    Dispense:  30 tablet    Refill:  4    Medications Discontinued During This Encounter  Medication Reason  . escitalopram (LEXAPRO) 10 MG tablet Reorder  . meloxicam (MOBIC) 15 MG tablet Reorder  . pantoprazole (PROTONIX) 40 MG tablet Reorder  . cyanocobalamin (,VITAMIN B-12,) 1000 MCG/ML injection Reorder   A total of 25 minutes of face to face time was spent with patient more than half of which was spent in  counselling about the above mentioned conditions  and coordination of care  Follow-up: Return in about 4 weeks (around 01/12/2016).   Crecencio Mc, MD

## 2015-12-15 NOTE — Patient Instructions (Signed)
I'm glad you came today!  You are suffering from Generalized anxiety .  I would like you to resume the lexapro that you were taking last fall when I saw you.  Please start the Lexapro (escitalopram) at 1/2 tablet daily in the evening for the first few days to avoid nausea.  You can increase to a full tablet after 4 days if you havenot developed side effects of nausea.  If the lexapro interferes with your sleep, take it in the morning instead  I have also prescribed alprazolam to help when you feel  You are having a panic attack or are unable to fall asleep on your own.  I also recommend taking melatonin daily because over time it will help regulate your sleep/wake cycles.   I also would resume the B12 injectiions every 2 weeks for now    Resume the pantoprazole for your gastritis.    Resume meloxicam AS NEEDED FOR JOINT PAIN    I would like to  see you again in   4 weeks ,  Or e mail me to let me know how it is helping your depression

## 2015-12-15 NOTE — Progress Notes (Signed)
Pre-visit discussion using our clinic review tool. No additional management support is needed unless otherwise documented below in the visit note.  

## 2015-12-16 NOTE — Assessment & Plan Note (Signed)
Spent over 25 minutes discussing her current method of managing her husband's diagnosis and prognosis and advised her to resume her normal activities  And interests to avoid burnout 

## 2015-12-16 NOTE — Assessment & Plan Note (Signed)
Advised to resume daily lexapro starting at 5 mg.  Prn alprazolam RE for once daily use /

## 2015-12-17 ENCOUNTER — Encounter: Payer: Self-pay | Admitting: Internal Medicine

## 2015-12-25 DIAGNOSIS — H353131 Nonexudative age-related macular degeneration, bilateral, early dry stage: Secondary | ICD-10-CM | POA: Diagnosis not present

## 2015-12-25 DIAGNOSIS — H2513 Age-related nuclear cataract, bilateral: Secondary | ICD-10-CM | POA: Diagnosis not present

## 2016-01-01 ENCOUNTER — Encounter: Payer: Self-pay | Admitting: Internal Medicine

## 2016-01-05 DIAGNOSIS — J3489 Other specified disorders of nose and nasal sinuses: Secondary | ICD-10-CM | POA: Diagnosis not present

## 2016-01-08 ENCOUNTER — Encounter: Payer: Self-pay | Admitting: Family Medicine

## 2016-01-08 ENCOUNTER — Ambulatory Visit (INDEPENDENT_AMBULATORY_CARE_PROVIDER_SITE_OTHER): Payer: Medicare Other | Admitting: Family Medicine

## 2016-01-08 ENCOUNTER — Telehealth: Payer: Self-pay | Admitting: Internal Medicine

## 2016-01-08 VITALS — BP 136/82 | HR 75 | Temp 98.0°F | Ht 67.0 in | Wt 148.2 lb

## 2016-01-08 DIAGNOSIS — H8111 Benign paroxysmal vertigo, right ear: Secondary | ICD-10-CM | POA: Diagnosis not present

## 2016-01-08 DIAGNOSIS — G5602 Carpal tunnel syndrome, left upper limb: Secondary | ICD-10-CM | POA: Diagnosis not present

## 2016-01-08 DIAGNOSIS — H8110 Benign paroxysmal vertigo, unspecified ear: Secondary | ICD-10-CM

## 2016-01-08 DIAGNOSIS — F411 Generalized anxiety disorder: Secondary | ICD-10-CM

## 2016-01-08 DIAGNOSIS — G5600 Carpal tunnel syndrome, unspecified upper limb: Secondary | ICD-10-CM

## 2016-01-08 DIAGNOSIS — M5412 Radiculopathy, cervical region: Secondary | ICD-10-CM | POA: Diagnosis not present

## 2016-01-08 DIAGNOSIS — M542 Cervicalgia: Secondary | ICD-10-CM | POA: Diagnosis not present

## 2016-01-08 HISTORY — DX: Carpal tunnel syndrome, unspecified upper limb: G56.00

## 2016-01-08 HISTORY — DX: Benign paroxysmal vertigo, unspecified ear: H81.10

## 2016-01-08 NOTE — Patient Instructions (Signed)
Nice to meet you. Your symptoms are likely related to be BPPV. We will have you do the Epley maneuvers at home for the right side. Please stay well hydrated. If you develop numbness, weakness, vision changes, persistent dizziness, chest pain, shortness of breath, passing out, or any new or changing symptoms please seek medical attention.

## 2016-01-08 NOTE — Assessment & Plan Note (Addendum)
History and exam consistent with BPPV. Positive Dix-Hallpike on the right. Suspect this is the cause of her vertigo. She also has some sinus congestion and ear fullness that could be contributing. Negative orthostatics. Neurologically intact. Benign ear exam. Discussed Claritin, Flonase, and nasal saline rinses for this. Advised on modified Epley for treatment of BPPV. Given return precautions.

## 2016-01-08 NOTE — Progress Notes (Signed)
Patient ID: YARIS KIBE, female   DOB: 04-14-1941, 75 y.o.   MRN: [id number]  Tommi Rumps, MD Phone: [phone]  Samantha Hanna is a 75 y.o. female who presents today for same day visit.  Patient notes several nights ago she woke up and felt as though the room was spinning after she got out of bed. She got back in bed. This resolved relatively quickly. She notes it has not recurred to that extent again though has had mild vertigo intermittently. Some tinnitus and ear fullness and popping. Some sinus congestion. No hearing changes. No vision changes. Does note some carpal tunnel like symptoms and her left hand with mild numbness in the thenar eminence intermittently, none at this time. She does wear a cock-up splint at night for this. Is beneficial. No numbness elsewhere. No weakness. Patient additionally notes occasionally getting lightheaded in the mornings. Resolves relatively quickly. Notes some hot flashes as well and some arm soreness bilaterally. Notes these symptoms have been going on for several years. She does note these have improved with starting back on her Lexapro. Anxiety is improved as well. No chest pain or shortness of breath.  PMH: nonsmoker.   ROS see history of present illness  Objective  Physical Exam Filed Vitals:   01/08/16 1014  BP: 136/82  Pulse: 75  Temp: 98 F (36.7 C)    BP Readings from Last 3 Encounters:  01/08/16 136/82  12/15/15 120/76  06/16/15 122/74   Wt Readings from Last 3 Encounters:  01/08/16 148 lb 3.2 oz (67.223 kg)  12/15/15 147 lb 8 oz (66.906 kg)  06/16/15 144 lb 4 oz (65.431 kg)   Laying blood pressure 122/84 pulse 61 Sitting blood pressure 124/82 pulse 73 Standing blood pressure 112/80 pulse 75  Physical Exam  Constitutional: She is well-developed, well-nourished, and in no distress.  HENT:  Head: Normocephalic and atraumatic.  Right Ear: External ear normal.  Left Ear: External ear normal.  Mouth/Throat: Oropharynx is  clear and moist. No oropharyngeal exudate.  Eyes: Conjunctivae are normal. Pupils are equal, round, and reactive to light.  Neck: Neck supple.  Cardiovascular: Normal rate, regular rhythm and normal heart sounds.   Pulmonary/Chest: Effort normal and breath sounds normal.  Musculoskeletal: She exhibits no edema.  Positive Tinel's on the left wrist, negative Tinel's on the right, no wrist tenderness or swelling bilaterally, no hand swelling bilaterally, hands are warm and well perfused  Lymphadenopathy:    She has no cervical adenopathy.  Neurological: She is alert.  CN 2-12 intact, 5/5 strength in bilateral biceps, triceps, grip, quads, hamstrings, plantar and dorsiflexion, sensation to light touch intact in bilateral UE and LE, normal gait, 2+ patellar reflexes Positive Dix-Hallpike on the right with nystagmus, negative Dix-Hallpike on the left  Skin: Skin is warm and dry. She is not diaphoretic.     Assessment/Plan: Please see individual problem list.  Benign paroxysmal positional vertigo History and exam consistent with BPPV. Positive Dix-Hallpike on the right. Suspect this is the cause of her vertigo. She also has some sinus congestion and ear fullness that could be contributing. Negative orthostatics. Neurologically intact. Benign ear exam. Discussed Claritin, Flonase, and nasal saline rinses for this. Advised on modified Epley for treatment of BPPV. Given return precautions.  Carpal tunnel syndrome Left hand symptoms most consistent with carpal tunnel syndrome particularly in the setting of a positive Tinel's test. Discussed continuing cock-up splint. If no improvement or if there is worsening would consider referral to orthopedic surgery for  evaluation with nerve conduction studies versus ultrasound.  Anxiety state Anxiety is improved with Lexapro. Somatic complaints have improved as well. She'll continue the Lexapro and continue to monitor. Given return precautions.    Tommi Rumps, MD [place]

## 2016-01-08 NOTE — Assessment & Plan Note (Signed)
Anxiety is improved with Lexapro. Somatic complaints have improved as well. She'll continue the Lexapro and continue to monitor. Given return precautions.

## 2016-01-08 NOTE — Progress Notes (Signed)
Pre visit review using our clinic review tool, if applicable. No additional management support is needed unless otherwise documented below in the visit note. 

## 2016-01-08 NOTE — Assessment & Plan Note (Signed)
Left hand symptoms most consistent with carpal tunnel syndrome particularly in the setting of a positive Tinel's test. Discussed continuing cock-up splint. If no improvement or if there is worsening would consider referral to orthopedic surgery for evaluation with nerve conduction studies versus ultrasound.

## 2016-01-15 ENCOUNTER — Encounter: Payer: Self-pay | Admitting: Internal Medicine

## 2016-01-15 ENCOUNTER — Ambulatory Visit (INDEPENDENT_AMBULATORY_CARE_PROVIDER_SITE_OTHER): Payer: Medicare Other | Admitting: Internal Medicine

## 2016-01-15 VITALS — BP 118/70 | HR 67 | Temp 97.8°F | Resp 12 | Ht 67.0 in | Wt 146.8 lb

## 2016-01-15 DIAGNOSIS — R5381 Other malaise: Secondary | ICD-10-CM | POA: Diagnosis not present

## 2016-01-15 DIAGNOSIS — R5383 Other fatigue: Secondary | ICD-10-CM

## 2016-01-15 DIAGNOSIS — F329 Major depressive disorder, single episode, unspecified: Secondary | ICD-10-CM | POA: Diagnosis not present

## 2016-01-15 DIAGNOSIS — F32A Depression, unspecified: Secondary | ICD-10-CM

## 2016-01-15 DIAGNOSIS — N951 Menopausal and female climacteric states: Secondary | ICD-10-CM

## 2016-01-15 DIAGNOSIS — Z1239 Encounter for other screening for malignant neoplasm of breast: Secondary | ICD-10-CM

## 2016-01-15 MED ORDER — MIRTAZAPINE 7.5 MG PO TABS: 7.5000 mg | ORAL_TABLET | Freq: Every day | ORAL | Status: DC

## 2016-01-15 NOTE — Assessment & Plan Note (Signed)
Secondary to anxiety/depression. Lack of appetite and energy addressed with trial of Remeron.  Encouragement given.

## 2016-01-15 NOTE — Patient Instructions (Signed)
We are adding generic Remeron  For your fatigue and lack of appetite  Start with 7.5 mg daily at bedtime  You may increase to 15 mg daily after one week if you feel no difference  You have not had an annual  wellness exam since 2013! Please schedule this soon!  Mirtazapine tablets What is this medicine? MIRTAZAPINE (mir TAZ a peen) is used to treat depression. This medicine may be used for other purposes; ask your health care provider or pharmacist if you have questions. What should I tell my health care provider before I take this medicine? They need to know if you have any of these conditions: -bipolar disorder -glaucoma -kidney disease -liver disease -suicidal thoughts -an unusual or allergic reaction to mirtazapine, other medicines, foods, dyes, or preservatives -pregnant or trying to get pregnant -breast-feeding How should I use this medicine? Take this medicine by mouth with a glass of water. Follow the directions on the prescription label. Take your medicine at regular intervals. Do not take your medicine more often than directed. Do not stop taking this medicine suddenly except upon the advice of your doctor. Stopping this medicine too quickly may cause serious side effects or your condition may worsen. A special MedGuide will be given to you by the pharmacist with each prescription and refill. Be sure to read this information carefully each time. Talk to your pediatrician regarding the use of this medicine in children. Special care may be needed. Overdosage: If you think you have taken too much of this medicine contact a poison control center or emergency room at once. NOTE: This medicine is only for you. Do not share this medicine with others. What if I miss a dose? If you miss a dose, take it as soon as you can. If it is almost time for your next dose, take only that dose. Do not take double or extra doses. What may interact with this medicine? Do not take this medicine with  any of the following medications: -linezolid -MAOIs like Carbex, Eldepryl, Marplan, Nardil, and Parnate -methylene blue (injected into a vein) This medicine may also interact with the following medications: -alcohol -antiviral medicines for HIV or AIDS -certain medicines that treat or prevent blood clots like warfarin -certain medicines for depression, anxiety, or psychotic disturbances -certain medicines for fungal infections like ketoconazole and itraconazole -certain medicines for migraine headache like almotriptan, eletriptan, frovatriptan, naratriptan, rizatriptan, sumatriptan, zolmitriptan -certain medicines for seizures like carbamazepine or phenytoin -certain medicines for sleep -cimetidine -erythromycin -fentanyl -lithium -medicines for blood pressure -nefazodone -rasagiline -rifampin -supplements like St. John's wort, kava kava, valerian -tramadol -tryptophan This list may not describe all possible interactions. Give your health care provider a list of all the medicines, herbs, non-prescription drugs, or dietary supplements you use. Also tell them if you smoke, drink alcohol, or use illegal drugs. Some items may interact with your medicine. What should I watch for while using this medicine? Tell your doctor if your symptoms do not get better or if they get worse. Visit your doctor or health care professional for regular checks on your progress. Because it may take several weeks to see the full effects of this medicine, it is important to continue your treatment as prescribed by your doctor. Patients and their families should watch out for new or worsening thoughts of suicide or depression. Also watch out for sudden changes in feelings such as feeling anxious, agitated, panicky, irritable, hostile, aggressive, impulsive, severely restless, overly excited and hyperactive, or not being able  to sleep. If this happens, especially at the beginning of treatment or after a change in  dose, call your health care professional. Dennis Bast may get drowsy or dizzy. Do not drive, use machinery, or do anything that needs mental alertness until you know how this medicine affects you. Do not stand or sit up quickly, especially if you are an older patient. This reduces the risk of dizzy or fainting spells. Alcohol may interfere with the effect of this medicine. Avoid alcoholic drinks. This medicine may cause dry eyes and blurred vision. If you wear contact lenses you may feel some discomfort. Lubricating drops may help. See your eye doctor if the problem does not go away or is severe. Your mouth may get dry. Chewing sugarless gum or sucking hard candy, and drinking plenty of water may help. Contact your doctor if the problem does not go away or is severe. What side effects may I notice from receiving this medicine? Side effects that you should report to your doctor or health care professional as soon as possible: -allergic reactions like skin rash, itching or hives, swelling of the face, lips, or tongue -breathing problems -confusion -fever, sore throat, or mouth ulcers or blisters -flu like symptoms including fever, chills, cough, muscle or joint aches and pains -stomach pain with nausea and/or vomiting -suicidal thoughts or other mood changes -swelling of the hands or feet -unusual bleeding or bruising -unusually weak or tired -vomiting Side effects that usually do not require medical attention (report to your doctor or health care professional if they continue or are bothersome): -constipation -increased appetite -weight gain This list may not describe all possible side effects. Call your doctor for medical advice about side effects. You may report side effects to FDA at 1-800-FDA-1088. Where should I keep my medicine? Keep out of the reach of children. Store at room temperature between 15 and 30 degrees C (59 and 86 degrees F) Protect from light and moisture. Throw away any unused  medicine after the expiration date. NOTE: This sheet is a summary. It may not cover all possible information. If you have questions about this medicine, talk to your doctor, pharmacist, or health care provider.    2016, [employer]vier/[employer] Standard. (2013-03-05 13:11:19)

## 2016-01-15 NOTE — Progress Notes (Signed)
Subjective:  Patient ID: Samantha Hanna, female    DOB: 1941/03/20  Age: 75 y.o. MRN: [id number]  CC: The primary encounter diagnosis was Breast cancer screening. Diagnoses of Symptoms, such as flushing, sleeplessness, headache, lack of concentration, associated with the menopause, Malaise and fatigue, and Depression were also pertinent to this visit.  HPI Samantha Hanna presents for FOLLOW UP on depression with anxiety,  recebnt episode of vertigo treated by ES  Using flonase and saline rinses.    Epley maneuver .    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 Present with soreness and crusting of inside of nose at the tip. ,  symptoms have improved and sores are diminishing in size.    For the past 2 weeks for staph infection that present with soreness and redness to inside of nose. .     Recurrent warmth and tingling involving the left side . has not resumed swimming.  Saw Dr Sabra Heck again.    Depression/anxiety/somatic complaints:  Taking lexapro since April 21 .  Feeling better.    Still feels like she is moving in slow motion for the first hour of the morning.this sympotm is getting a little better.   Sinuses congested in the morning Sleeping better,  Wakes up frequently at 4 am or later  , feels tingling and warm on the left side.  Has cervical spinal stenosis  At multiple levels     Outpatient Prescriptions Prior to Visit  Medication Sig Dispense Refill  . ALPRAZolam (XANAX) 0.25 MG tablet Take 1 tablet (0.25 mg total) by mouth 2 (two) times daily as needed for anxiety or sleep. 30 tablet 4  . cholecalciferol (VITAMIN D) 1000 UNITS tablet Take 1,000 Units by mouth daily.      Marland Kitchen co-enzyme Q-10 30 MG capsule Take 30 mg by mouth 3 (three) times daily.      . cyanocobalamin (,VITAMIN B-12,) 1000 MCG/ML injection Inject 1 mL (1,000 mcg total) into the muscle every 14 (fourteen) days. For daily/weekly/monthly use per MD directions for B12 deficiency 10 mL 11  .  escitalopram (LEXAPRO) 10 MG tablet Take 1 tablet (10 mg total) by mouth daily. 90 tablet 1  . fish oil-omega-3 fatty acids 1000 MG capsule Take 2 g by mouth daily.      . fluticasone (FLONASE) 50 MCG/ACT nasal spray     . FOLIC ACID PO Take by mouth.      . levothyroxine (SYNTHROID) 75 MCG tablet Take 1 tablet (75 mcg total) by mouth daily before breakfast. NAME BRAND ONLY 90 tablet 1  . meloxicam (MOBIC) 15 MG tablet Take 1 tablet (15 mg total) by mouth daily. As needed for joint pain 90 tablet 1  . pantoprazole (PROTONIX) 40 MG tablet Take 1 tablet (40 mg total) by mouth daily. 90 tablet 1  . Probiotic Product (PROBIOTIC DAILY PO) Take 1 tablet by mouth daily.    . Syringe, Disposable, 1 ML MISC For daily/weekly/monthly B12 injections 25 each 2   No facility-administered medications prior to visit.    Review of Systems;  Patient denies headache, fevers, malaise, unintentional weight loss, skin rash, eye pain, sinus congestion and sinus pain, sore throat, dysphagia,  hemoptysis , cough, dyspnea, wheezing, chest pain, palpitations, orthopnea, edema, abdominal pain, nausea, melena, diarrhea, constipation, flank pain, dysuria, hematuria, urinary  Frequency, nocturia, numbness, tingling, seizures,  Focal weakness, Loss of consciousness,  Tremor, insomnia, depression, anxiety, and suicidal ideation.  Objective:  BP 118/70 mmHg  Pulse 67  Temp(Src) 97.8 F (36.6 C) (Oral)  Resp 12  Ht 5\' 7"  (1.702 m)  Wt 146 lb 12 oz (66.565 kg)  BMI 22.98 kg/m2  SpO2 97%  BP Readings from Last 3 Encounters:  01/15/16 118/70  01/08/16 136/82  12/15/15 120/76    Wt Readings from Last 3 Encounters:  01/15/16 146 lb 12 oz (66.565 kg)  01/08/16 148 lb 3.2 oz (67.223 kg)  12/15/15 147 lb 8 oz (66.906 kg)    General appearance: alert, cooperative and appears stated age Ears: normal TM's and external ear canals both ears Throat: lips, mucosa, and tongue normal; teeth and gums normal Neck: no  adenopathy, no carotid bruit, supple, symmetrical, trachea midline and thyroid not enlarged, symmetric, no tenderness/mass/nodules Back: symmetric, no curvature. ROM normal. No CVA tenderness. Lungs: clear to auscultation bilaterally Heart: regular rate and rhythm, S1, S2 normal, no murmur, click, rub or gallop Abdomen: soft, non-tender; bowel sounds normal; no masses,  no organomegaly Pulses: 2+ and symmetric Skin: Skin color, texture, turgor normal. No rashes or lesions Lymph nodes: Cervical, supraclavicular, and axillary nodes normal.  No results found for: HGBA1C  Lab Results  Component Value Date   CREATININE 0.72 12/15/2015   CREATININE 0.86 04/17/2015   CREATININE 0.82 01/25/2015    Lab Results  Component Value Date   WBC 7.6 04/17/2015   HGB 13.8 04/17/2015   HCT 41.2 04/17/2015   PLT 217.0 04/17/2015   GLUCOSE 90 12/15/2015   CHOL 259* 09/08/2013   TRIG 70.0 09/08/2013   HDL 66.80 09/08/2013   LDLDIRECT 172.6 09/08/2013   ALT 15 12/15/2015   AST 16 12/15/2015   NA 143 12/15/2015   K 4.3 12/15/2015   CL 105 12/15/2015   CREATININE 0.72 12/15/2015   BUN 12 12/15/2015   CO2 28 12/15/2015   TSH 1.350 12/04/2015    US Abdomen Complete  05/15/2015  CLINICAL DATA:  Night sweats.  Vasomotor flushing. EXAM: ULTRASOUND ABDOMEN COMPLETE COMPARISON:  None. FINDINGS: Gallbladder: No gallstones or wall thickening visualized. No sonographic Murphy sign noted. Common bile duct: Diameter: 4 mm, within normal limits. Liver: No focal lesion identified. Within normal limits in parenchymal echogenicity. IVC: No abnormality visualized. Pancreas: Visualized portion unremarkable. Spleen: Size and appearance within normal limits. Right Kidney: Length: 10.3 cm. Echogenicity within normal limits. No mass or hydronephrosis visualized. Left Kidney: Length: 11.4 cm. Echogenicity within normal limits. No mass or hydronephrosis visualized. Several small parapelvic renal cysts are seen. Abdominal  aorta: No aneurysm visualized. Other findings: None. IMPRESSION: No evidence of gallstones, biliary dilatation, or other acute findings. Small left renal parapelvic cysts incidentally noted. No evidence of hydronephrosis. Electronically Signed   By: Earle Gell M.D.   On: 05/15/2015 11:04    Assessment & Plan:   Problem List Items Addressed This Visit    Malaise and fatigue    Secondary to anxiety/depression. Lack of appetite and energy addressed with trial of Remeron.  Encouragement given.       Symptoms, such as flushing, sleeplessness, headache, lack of concentration, associated with the menopause    All are improving with lexapro .  No changes to regimen today.       Depression    She continues to have anhedonia, lack of motivation,  No appetite,  And fatigue.  Adding remeron .       Relevant Medications   mirtazapine (REMERON) 7.5 MG tablet    Other Visit Diagnoses  Breast cancer screening    -  Primary    Relevant Orders    MM Digital Screening     A total of 25 minutes of face to face time was spent with patient more than half of which was spent in counselling about the above mentioned conditions  and coordination of care   I am having Ms. Molenda start on mirtazapine. I am also having her maintain her fish oil-omega-3 fatty acids, cholecalciferol, co-enzyme 0000000, FOLIC ACID PO, Probiotic Product (PROBIOTIC DAILY PO), fluticasone, Syringe (Disposable), levothyroxine, escitalopram, meloxicam, pantoprazole, cyanocobalamin, and ALPRAZolam.  Meds ordered this encounter  Medications  . mirtazapine (REMERON) 7.5 MG tablet    Sig: Take 1 tablet (7.5 mg total) by mouth at bedtime.    Dispense:  90 tablet    Refill:  1    There are no discontinued medications.  Follow-up: No Follow-up on file.   Crecencio Mc, MD

## 2016-01-15 NOTE — Assessment & Plan Note (Signed)
All are improving with lexapro .  No changes to regimen today.

## 2016-01-15 NOTE — Assessment & Plan Note (Signed)
" >>  ASSESSMENT AND PLAN FOR VASCULAR DEMENTIA ([facility]CC) WRITTEN ON 01/15/2016  1:15 PM BY Cherith Tewell L, MD  Secondary to anxiety/depression. Lack of appetite and energy addressed with trial of Remeron .  Encouragement given.  "

## 2016-01-15 NOTE — Assessment & Plan Note (Signed)
She continues to have anhedonia, lack of motivation,  No appetite,  And fatigue.  Adding remeron .

## 2016-01-15 NOTE — Progress Notes (Signed)
Pre-visit discussion using our clinic review tool. No additional management support is needed unless otherwise documented below in the visit note.  

## 2016-01-16 DIAGNOSIS — Z1231 Encounter for screening mammogram for malignant neoplasm of breast: Secondary | ICD-10-CM | POA: Diagnosis not present

## 2016-01-16 LAB — HM MAMMOGRAPHY

## 2016-01-19 DIAGNOSIS — M545 Low back pain: Secondary | ICD-10-CM | POA: Diagnosis not present

## 2016-01-19 DIAGNOSIS — M5412 Radiculopathy, cervical region: Secondary | ICD-10-CM | POA: Diagnosis not present

## 2016-01-24 ENCOUNTER — Encounter: Payer: Self-pay | Admitting: Internal Medicine

## 2016-01-24 DIAGNOSIS — M542 Cervicalgia: Secondary | ICD-10-CM | POA: Diagnosis not present

## 2016-01-24 DIAGNOSIS — M5412 Radiculopathy, cervical region: Secondary | ICD-10-CM | POA: Diagnosis not present

## 2016-01-26 DIAGNOSIS — M5412 Radiculopathy, cervical region: Secondary | ICD-10-CM | POA: Diagnosis not present

## 2016-01-26 DIAGNOSIS — M542 Cervicalgia: Secondary | ICD-10-CM | POA: Diagnosis not present

## 2016-01-29 ENCOUNTER — Ambulatory Visit: Payer: Medicare Other | Admitting: Neurology

## 2016-01-31 DIAGNOSIS — M542 Cervicalgia: Secondary | ICD-10-CM | POA: Diagnosis not present

## 2016-01-31 DIAGNOSIS — M5412 Radiculopathy, cervical region: Secondary | ICD-10-CM | POA: Diagnosis not present

## 2016-02-02 ENCOUNTER — Ambulatory Visit: Payer: Medicare Other | Admitting: Neurology

## 2016-02-02 DIAGNOSIS — M5412 Radiculopathy, cervical region: Secondary | ICD-10-CM | POA: Diagnosis not present

## 2016-02-02 DIAGNOSIS — M542 Cervicalgia: Secondary | ICD-10-CM | POA: Diagnosis not present

## 2016-02-05 DIAGNOSIS — M5412 Radiculopathy, cervical region: Secondary | ICD-10-CM | POA: Diagnosis not present

## 2016-02-05 DIAGNOSIS — M542 Cervicalgia: Secondary | ICD-10-CM | POA: Diagnosis not present

## 2016-02-08 DIAGNOSIS — M5412 Radiculopathy, cervical region: Secondary | ICD-10-CM | POA: Diagnosis not present

## 2016-02-08 DIAGNOSIS — M542 Cervicalgia: Secondary | ICD-10-CM | POA: Diagnosis not present

## 2016-02-13 DIAGNOSIS — M5412 Radiculopathy, cervical region: Secondary | ICD-10-CM | POA: Diagnosis not present

## 2016-02-13 DIAGNOSIS — M542 Cervicalgia: Secondary | ICD-10-CM | POA: Diagnosis not present

## 2016-02-15 DIAGNOSIS — M5412 Radiculopathy, cervical region: Secondary | ICD-10-CM | POA: Diagnosis not present

## 2016-02-15 DIAGNOSIS — M542 Cervicalgia: Secondary | ICD-10-CM | POA: Diagnosis not present

## 2016-02-21 DIAGNOSIS — M5412 Radiculopathy, cervical region: Secondary | ICD-10-CM | POA: Diagnosis not present

## 2016-02-21 DIAGNOSIS — M542 Cervicalgia: Secondary | ICD-10-CM | POA: Diagnosis not present

## 2016-03-06 DIAGNOSIS — L570 Actinic keratosis: Secondary | ICD-10-CM | POA: Diagnosis not present

## 2016-03-06 DIAGNOSIS — Z85828 Personal history of other malignant neoplasm of skin: Secondary | ICD-10-CM | POA: Diagnosis not present

## 2016-03-06 DIAGNOSIS — Z08 Encounter for follow-up examination after completed treatment for malignant neoplasm: Secondary | ICD-10-CM | POA: Diagnosis not present

## 2016-03-06 DIAGNOSIS — X32XXXA Exposure to sunlight, initial encounter: Secondary | ICD-10-CM | POA: Diagnosis not present

## 2016-03-06 DIAGNOSIS — Z85820 Personal history of malignant melanoma of skin: Secondary | ICD-10-CM | POA: Diagnosis not present

## 2016-03-06 DIAGNOSIS — Z09 Encounter for follow-up examination after completed treatment for conditions other than malignant neoplasm: Secondary | ICD-10-CM | POA: Diagnosis not present

## 2016-03-06 DIAGNOSIS — L910 Hypertrophic scar: Secondary | ICD-10-CM | POA: Diagnosis not present

## 2016-03-06 DIAGNOSIS — L298 Other pruritus: Secondary | ICD-10-CM | POA: Diagnosis not present

## 2016-03-08 DIAGNOSIS — M542 Cervicalgia: Secondary | ICD-10-CM | POA: Diagnosis not present

## 2016-03-08 DIAGNOSIS — M5412 Radiculopathy, cervical region: Secondary | ICD-10-CM | POA: Diagnosis not present

## 2016-03-13 DIAGNOSIS — M542 Cervicalgia: Secondary | ICD-10-CM | POA: Diagnosis not present

## 2016-03-13 DIAGNOSIS — M5412 Radiculopathy, cervical region: Secondary | ICD-10-CM | POA: Diagnosis not present

## 2016-03-20 DIAGNOSIS — M5412 Radiculopathy, cervical region: Secondary | ICD-10-CM | POA: Diagnosis not present

## 2016-03-20 DIAGNOSIS — M542 Cervicalgia: Secondary | ICD-10-CM | POA: Diagnosis not present

## 2016-03-21 ENCOUNTER — Encounter: Payer: Self-pay | Admitting: Internal Medicine

## 2016-03-21 ENCOUNTER — Ambulatory Visit (INDEPENDENT_AMBULATORY_CARE_PROVIDER_SITE_OTHER): Payer: Medicare Other | Admitting: Internal Medicine

## 2016-03-21 ENCOUNTER — Ambulatory Visit: Payer: Medicare Other

## 2016-03-21 VITALS — BP 136/78 | HR 56 | Temp 97.9°F | Resp 16 | Wt 147.0 lb

## 2016-03-21 DIAGNOSIS — E034 Atrophy of thyroid (acquired): Secondary | ICD-10-CM

## 2016-03-21 DIAGNOSIS — F32A Depression, unspecified: Secondary | ICD-10-CM

## 2016-03-21 DIAGNOSIS — M5090 Cervical disc disorder, unspecified, unspecified cervical region: Secondary | ICD-10-CM

## 2016-03-21 DIAGNOSIS — Z0000 Encounter for general adult medical examination without abnormal findings: Secondary | ICD-10-CM | POA: Diagnosis not present

## 2016-03-21 DIAGNOSIS — Z780 Asymptomatic menopausal state: Secondary | ICD-10-CM | POA: Diagnosis not present

## 2016-03-21 DIAGNOSIS — E038 Other specified hypothyroidism: Secondary | ICD-10-CM | POA: Diagnosis not present

## 2016-03-21 DIAGNOSIS — F329 Major depressive disorder, single episode, unspecified: Secondary | ICD-10-CM

## 2016-03-21 MED ORDER — TRAMADOL HCL 50 MG PO TABS: 50.0000 mg | ORAL_TABLET | Freq: Three times a day (TID) | ORAL | 0 refills | Status: DC | PRN

## 2016-03-21 MED ORDER — PREDNISONE 10 MG PO TABS: ORAL_TABLET | ORAL | 0 refills | Status: DC

## 2016-03-21 NOTE — Patient Instructions (Addendum)
  [name] , Thank you for taking time to come for your Medicare Wellness Visit. I appreciate your ongoing commitment to your health goals. Please review the following plan we discussed and let me know if I can assist you in the future.   Follow up with Dr. Derrel Nip as needed.   This is a list of the screening recommended for you and due dates:  Health Maintenance  Topic Date Due  . Colon Cancer Screening  08/26/2014  . Flu Shot  03/26/2016  . Mammogram  01/15/2018  . Tetanus Vaccine  07/14/2022  . DEXA scan (bone density measurement)  Completed  . Shingles Vaccine  Completed  . Pneumonia vaccines  Completed    For your shoulder and neck pain:   Suspend the meloxicam while you are on the prednisone taper (6 days)  I am adding tramadol for nighttime pain relief,  Ok to add tylenol as well

## 2016-03-21 NOTE — Progress Notes (Signed)
Subjective:  Patient ID: Samantha Hanna, female    DOB: 11-13-1940  Age: 75 y.o. MRN: [id number]  CC: The primary encounter diagnosis was Postmenopausal estrogen deficiency. Diagnoses of Cervical neck pain with evidence of disc disease, Depression, Hypothyroidism due to acquired atrophy of thyroid, and Routine general medical examination at a health care facility were also pertinent to this visit.  HPI DEBBRA REICH presents for follow up on mutiple issues,  Last seen May 22 for wellness  Depression improving , no longer taking remeron . Wt unchanged and BMI normal  Neck pain still problematic, as wel l as left shoulder pain.  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 Wakes up with sigificnat pain in the shoulders and takes several hours to resolve.   Outpatient Medications Prior to Visit  Medication Sig Dispense Refill  . ALPRAZolam (XANAX) 0.25 MG tablet Take 1 tablet (0.25 mg total) by mouth 2 (two) times daily as needed for anxiety or sleep. 30 tablet 4  . cholecalciferol (VITAMIN D) 1000 UNITS tablet Take 1,000 Units by mouth daily.      Marland Kitchen co-enzyme Q-10 30 MG capsule Take 30 mg by mouth 3 (three) times daily.      . cyanocobalamin (,VITAMIN B-12,) 1000 MCG/ML injection Inject 1 mL (1,000 mcg total) into the muscle every 14 (fourteen) days. For daily/weekly/monthly use per MD directions for B12 deficiency 10 mL 11  . escitalopram (LEXAPRO) 10 MG tablet Take 1 tablet (10 mg total) by mouth daily. 90 tablet 1  . fish oil-omega-3 fatty acids 1000 MG capsule Take 2 g by mouth daily.      . fluticasone (FLONASE) 50 MCG/ACT nasal spray     . FOLIC ACID PO Take by mouth.      . levothyroxine (SYNTHROID) 75 MCG tablet Take 1 tablet (75 mcg total) by mouth daily before breakfast. NAME BRAND ONLY 90 tablet 1  . meloxicam (MOBIC) 15 MG tablet Take 1 tablet (15  mg total) by mouth daily. As needed for joint pain 90 tablet 1  . pantoprazole (PROTONIX) 40 MG tablet Take 1 tablet (40 mg total) by mouth daily. 90 tablet 1  . Probiotic Product (PROBIOTIC DAILY PO) Take 1 tablet by mouth daily.    . Syringe, Disposable, 1 ML MISC For daily/weekly/monthly B12 injections 25 each 2  . mirtazapine (REMERON) 7.5 MG tablet Take 1 tablet (7.5 mg total) by mouth at bedtime. (Patient not taking: Reported on 03/21/2016) 90 tablet 1   No facility-administered medications prior to visit.     Review of Systems;  Patient denies headache, fevers, malaise, unintentional weight loss, skin rash, eye pain, sinus congestion and sinus pain, sore throat, dysphagia,  hemoptysis , cough, dyspnea, wheezing, chest pain, palpitations, orthopnea, edema, abdominal pain, nausea, melena, diarrhea, constipation, flank pain, dysuria, hematuria, urinary  Frequency, nocturia, numbness, tingling, seizures,  Focal weakness, Loss of consciousness,  Tremor, insomnia, depression, anxiety, and suicidal ideation.      Objective:  BP 136/78 (BP Location: Right Arm, Patient Position: Sitting, Cuff Size: Normal)   Pulse (!) 56   Temp 97.9 F (36.6 C) (Oral)   Resp 16   Wt 147 lb (66.7 kg)   BMI 23.02 kg/m   BP Readings from Last 3 Encounters:  03/21/16 136/78  01/15/16 118/70  01/08/16  136/82    Wt Readings from Last 3 Encounters:  03/21/16 147 lb (66.7 kg)  01/15/16 146 lb 12 oz (66.6 kg)  01/08/16 148 lb 3.2 oz (67.2 kg)    General appearance: alert, cooperative and appears stated age Ears: normal TM's and external ear canals both ears Throat: lips, mucosa, and tongue normal; teeth and gums normal Neck: no adenopathy, no carotid bruit, supple, symmetrical, trachea midline and thyroid not enlarged, symmetric, no tenderness/mass/nodules Back: symmetric, no curvature. ROM normal. No CVA tenderness. Lungs: clear to auscultation bilaterally Heart: regular rate and rhythm, S1, S2  normal, no murmur, click, rub or gallop Abdomen: soft, non-tender; bowel sounds normal; no masses,  no organomegaly Pulses: 2+ and symmetric Skin: Skin color, texture, turgor normal. No rashes or lesions Lymph nodes: Cervical, supraclavicular, and axillary nodes normal.  No results found for: HGBA1C  Lab Results  Component Value Date   CREATININE 0.72 12/15/2015   CREATININE 0.86 04/17/2015   CREATININE 0.82 01/25/2015    Lab Results  Component Value Date   WBC 7.6 04/17/2015   HGB 13.8 04/17/2015   HCT 41.2 04/17/2015   PLT 217.0 04/17/2015   GLUCOSE 90 12/15/2015   CHOL 259 (H) 09/08/2013   TRIG 70.0 09/08/2013   HDL 66.80 09/08/2013   LDLDIRECT 172.6 09/08/2013   ALT 15 12/15/2015   AST 16 12/15/2015   NA 143 12/15/2015   K 4.3 12/15/2015   CL 105 12/15/2015   CREATININE 0.72 12/15/2015   BUN 12 12/15/2015   CO2 28 12/15/2015   TSH 1.350 12/04/2015    US Abdomen Complete  Result Date: 05/15/2015 CLINICAL DATA:  Night sweats.  Vasomotor flushing. EXAM: ULTRASOUND ABDOMEN COMPLETE COMPARISON:  None. FINDINGS: Gallbladder: No gallstones or wall thickening visualized. No sonographic Murphy sign noted. Common bile duct: Diameter: 4 mm, within normal limits. Liver: No focal lesion identified. Within normal limits in parenchymal echogenicity. IVC: No abnormality visualized. Pancreas: Visualized portion unremarkable. Spleen: Size and appearance within normal limits. Right Kidney: Length: 10.3 cm. Echogenicity within normal limits. No mass or hydronephrosis visualized. Left Kidney: Length: 11.4 cm. Echogenicity within normal limits. No mass or hydronephrosis visualized. Several small parapelvic renal cysts are seen. Abdominal aorta: No aneurysm visualized. Other findings: None. IMPRESSION: No evidence of gallstones, biliary dilatation, or other acute findings. Small left renal parapelvic cysts incidentally noted. No evidence of hydronephrosis. Electronically Signed   By: Earle Gell  M.D.   On: 05/15/2015 11:04    Assessment & Plan:   Problem List Items Addressed This Visit    Routine general medical examination at a health care facility    Annual comprehensive preventive exam was done as well as an evaluation and management of chronic conditions .  During the course of the visit the patient was educated and counseled about appropriate screening and preventive services including :  diabetes screening, lipid analysis with projected  10 year  risk for CAD, nutrition counseling, colorectal cancer screening, and recommended immunizations.  Printed recommendations for health maintenance screenings was given.        Hypothyroidism    Thyroid function is WNL  on current dose of Synnthroid    Lab Results  Component Value Date   TSH 1.350 12/04/2015         Depression    She has improved with trial of remeron .  Sh eis no longer taking it du eto weight gain ad does not want additional therapy      Cervical neck pain  with evidence of disc disease    Trial of prednisone taper, suspend meloxicam .  Tramadol for pain control.  If symptoms persist,  Neurosurgical evaluation      Relevant Medications   predniSONE (DELTASONE) 10 MG tablet   traMADol (ULTRAM) 50 MG tablet    Other Visit Diagnoses    Postmenopausal estrogen deficiency    -  Primary   Relevant Orders   DG Bone Density      I am having Ms. Dinino start on predniSONE and traMADol. I am also having her maintain her fish oil-omega-3 fatty acids, cholecalciferol, co-enzyme 0000000, FOLIC ACID PO, Probiotic Product (PROBIOTIC DAILY PO), fluticasone, Syringe (Disposable), levothyroxine, escitalopram, meloxicam, pantoprazole, cyanocobalamin, ALPRAZolam, and mirtazapine.  Meds ordered this encounter  Medications  . predniSONE (DELTASONE) 10 MG tablet    Sig: 6 tablets on Day 1 , then reduce by 1 tablet daily until gone    Dispense:  21 tablet    Refill:  0  . traMADol (ULTRAM) 50 MG tablet    Sig: Take 1  tablet (50 mg total) by mouth every 8 (eight) hours as needed.    Dispense:  90 tablet    Refill:  0    There are no discontinued medications.  Follow-up: No Follow-up on file.   Crecencio Mc, MD

## 2016-03-21 NOTE — Progress Notes (Signed)
Subjective:   Samantha Hanna is a 75 y.o. female who presents for Medicare Annual (Subsequent) preventive examination.  Review of Systems:  No ROS.  Medicare Wellness Visit.  Cardiac Risk Factors include: advanced age (>40men, >39 women)     Objective:     Vitals: BP 136/78 (BP Location: Right Arm, Patient Position: Sitting, Cuff Size: Normal)   Pulse (!) 56   Temp 97.9 F (36.6 C) (Oral)   Resp 16   Wt 147 lb (66.7 kg)   BMI 23.02 kg/m   Body mass index is 23.02 kg/m.   Tobacco History  Smoking Status  . Never Smoker  Smokeless Tobacco  . Never Used     Counseling given: Not Answered   Past Medical History:  Diagnosis Date  . Hyperlipidemia   . Thyroid disease    Past Surgical History:  Procedure Laterality Date  . MELANOMA EXCISION     x 2, Dr. Sharlett Iles   Family History  Problem Relation Age of Onset  . Osteoporosis Mother     Deceased, 68  . Coronary artery disease Father 71    Deceased, 82 in MVA  . Arthritis Daughter 82    Rheumatiod  . Healthy Daughter    History  Sexual Activity  . Sexual activity: Yes    Outpatient Encounter Prescriptions as of 03/21/2016  Medication Sig  . ALPRAZolam (XANAX) 0.25 MG tablet Take 1 tablet (0.25 mg total) by mouth 2 (two) times daily as needed for anxiety or sleep.  . cholecalciferol (VITAMIN D) 1000 UNITS tablet Take 1,000 Units by mouth daily.    Marland Kitchen co-enzyme Q-10 30 MG capsule Take 30 mg by mouth 3 (three) times daily.    . cyanocobalamin (,VITAMIN B-12,) 1000 MCG/ML injection Inject 1 mL (1,000 mcg total) into the muscle every 14 (fourteen) days. For daily/weekly/monthly use per MD directions for B12 deficiency  . escitalopram (LEXAPRO) 10 MG tablet Take 1 tablet (10 mg total) by mouth daily.  . fish oil-omega-3 fatty acids 1000 MG capsule Take 2 g by mouth daily.    . fluticasone (FLONASE) 50 MCG/ACT nasal spray   . FOLIC ACID PO Take by mouth.    . levothyroxine (SYNTHROID) 75 MCG tablet Take 1 tablet  (75 mcg total) by mouth daily before breakfast. NAME BRAND ONLY  . meloxicam (MOBIC) 15 MG tablet Take 1 tablet (15 mg total) by mouth daily. As needed for joint pain  . pantoprazole (PROTONIX) 40 MG tablet Take 1 tablet (40 mg total) by mouth daily.  . Probiotic Product (PROBIOTIC DAILY PO) Take 1 tablet by mouth daily.  . Syringe, Disposable, 1 ML MISC For daily/weekly/monthly B12 injections  . mirtazapine (REMERON) 7.5 MG tablet Take 1 tablet (7.5 mg total) by mouth at bedtime. (Patient not taking: Reported on 03/21/2016)  . predniSONE (DELTASONE) 10 MG tablet 6 tablets on Day 1 , then reduce by 1 tablet daily until gone  . traMADol (ULTRAM) 50 MG tablet Take 1 tablet (50 mg total) by mouth every 8 (eight) hours as needed.   No facility-administered encounter medications on file as of 03/21/2016.     Activities of Daily Living In your present state of health, do you have any difficulty performing the following activities: 03/21/2016  Hearing? Y  Vision? N  Difficulty concentrating or making decisions? N  Walking or climbing stairs? N  Dressing or bathing? N  Doing errands, shopping? N  Preparing Food and eating ? N  Using the Toilet? N  In the past six months, have you accidently leaked urine? N  Do you have problems with loss of bowel control? N  Managing your Medications? N  Managing your Finances? N  Housekeeping or managing your Housekeeping? N  Some recent data might be hidden    Patient Care Team: Crecencio Mc, MD as PCP - General (Internal Medicine)    Assessment:   This is a routine wellness examination for [facility]. The goal of the wellness visit is to assist the patient how to close the gaps in care and create a preventative care plan for the patient.   Taking calcium VIT D as appropriate/Osteoporosis risk reviewed.  Medications reviewed; taking without issues or barriers.  Safety issues reviewed; smoke detectors in the home. No firearms in the home. Wears  seatbelts when driving or riding with others. No violence in the home.  No identified risk were noted; The patient was oriented x 3; appropriate in dress and manner and no objective failures at ADL's or IADL's.   Patient Concerns: None at this time. Follow up with PCP as needed.  Exercise Activities and Dietary recommendations Current Exercise Habits: Home exercise routine, Type of exercise: walking, Time (Minutes): 45, Frequency (Times/Week): 2, Weekly Exercise (Minutes/Week): 90, Intensity: Mild  Goals    . Increase water intake          Stay hydrated and drink plenty of fluids.  Increase water intake by 2 cups daily.      Fall Risk Fall Risk  03/21/2016  Falls in the past year? No   Depression Screen PHQ 2/9 Scores 03/21/2016 07/15/2012  PHQ - 2 Score 0 0     Cognitive Testing MMSE - Mini Mental State Exam 03/21/2016  Orientation to time 5  Orientation to Place 5  Registration 3  Attention/ Calculation 5  Recall 3  Language- name 2 objects 2  Language- repeat 1  Language- follow 3 step command 3  Language- read & follow direction 1  Write a sentence 1  Copy design 1  Total score 30    Immunization History  Administered Date(s) Administered  . Influenza Split 06/05/2011, 05/26/2012, 06/24/2013, 06/09/2014  . Influenza-Unspecified 05/17/2015  . PPD Test 04/17/2015  . Pneumococcal Conjugate-13 06/16/2014  . Pneumococcal Polysaccharide-23 07/11/2011  . Tdap 07/14/2012  . Zoster 07/16/2010   Screening Tests Health Maintenance  Topic Date Due  . COLONOSCOPY  08/26/2014  . INFLUENZA VACCINE  03/26/2016  . MAMMOGRAM  01/15/2018  . TETANUS/TDAP  07/14/2022  . DEXA SCAN  Completed  . ZOSTAVAX  Completed  . PNA vac Low Risk Adult  Completed      Plan:   End of life planning; Advance aging; Advanced directives discussed. Copy of current HCPOA/Living Will requested.  During the course of the visit the patient was educated and counseled about the following  appropriate screening and preventive services:   Vaccines to include Pneumoccal, Influenza, Hepatitis B, Td, Zostavax, HCV  Electrocardiogram  Cardiovascular Disease  Colorectal cancer screening  Bone density screening  Diabetes screening  Glaucoma screening  Mammography/PAP  Nutrition counseling   Patient Instructions (the written plan) was given to the patient.   Crecencio Mc, MD  03/24/2016

## 2016-03-24 DIAGNOSIS — M5090 Cervical disc disorder, unspecified, unspecified cervical region: Secondary | ICD-10-CM

## 2016-03-24 HISTORY — DX: Cervical disc disorder, unspecified, unspecified cervical region: M50.90

## 2016-03-24 NOTE — Progress Notes (Signed)
  I have reviewed the above information and agree with above.   Analyse Angst, MD 

## 2016-03-24 NOTE — Assessment & Plan Note (Signed)
She has improved with trial of remeron .  Sh eis no longer taking it du eto weight gain ad does not want additional therapy

## 2016-03-24 NOTE — Assessment & Plan Note (Signed)
Thyroid function is WNL  on current dose of Synnthroid    Lab Results  Component Value Date   TSH 1.350 12/04/2015

## 2016-03-24 NOTE — Assessment & Plan Note (Signed)
Trial of prednisone taper, suspend meloxicam .  Tramadol for pain control.  If symptoms persist,  Neurosurgical evaluation

## 2016-03-24 NOTE — Assessment & Plan Note (Signed)
Annual comprehensive preventive exam was done as well as an evaluation and management of chronic conditions .  During the course of the visit the patient was educated and counseled about appropriate screening and preventive services including :  diabetes screening, lipid analysis with projected  10 year  risk for CAD , nutrition counseling, colorectal cancer screening, and recommended immunizations.  Printed recommendations for health maintenance screenings was given.   

## 2016-04-16 ENCOUNTER — Other Ambulatory Visit: Payer: Self-pay | Admitting: Internal Medicine

## 2016-04-16 DIAGNOSIS — M542 Cervicalgia: Secondary | ICD-10-CM | POA: Diagnosis not present

## 2016-04-16 DIAGNOSIS — M5412 Radiculopathy, cervical region: Secondary | ICD-10-CM | POA: Diagnosis not present

## 2016-05-06 DIAGNOSIS — M542 Cervicalgia: Secondary | ICD-10-CM | POA: Diagnosis not present

## 2016-05-06 DIAGNOSIS — M5412 Radiculopathy, cervical region: Secondary | ICD-10-CM | POA: Diagnosis not present

## 2016-05-09 DIAGNOSIS — M542 Cervicalgia: Secondary | ICD-10-CM | POA: Diagnosis not present

## 2016-05-09 DIAGNOSIS — M5412 Radiculopathy, cervical region: Secondary | ICD-10-CM | POA: Diagnosis not present

## 2016-05-15 DIAGNOSIS — Z23 Encounter for immunization: Secondary | ICD-10-CM | POA: Diagnosis not present

## 2016-05-30 DIAGNOSIS — M542 Cervicalgia: Secondary | ICD-10-CM | POA: Diagnosis not present

## 2016-05-30 DIAGNOSIS — M5412 Radiculopathy, cervical region: Secondary | ICD-10-CM | POA: Diagnosis not present

## 2016-06-17 DIAGNOSIS — M5412 Radiculopathy, cervical region: Secondary | ICD-10-CM | POA: Diagnosis not present

## 2016-06-17 DIAGNOSIS — M542 Cervicalgia: Secondary | ICD-10-CM | POA: Diagnosis not present

## 2016-06-26 ENCOUNTER — Telehealth: Payer: Self-pay | Admitting: Internal Medicine

## 2016-06-26 NOTE — Telephone Encounter (Signed)
I called pt and left a vm to call office to sch AWV. Thank you! °

## 2016-07-01 DIAGNOSIS — M5412 Radiculopathy, cervical region: Secondary | ICD-10-CM | POA: Diagnosis not present

## 2016-07-01 DIAGNOSIS — M542 Cervicalgia: Secondary | ICD-10-CM | POA: Diagnosis not present

## 2016-07-29 DIAGNOSIS — M542 Cervicalgia: Secondary | ICD-10-CM | POA: Diagnosis not present

## 2016-07-29 DIAGNOSIS — M5412 Radiculopathy, cervical region: Secondary | ICD-10-CM | POA: Diagnosis not present

## 2016-08-05 DIAGNOSIS — M542 Cervicalgia: Secondary | ICD-10-CM | POA: Diagnosis not present

## 2016-08-05 DIAGNOSIS — M5412 Radiculopathy, cervical region: Secondary | ICD-10-CM | POA: Diagnosis not present

## 2016-08-07 ENCOUNTER — Other Ambulatory Visit: Payer: Self-pay | Admitting: Internal Medicine

## 2016-08-28 DIAGNOSIS — M542 Cervicalgia: Secondary | ICD-10-CM | POA: Diagnosis not present

## 2016-08-28 DIAGNOSIS — R293 Abnormal posture: Secondary | ICD-10-CM | POA: Diagnosis not present

## 2016-08-29 DIAGNOSIS — J340 Abscess, furuncle and carbuncle of nose: Secondary | ICD-10-CM | POA: Diagnosis not present

## 2016-09-18 DIAGNOSIS — J340 Abscess, furuncle and carbuncle of nose: Secondary | ICD-10-CM | POA: Diagnosis not present

## 2016-09-22 ENCOUNTER — Telehealth: Payer: Self-pay | Admitting: Internal Medicine

## 2016-09-22 DIAGNOSIS — R5383 Other fatigue: Secondary | ICD-10-CM

## 2016-09-22 DIAGNOSIS — E034 Atrophy of thyroid (acquired): Secondary | ICD-10-CM

## 2016-09-22 DIAGNOSIS — R0789 Other chest pain: Secondary | ICD-10-CM

## 2016-09-23 ENCOUNTER — Other Ambulatory Visit (INDEPENDENT_AMBULATORY_CARE_PROVIDER_SITE_OTHER): Payer: Medicare Other

## 2016-09-23 ENCOUNTER — Ambulatory Visit (INDEPENDENT_AMBULATORY_CARE_PROVIDER_SITE_OTHER): Payer: Medicare Other

## 2016-09-23 ENCOUNTER — Encounter: Payer: Self-pay | Admitting: Internal Medicine

## 2016-09-23 ENCOUNTER — Telehealth: Payer: Self-pay | Admitting: Radiology

## 2016-09-23 ENCOUNTER — Ambulatory Visit (INDEPENDENT_AMBULATORY_CARE_PROVIDER_SITE_OTHER): Payer: Medicare Other | Admitting: Internal Medicine

## 2016-09-23 VITALS — BP 130/70 | HR 78 | Temp 97.7°F | Wt 150.6 lb

## 2016-09-23 DIAGNOSIS — R5383 Other fatigue: Secondary | ICD-10-CM

## 2016-09-23 DIAGNOSIS — R079 Chest pain, unspecified: Secondary | ICD-10-CM

## 2016-09-23 DIAGNOSIS — R0789 Other chest pain: Secondary | ICD-10-CM | POA: Diagnosis not present

## 2016-09-23 DIAGNOSIS — R110 Nausea: Secondary | ICD-10-CM

## 2016-09-23 DIAGNOSIS — E034 Atrophy of thyroid (acquired): Secondary | ICD-10-CM

## 2016-09-23 DIAGNOSIS — R002 Palpitations: Secondary | ICD-10-CM

## 2016-09-23 LAB — LIPID PANEL
CHOL/HDL RATIO: 4
Cholesterol: 240 mg/dL — ABNORMAL HIGH (ref 0–200)
HDL: 54.9 mg/dL (ref >39.00)
LDL CALC: 171 mg/dL — AB (ref 0–99)
NONHDL: 185.27
Triglycerides: 73 mg/dL (ref 0.0–149.0)
VLDL: 14.6 mg/dL (ref 0.0–40.0)

## 2016-09-23 LAB — COMPREHENSIVE METABOLIC PANEL
ALT: 20 U/L (ref 0–35)
AST: 19 U/L (ref 0–37)
Albumin: 4.8 g/dL (ref 3.5–5.2)
Alkaline Phosphatase: 66 U/L (ref 39–117)
BILIRUBIN TOTAL: 0.4 mg/dL (ref 0.2–1.2)
BUN: 12 mg/dL (ref 6–23)
CALCIUM: 9.5 mg/dL (ref 8.4–10.5)
CHLORIDE: 106 meq/L (ref 96–112)
CO2: 28 meq/L (ref 19–32)
Creatinine, Ser: 0.74 mg/dL (ref 0.40–1.20)
GFR: 81.71 mL/min (ref >60.00)
GLUCOSE: 95 mg/dL (ref 70–99)
Potassium: 3.5 mEq/L (ref 3.5–5.1)
SODIUM: 143 meq/L (ref 135–145)
Total Protein: 7.4 g/dL (ref 6.0–8.3)

## 2016-09-23 LAB — TSH: TSH: 2.49 u[IU]/mL (ref 0.35–4.50)

## 2016-09-23 LAB — CBC WITH DIFFERENTIAL/PLATELET
BASOS PCT: 0.6 % (ref 0.0–3.0)
Basophils Absolute: 0 10*3/uL (ref 0.0–0.1)
EOS ABS: 0 10*3/uL (ref 0.0–0.7)
EOS PCT: 1 % (ref 0.0–5.0)
HCT: 43.6 % (ref 36.0–46.0)
Hemoglobin: 14.7 g/dL (ref 12.0–15.0)
LYMPHS PCT: 23.1 % (ref 12.0–46.0)
Lymphs Abs: 0.8 10*3/uL (ref 0.7–4.0)
MCHC: 33.6 g/dL (ref 30.0–36.0)
MCV: 94.6 fl (ref 78.0–100.0)
Monocytes Absolute: 0.3 10*3/uL (ref 0.1–1.0)
Monocytes Relative: 8.8 % (ref 3.0–12.0)
NEUTROS ABS: 2.2 10*3/uL (ref 1.4–7.7)
NEUTROS PCT: 66.5 % (ref 43.0–77.0)
Platelets: 206 10*3/uL (ref 150.0–400.0)
RBC: 4.61 Mil/uL (ref 3.87–5.11)
RDW: 13.2 % (ref 11.5–15.5)
WBC: 3.4 10*3/uL — ABNORMAL LOW (ref 4.0–10.5)

## 2016-09-23 LAB — H. PYLORI ANTIBODY, IGG: H PYLORI IGG: NEGATIVE

## 2016-09-23 LAB — LIPASE: Lipase: 17 U/L (ref 11.0–59.0)

## 2016-09-23 LAB — TROPONIN I: TNIDX: 0.01 ug/l (ref 0.00–0.06)

## 2016-09-23 MED ORDER — PREDNISONE 10 MG PO TABS: ORAL_TABLET | ORAL | 0 refills | Status: DC

## 2016-09-23 MED ORDER — PANTOPRAZOLE SODIUM 40 MG PO TBEC: 40.0000 mg | DELAYED_RELEASE_TABLET | Freq: Every day | ORAL | 1 refills | Status: DC

## 2016-09-23 NOTE — Patient Instructions (Addendum)
Your labs and EGK are reassuring that you did not have a heart attack or pancreatitis It may have been a bad case of reflux complicated by a reaction to the fish Resume your protonix I have ordered an ultrasound of your gallbladder and a cardiology referral for stress testing      For your rash:  Prednisone taper for the next 6 days  You can use allegra, zyrtec ,  claritin or benadryl for the itching

## 2016-09-23 NOTE — Telephone Encounter (Signed)
Pt came in for labs and troponin was not ordered stat. Did you want this stat?

## 2016-09-23 NOTE — Telephone Encounter (Signed)
Pt

## 2016-09-23 NOTE — Progress Notes (Signed)
Subjective:  Patient ID: Samantha Hanna, female    DOB: 01-11-1941  Age: 76 y.o. MRN: [id number]  CC: The primary encounter diagnosis was Palpitations. Diagnoses of Postprandial nausea, Chest pain of uncertain etiology, and Chest pain at rest were also pertinent to this visit.  HPI Samantha Hanna presents for evaluation of recent episode of chest pain .  Occurred on Friday night after going to bed,  Head eaten fish at at [employer] . Has a shellfish allergy that would result in nausea and vomiting if she eats shrimp and scallops.  Last Friday evening she woke up in the middle of the night with uncontrolled burping that lasted 45 minutes accompanied by feeling hot in the shoulders and burning  in the chest.  Did not feel good the next day, anorexia but not nauseated. Had loose stools for one day. No fever, no body aches,   Last dose of protonix was a week ago. Ran out.  Has been taking it for a year. Has not been using any PPI in the interim.  No FH of early CAD no prior caridac workup  Had a sinus infection a couple of weeks ago, was treated by chap with topical antibiotic      Outpatient Medications Prior to Visit  Medication Sig Dispense Refill  . ALPRAZolam (XANAX) 0.25 MG tablet Take 1 tablet (0.25 mg total) by mouth 2 (two) times daily as needed for anxiety or sleep. 30 tablet 4  . cholecalciferol (VITAMIN D) 1000 UNITS tablet Take 1,000 Units by mouth daily.      Marland Kitchen co-enzyme Q-10 30 MG capsule Take 30 mg by mouth 3 (three) times daily.      . cyanocobalamin (,VITAMIN B-12,) 1000 MCG/ML injection Inject 1 mL (1,000 mcg total) into the muscle every 14 (fourteen) days. For daily/weekly/monthly use per MD directions for B12 deficiency 10 mL 11  . escitalopram (LEXAPRO) 10 MG tablet Take 1 tablet (10 mg total) by mouth daily. 90 tablet 1  . fish oil-omega-3 fatty acids 1000 MG capsule Take 2 g by mouth daily.      . fluticasone (FLONASE) 50 MCG/ACT nasal spray     . FOLIC ACID PO  Take by mouth.      . meloxicam (MOBIC) 15 MG tablet Take 1 tablet (15 mg total) by mouth daily. As needed for joint pain 90 tablet 1  . mirtazapine (REMERON) 7.5 MG tablet Take 1 tablet (7.5 mg total) by mouth at bedtime. 90 tablet 1  . Probiotic Product (PROBIOTIC DAILY PO) Take 1 tablet by mouth daily.    Marland Kitchen SYNTHROID 75 MCG tablet TAKE 1 TABLET EVERY DAY ON AN EMPTY STOMACH WITH A GLASS OF WATER AT LEAST 30-60 MINUTES BEFORE BREAKFAST 90 tablet 2  . Syringe, Disposable, 1 ML MISC For daily/weekly/monthly B12 injections 25 each 2  . traMADol (ULTRAM) 50 MG tablet Take 1 tablet (50 mg total) by mouth every 8 (eight) hours as needed. 90 tablet 0  . pantoprazole (PROTONIX) 40 MG tablet Take 1 tablet (40 mg total) by mouth daily. 90 tablet 1  . predniSONE (DELTASONE) 10 MG tablet 6 tablets on Day 1 , then reduce by 1 tablet daily until gone 21 tablet 0   No facility-administered medications prior to visit.     Review of Systems;  Patient denies headache, fevers, malaise, unintentional weight loss, skin rash, eye pain, sinus congestion and sinus pain, sore throat, dysphagia,  hemoptysis , cough, dyspnea, wheezing, chest pain,  palpitations, orthopnea, edema, abdominal pain, nausea, melena, diarrhea, constipation, flank pain, dysuria, hematuria, urinary  Frequency, nocturia, numbness, tingling, seizures,  Focal weakness, Loss of consciousness,  Tremor, insomnia, depression, anxiety, and suicidal ideation.      Objective:  BP 130/70   Pulse 78   Temp 97.7 F (36.5 C) (Oral)   Wt 150 lb 9.6 oz (68.3 kg)   SpO2 96%   BMI 23.59 kg/m   BP Readings from Last 3 Encounters:  09/24/16 132/80  09/23/16 130/70  03/21/16 136/78    Wt Readings from Last 3 Encounters:  09/24/16 149 lb 12 oz (67.9 kg)  09/23/16 150 lb 9.6 oz (68.3 kg)  03/21/16 147 lb (66.7 kg)    General appearance: alert, cooperative and appears stated age Ears: normal TM's and external ear canals both ears Throat: lips,  mucosa, and tongue normal; teeth and gums normal Neck: no adenopathy, no carotid bruit, supple, symmetrical, trachea midline and thyroid not enlarged, symmetric, no tenderness/mass/nodules Back: symmetric, no curvature. ROM normal. No CVA tenderness. Lungs: clear to auscultation bilaterally Heart: regular rate and rhythm, S1, S2 normal, no murmur, click, rub or gallop Abdomen: soft, non-tender; bowel sounds normal; no masses,  no organomegaly Pulses: 2+ and symmetric Skin: Skin color, texture, turgor normal. No rashes or lesions Lymph nodes: Cervical, supraclavicular, and axillary nodes normal.  No results found for: HGBA1C  Lab Results  Component Value Date   CREATININE 0.74 09/23/2016   CREATININE 0.72 12/15/2015   CREATININE 0.86 04/17/2015    Lab Results  Component Value Date   WBC 3.4 (L) 09/23/2016   HGB 14.7 09/23/2016   HCT 43.6 09/23/2016   PLT 206.0 09/23/2016   GLUCOSE 95 09/23/2016   CHOL 240 (H) 09/23/2016   TRIG 73.0 09/23/2016   HDL 54.90 09/23/2016   LDLDIRECT 172.6 09/08/2013   LDLCALC 171 (H) 09/23/2016   ALT 20 09/23/2016   AST 19 09/23/2016   NA 143 09/23/2016   K 3.5 09/23/2016   CL 106 09/23/2016   CREATININE 0.74 09/23/2016   BUN 12 09/23/2016   CO2 28 09/23/2016   TSH 2.49 09/23/2016    US Abdomen Complete  Result Date: 05/15/2015 CLINICAL DATA:  Night sweats.  Vasomotor flushing. EXAM: ULTRASOUND ABDOMEN COMPLETE COMPARISON:  None. FINDINGS: Gallbladder: No gallstones or wall thickening visualized. No sonographic Murphy sign noted. Common bile duct: Diameter: 4 mm, within normal limits. Liver: No focal lesion identified. Within normal limits in parenchymal echogenicity. IVC: No abnormality visualized. Pancreas: Visualized portion unremarkable. Spleen: Size and appearance within normal limits. Right Kidney: Length: 10.3 cm. Echogenicity within normal limits. No mass or hydronephrosis visualized. Left Kidney: Length: 11.4 cm. Echogenicity within  normal limits. No mass or hydronephrosis visualized. Several small parapelvic renal cysts are seen. Abdominal aorta: No aneurysm visualized. Other findings: None. IMPRESSION: No evidence of gallstones, biliary dilatation, or other acute findings. Small left renal parapelvic cysts incidentally noted. No evidence of hydronephrosis. Electronically Signed   By: Earle Gell M.D.   On: 05/15/2015 11:04    Assessment & Plan:   Problem List Items Addressed This Visit    Chest pain at rest    Cardiac enzymes, ekg, chest x ray, lipase normal. Etiology is most likely GERD but her husband the retired Dr  Deon Pilling is very concerned. Referral to cardiology for evaluation  As she has had no prior cardiology evaluation, and ultrasound to evaluate gallbladder       Other Visit Diagnoses    Palpitations    -  Primary   Relevant Orders   EKG 12-Lead (Completed)   Postprandial nausea       Relevant Orders   US Abdomen Limited RUQ   Chest pain of uncertain etiology       Relevant Orders   Ambulatory referral to Cardiology     A total of 40 minutes was spent with patient more than half of which was spent in counseling patient on the above mentioned issues , reviewing and explaining recent labs and imaging studies done, and coordination of care.  I am having Ms. Seawright maintain her fish oil-omega-3 fatty acids, cholecalciferol, co-enzyme 0000000, FOLIC ACID PO, Probiotic Product (PROBIOTIC DAILY PO), fluticasone, Syringe (Disposable), escitalopram, meloxicam, cyanocobalamin, ALPRAZolam, mirtazapine, traMADol, SYNTHROID, predniSONE, and pantoprazole.  Meds ordered this encounter  Medications  . predniSONE (DELTASONE) 10 MG tablet    Sig: 6 tablets on Day 1 , then reduce by 1 tablet daily until gone    Dispense:  21 tablet    Refill:  0  . pantoprazole (PROTONIX) 40 MG tablet    Sig: Take 1 tablet (40 mg total) by mouth daily.    Dispense:  90 tablet    Refill:  1    Medications Discontinued During This  Encounter  Medication Reason  . predniSONE (DELTASONE) 10 MG tablet Reorder  . pantoprazole (PROTONIX) 40 MG tablet Reorder    Follow-up: No Follow-up on file.   Crecencio Mc, MD

## 2016-09-24 ENCOUNTER — Encounter: Payer: Self-pay | Admitting: Cardiology

## 2016-09-24 ENCOUNTER — Ambulatory Visit (INDEPENDENT_AMBULATORY_CARE_PROVIDER_SITE_OTHER): Payer: Medicare Other | Admitting: Cardiology

## 2016-09-24 VITALS — BP 132/80 | HR 73 | Ht 68.0 in | Wt 149.8 lb

## 2016-09-24 DIAGNOSIS — R0602 Shortness of breath: Secondary | ICD-10-CM | POA: Diagnosis not present

## 2016-09-24 DIAGNOSIS — R079 Chest pain, unspecified: Secondary | ICD-10-CM | POA: Insufficient documentation

## 2016-09-24 DIAGNOSIS — E782 Mixed hyperlipidemia: Secondary | ICD-10-CM

## 2016-09-24 LAB — CK TOTAL AND CKMB (NOT AT ARMC)
CK, MB: 2 ng/mL (ref 0.0–5.0)
RELATIVE INDEX: 2.1 (ref 0.0–4.0)
Total CK: 96 U/L (ref 7–177)

## 2016-09-24 NOTE — Progress Notes (Signed)
Cardiology Office Note   Date:  09/25/2016   ID:  Samantha Hanna, DOB 01/15/1941, MRN [id number]  Referring Doctor:  Crecencio Mc, MD   Cardiologist:   Wende Bushy, MD   Reason for consultation:  Chief Complaint  Patient presents with  . OTHER    . Meds reviewed verbally with pt.      History of Present Illness: Samantha Hanna is a 76 y.o. female who presents for evaluation of chest discomfort/shortness of breath  The chest discomfort along with other symptoms happened right after eating fish/halibut at a restaurant. She believes that she has some kind of allergic or intolerance reaction to seafood. She has had fish before but she was thinking that maybe the fish was cooked along with other seafood. Right after eating, she noticed discomfort in the chest with some shortness of breath, feeling warm all over, associated with burping and a rash around the upper back shoulder area.this episode was quite intense, more intense than her previous allergic reaction to seafood. It also lasted longer than previous episode. She also had some nausea and it took longer for her to completely recover from this episode.  She had been walking for exercise previously but less so in the last 6 months due to her own laziness, those were patient's own words. She does not have any symptoms of chest pain or shortness of breath with exerti. No palpitations dizziness or lightheadedness. No loss of consciou.   ROS:  Please see the history of present illness. Aside from mentioned under HPI, all other systems are reviewed and negative.     Past Medical History:  Diagnosis Date  . Hyperlipidemia   . Thyroid disease     Past Surgical History:  Procedure Laterality Date  . APPENDECTOMY    . MELANOMA EXCISION     x 2, Dr. Sharlett Iles     reports that she has never smoked. She has never used smokeless tobacco. She reports that she drinks about 1.5 oz of alcohol per week . She reports that she does not use  drugs.   family history includes Arthritis (age of onset: 10) in her daughter; Coronary artery disease (age of onset: 30) in her father; Healthy in her daughter; Heart Problems in her father; Osteoporosis in her mother.   Outpatient Medications Prior to Visit  Medication Sig Dispense Refill  . ALPRAZolam (XANAX) 0.25 MG tablet Take 1 tablet (0.25 mg total) by mouth 2 (two) times daily as needed for anxiety or sleep. 30 tablet 4  . cholecalciferol (VITAMIN D) 1000 UNITS tablet Take 1,000 Units by mouth daily.      Marland Kitchen co-enzyme Q-10 30 MG capsule Take 30 mg by mouth 3 (three) times daily.      . cyanocobalamin (,VITAMIN B-12,) 1000 MCG/ML injection Inject 1 mL (1,000 mcg total) into the muscle every 14 (fourteen) days. For daily/weekly/monthly use per MD directions for B12 deficiency 10 mL 11  . escitalopram (LEXAPRO) 10 MG tablet Take 1 tablet (10 mg total) by mouth daily. 90 tablet 1  . fish oil-omega-3 fatty acids 1000 MG capsule Take 2 g by mouth daily.      . fluticasone (FLONASE) 50 MCG/ACT nasal spray     . FOLIC ACID PO Take by mouth.      . meloxicam (MOBIC) 15 MG tablet Take 1 tablet (15 mg total) by mouth daily. As needed for joint pain 90 tablet 1  . mirtazapine (REMERON) 7.5 MG tablet Take  1 tablet (7.5 mg total) by mouth at bedtime. 90 tablet 1  . pantoprazole (PROTONIX) 40 MG tablet Take 1 tablet (40 mg total) by mouth daily. 90 tablet 1  . Probiotic Product (PROBIOTIC DAILY PO) Take 1 tablet by mouth daily.    Marland Kitchen SYNTHROID 75 MCG tablet TAKE 1 TABLET EVERY DAY ON AN EMPTY STOMACH WITH A GLASS OF WATER AT LEAST 30-60 MINUTES BEFORE BREAKFAST 90 tablet 2  . Syringe, Disposable, 1 ML MISC For daily/weekly/monthly B12 injections 25 each 2  . traMADol (ULTRAM) 50 MG tablet Take 1 tablet (50 mg total) by mouth every 8 (eight) hours as needed. 90 tablet 0  . predniSONE (DELTASONE) 10 MG tablet 6 tablets on Day 1 , then reduce by 1 tablet daily until gone (Patient not taking: Reported on  09/24/2016) 21 tablet 0   No facility-administered medications prior to visit.      Allergies: Shellfish allergy; Statins; and Penicillins    PHYSICAL EXAM: VS:  BP 132/80 (BP Location: Right Arm, Patient Position: Sitting, Cuff Size: Normal)   Pulse 73   Ht 5\' 8"  (1.727 m)   Wt 149 lb 12 oz (67.9 kg)   BMI 22.77 kg/m  , Body mass index is 22.77 kg/m. Wt Readings from Last 3 Encounters:  09/24/16 149 lb 12 oz (67.9 kg)  09/23/16 150 lb 9.6 oz (68.3 kg)  03/21/16 147 lb (66.7 kg)    GENERAL:  well developed, well nourished, not in acute distress HEENT: normocephalic, pink conjunctivae, anicteric sclerae, no xanthelasma, normal dentition, oropharynx clear NECK:  no neck vein engorgement, JVP normal, no hepatojugular reflux, carotid upstroke brisk and symmetric, no bruit, no thyromegaly, no lymphadenopathy LUNGS:  good respiratory effort, clear to auscultation bilaterally CV:  PMI not displaced, no thrills, no lifts, S1 and S2 within normal limits, no palpable S3 or S4, no murmurs, no rubs, no gallops ABD:  Soft, nontender, nondistended, normoactive bowel sounds, no abdominal aortic bruit, no hepatomegaly, no splenomegaly MS: nontender back, no kyphosis, no scoliosis, no joint deformities EXT:  2+ DP/PT pulses, no edema, no varicosities, no cyanosis, no clubbing SKIN: warm, nondiaphoretic, normal turgor, no ulcers NEUROPSYCH: alert, oriented to person, place, and time, sensory/motor grossly intact, normal mood, appropriate affect  Recent Labs: 09/23/2016: ALT 20; BUN 12; Creatinine, Ser 0.74; Hemoglobin 14.7; Platelets 206.0; Potassium 3.5; Sodium 143; TSH 2.49   Lipid Panel    Component Value Date/Time   CHOL 240 (H) 09/23/2016 1036   TRIG 73.0 09/23/2016 1036   HDL 54.90 09/23/2016 1036   CHOLHDL 4 09/23/2016 1036   VLDL 14.6 09/23/2016 1036   LDLCALC 171 (H) 09/23/2016 1036   LDLDIRECT 172.6 09/08/2013 0843     Other studies Reviewed:  EKG:  The ekg from 09/23/2016 was  personally reviewed by me and it revealed sinus rhythm, 61 BPM, nonspecific ST-T wave changes are as R in V1.  Additional studies/ records that were reviewed personally reviewed by me today include: none available   ASSESSMENT AND PLAN: Episode occurring right after eating fish, with chest discomfort and shortness breath, nausea. Most likely related to intolerance to the food or something that is GI related Would recommend proceeding with stress echocardiogram to rule out possibility of any cardiac involvement due to her age.Check echocardiogram   Hyperlipidemia LDL is quite high. Statin is listed as one of her allergies. Not clear exactly which statin she has tried.Would recommend to do retrial with milder ones, pravastatin or simvastatin. Can start at lower  doses. May utilize coQ10 if with mm pains.Will defer to PCP.  Current medicines are reviewed at length with the patient today.  The patient does not have concerns regarding medicines.  Labs/ tests ordered today include:  Orders Placed This Encounter  Procedures  . ECHOCARDIOGRAM COMPLETE  . ECHOCARDIOGRAM STRESS TEST    I had a lengthy and detailed discussion with the patient regarding diagnoses, prognosis, diagnostic options.  I counseled the patient on importance of lifestyle modification including heart healthy diet, regular physical activity.   Disposition:   FU with undersigned after tests   Thank you for this consultation. We will forwarding this consultation to referring physician.   Signed, Wende Bushy, MD  09/25/2016 9:42 AM    [place]  This note was generated in part with voice recognition software and I apologize for any typographical errors that were not detected and corrected.

## 2016-09-24 NOTE — Assessment & Plan Note (Signed)
Cardiac enzymes, ekg, chest x ray, lipase normal. Etiology is most likely GERD but her husband the retired Dr  Deon Pilling is very concerned. Referral to cardiology for evaluation  As she has had no prior cardiology evaluation, and ultrasound to evaluate gallbladder

## 2016-09-24 NOTE — Patient Instructions (Addendum)
Testing/Procedures: Your physician has requested that you have an echocardiogram. Echocardiography is a painless test that uses sound waves to create images of your heart. It provides your doctor with information about the size and shape of your heart and how well your heart's chambers and valves are working. This procedure takes approximately one hour. There are no restrictions for this procedure.  Your physician has requested that you have a stress echocardiogram. For further information please visit [email or web address]. Please follow instruction sheet as given.   Do not drink or eat foods with caffeine for 24 hours before the test. (Chocolate, coffee, tea, or energy drinks)  If you use an inhaler, bring it with you to the test.  Do not smoke for 4 hours before the test.  Wear comfortable shoes and clothing.  Follow-Up: Your physician recommends that you schedule a follow-up appointment as needed with Dr. Yvone Neu. We will call you with your results and schedule follow up at that time if needed.   It was a pleasure seeing you today here in the office. Please do not hesitate to give [place] a call back if you have any further questions. [place], BSN   Echocardiogram An echocardiogram, or echocardiography, uses sound waves (ultrasound) to produce an image of your heart. The echocardiogram is simple, painless, obtained within a short period of time, and offers valuable information to your health care provider. The images from an echocardiogram can provide information such as:  Evidence of coronary artery disease (CAD).  Heart size.  Heart muscle function.  Heart valve function.  Aneurysm detection.  Evidence of a past heart attack.  Fluid buildup around the heart.  Heart muscle thickening.  Assess heart valve function. Tell a health care provider about:  Any allergies you have.  All medicines you are taking, including vitamins, herbs, eye drops, creams, and  over-the-counter medicines.  Any problems you or family members have had with anesthetic medicines.  Any blood disorders you have.  Any surgeries you have had.  Any medical conditions you have.  Whether you are pregnant or may be pregnant. What happens before the procedure? No special preparation is needed. Eat and drink normally. What happens during the procedure?  In order to produce an image of your heart, gel will be applied to your chest and a wand-like tool (transducer) will be moved over your chest. The gel will help transmit the sound waves from the transducer. The sound waves will harmlessly bounce off your heart to allow the heart images to be captured in real-time motion. These images will then be recorded.  You may need an IV to receive a medicine that improves the quality of the pictures. What happens after the procedure? You may return to your normal schedule including diet, activities, and medicines, unless your health care provider tells you otherwise. This information is not intended to replace advice given to you by your health care provider. Make sure you discuss any questions you have with your health care provider. Document Released: 08/09/2000 Document Revised: 03/30/2016 Document Reviewed: 04/19/2013 Elsevier Interactive Patient Education  2017 [employer].  Exercise Stress Echocardiogram An exercise stress echocardiogram is a test to check how well your heart is working. This test uses sound waves (ultrasound) and a computer to make images of your heart before and after exercise. Ultrasound images that are taken before you exercise (your resting echocardiogram) will show how much blood is getting to your heart muscle and how well your heart muscle and  heart valves are functioning. During the next part of this test, you will walk on a treadmill or ride a stationary bike to see how exercise affects your heart. While you exercise, the electrical activity of your  heart will be monitored with an electrocardiogram (ECG). Your blood pressure will also be monitored. You may have this test if you:  Have chest pain or other symptoms of a heart problem.  Recently had a heart attack or heart surgery.  Have heart valve problems.  Have a condition that causes narrowing of the blood vessels that supply your heart (coronary artery disease).  Have a high risk of heart disease and are starting a new exercise program.  Have a high risk of heart disease and need to have major surgery. Tell a health care provider about:  Any allergies you have.  All medicines you are taking, including vitamins, herbs, eye drops, creams, and over-the-counter medicines.  Any problems you or family members have had with anesthetic medicines.  Any blood disorders you have.  Any surgeries you have had.  Any medical conditions you have.  Whether you are pregnant or may be pregnant. What are the risks? Generally, this is a safe procedure. However, problems may occur, including:  Chest pain.  Dizziness or light-headedness.  Shortness of breath.  Increased or irregular heartbeat (palpitations).  Nausea or vomiting.  Heart attack (very rare). What happens before the procedure?  Follow instructions from your health care provider about eating or drinking restrictions. You may be asked to avoid all forms of caffeine for 24 hours before your procedure, or as told by your health care provider.  Ask your health care provider about changing or stopping your regular medicines. This is especially important if you are taking diabetes medicines or blood thinners.  If you use an inhaler, bring it with you to the test.  Wear loose, comfortable clothing and walking shoes.  Do notuse any products that contain nicotine or tobacco, such as cigarettes and e-cigarettes, for 4 hours before the test or as told by your health care provider. If you need help quitting, ask your health  care provider. What happens during the procedure?  You will take off your clothes from the waist up and put on a hospital gown.  A technician will place electrodes on your chest.  A blood pressure cuff will be placed on your arm.  You will lie down on a table for an ultrasound exam before you exercise. Gel will be rubbed on your chest, and a handheld device (transducer) will be pressed against your chest and moved over your heart.  Then, you will start exercising by walking on a treadmill or pedaling a stationary bicycle.  Your blood pressure and heart rhythm will be monitored while you exercise.  The exercise will gradually get harder or faster.  You will exercise until:  Your heart reaches a target level.  You are too tired to continue.  You cannot continue because of chest pain, weakness, or dizziness.  You will have another ultrasound exam after you stop exercising. The procedure may vary among health care providers and hospitals. What happens after the procedure?  Your heart rate and blood pressure will be monitored until they return to your normal levels. Summary  An exercise stress echocardiogram is a test that uses ultrasound to check how well your heart works before and after exercise.  Before the test, follow instructions from your health care provider about stopping medications, avoiding nicotine and tobacco, and  avoiding certain foods and drinks.  During the test, your blood pressure and heart rhythm will be monitored while you exercise on a treadmill or stationary bicycle. This information is not intended to replace advice given to you by your health care provider. Make sure you discuss any questions you have with your health care provider. Document Released: 08/16/2004 Document Revised: 04/03/2016 Document Reviewed: 04/03/2016 Elsevier Interactive Patient Education  2017 [employer].

## 2016-09-27 ENCOUNTER — Ambulatory Visit
Admission: RE | Admit: 2016-09-27 | Discharge: 2016-09-27 | Disposition: A | Discharge status: Home / Self Care | Payer: Medicare Other | Source: Ambulatory Visit | Attending: Internal Medicine | Admitting: Internal Medicine

## 2016-09-27 DIAGNOSIS — R110 Nausea: Secondary | ICD-10-CM | POA: Diagnosis not present

## 2016-09-29 ENCOUNTER — Encounter: Payer: Self-pay | Admitting: Internal Medicine

## 2016-10-18 ENCOUNTER — Other Ambulatory Visit: Payer: Self-pay

## 2016-10-18 ENCOUNTER — Ambulatory Visit (INDEPENDENT_AMBULATORY_CARE_PROVIDER_SITE_OTHER): Payer: Medicare Other

## 2016-10-18 DIAGNOSIS — R0602 Shortness of breath: Secondary | ICD-10-CM | POA: Diagnosis not present

## 2016-10-18 LAB — ECHOCARDIOGRAM STRESS TEST
CSEPEDS: 51 s
CSEPEW: 8.2 METS
CSEPPHR: 146 {beats}/min
Exercise duration (min): 6 min
MPHR: 147 {beats}/min
Percent HR: 99 %
Rest HR: 78 {beats}/min

## 2016-12-13 DIAGNOSIS — H0015 Chalazion left lower eyelid: Secondary | ICD-10-CM | POA: Diagnosis not present

## 2016-12-23 DIAGNOSIS — H353131 Nonexudative age-related macular degeneration, bilateral, early dry stage: Secondary | ICD-10-CM | POA: Diagnosis not present

## 2016-12-23 DIAGNOSIS — H2513 Age-related nuclear cataract, bilateral: Secondary | ICD-10-CM | POA: Diagnosis not present

## 2017-01-07 DIAGNOSIS — S93409A Sprain of unspecified ligament of unspecified ankle, initial encounter: Secondary | ICD-10-CM | POA: Insufficient documentation

## 2017-01-07 DIAGNOSIS — S93401A Sprain of unspecified ligament of right ankle, initial encounter: Secondary | ICD-10-CM | POA: Diagnosis not present

## 2017-01-14 DIAGNOSIS — H0015 Chalazion left lower eyelid: Secondary | ICD-10-CM | POA: Diagnosis not present

## 2017-02-01 ENCOUNTER — Other Ambulatory Visit: Payer: Self-pay | Admitting: Internal Medicine

## 2017-02-01 MED ORDER — PREDNISONE 10 MG PO TABS: ORAL_TABLET | ORAL | 0 refills | Status: DC

## 2017-02-03 NOTE — Telephone Encounter (Signed)
Please advise, Last OV was 09/23/2016 Last Refill was 1/29, please advise, thanks

## 2017-02-03 NOTE — Telephone Encounter (Signed)
This was called in over the weekend by me.  Please Confirm with pharmacy that they filled it on Saturday?   thanks

## 2017-02-04 NOTE — Telephone Encounter (Signed)
Confirmed with the pharmacy that she filled it on Saturday 6/9, thanks

## 2017-02-28 DIAGNOSIS — M7542 Impingement syndrome of left shoulder: Secondary | ICD-10-CM

## 2017-02-28 DIAGNOSIS — M1812 Unilateral primary osteoarthritis of first carpometacarpal joint, left hand: Secondary | ICD-10-CM | POA: Diagnosis not present

## 2017-02-28 DIAGNOSIS — M19049 Primary osteoarthritis, unspecified hand: Secondary | ICD-10-CM

## 2017-02-28 HISTORY — DX: Primary osteoarthritis, unspecified hand: M19.049

## 2017-02-28 HISTORY — DX: Impingement syndrome of left shoulder: M75.42

## 2017-03-13 DIAGNOSIS — Z85828 Personal history of other malignant neoplasm of skin: Secondary | ICD-10-CM | POA: Diagnosis not present

## 2017-03-13 DIAGNOSIS — Z872 Personal history of diseases of the skin and subcutaneous tissue: Secondary | ICD-10-CM | POA: Diagnosis not present

## 2017-03-13 DIAGNOSIS — Z85820 Personal history of malignant melanoma of skin: Secondary | ICD-10-CM | POA: Diagnosis not present

## 2017-03-13 DIAGNOSIS — L821 Other seborrheic keratosis: Secondary | ICD-10-CM | POA: Diagnosis not present

## 2017-03-21 ENCOUNTER — Ambulatory Visit (INDEPENDENT_AMBULATORY_CARE_PROVIDER_SITE_OTHER): Payer: Medicare Other

## 2017-03-21 VITALS — BP 130/70 | HR 67 | Temp 98.3°F | Resp 14 | Ht 66.75 in | Wt 148.0 lb

## 2017-03-21 DIAGNOSIS — Z1211 Encounter for screening for malignant neoplasm of colon: Secondary | ICD-10-CM | POA: Diagnosis not present

## 2017-03-21 DIAGNOSIS — Z0000 Encounter for general adult medical examination without abnormal findings: Secondary | ICD-10-CM | POA: Diagnosis not present

## 2017-03-21 NOTE — Progress Notes (Signed)
Care was provided under my supervision. I agree with the management as indicated in the note.  Khalifa Knecht DO  

## 2017-03-21 NOTE — Patient Instructions (Addendum)
Samantha Hanna , Thank you for taking time to come for your Medicare Wellness Visit. I appreciate your ongoing commitment to your health goals. Please review the following plan we discussed and let me know if I can assist you in the future.   Follow up with Dr. Derrel Nip as needed.    Bring a copy of your [place] and/or Living Will to be scanned into chart.  Colonoscopy ordered, follow as directed.\  Have a great day!  These are the goals we discussed: Goals    . Increase physical activity          Stay active  Walk and swim for exercise 3 days weekly, 30 minutes     . Increase water intake          Stay hydrated and drink plenty of fluids.  Increase water intake by 2 cups daily.       This is a list of the screening recommended for you and due dates:  Health Maintenance  Topic Date Due  . Colon Cancer Screening  08/26/2014  . Flu Shot  03/26/2017  . Mammogram  01/15/2018  . Tetanus Vaccine  07/14/2022  . DEXA scan (bone density measurement)  Completed  . Pneumonia vaccines  Completed      Colonoscopy, Adult A colonoscopy is an exam to look at the entire large intestine. During the exam, a lubricated, bendable tube is inserted into the anus and then passed into the rectum, colon, and other parts of the large intestine. A colonoscopy is often done as a part of normal colorectal screening or in response to certain symptoms, such as anemia, persistent diarrhea, abdominal pain, and blood in the stool. The exam can help screen for and diagnose medical problems, including:  Tumors.  Polyps.  Inflammation.  Areas of bleeding.  Tell a health care provider about:  Any allergies you have.  All medicines you are taking, including vitamins, herbs, eye drops, creams, and over-the-counter medicines.  Any problems you or family members have had with anesthetic medicines.  Any blood disorders you have.  Any surgeries you have had.  Any medical conditions  you have.  Any problems you have had passing stool. What are the risks? Generally, this is a safe procedure. However, problems may occur, including:  Bleeding.  A tear in the intestine.  A reaction to medicines given during the exam.  Infection (rare).  What happens before the procedure? Eating and drinking restrictions Follow instructions from your health care provider about eating and drinking, which may include:  A few days before the procedure - follow a low-fiber diet. Avoid nuts, seeds, dried fruit, raw fruits, and vegetables.  1-3 days before the procedure - follow a clear liquid diet. Drink only clear liquids, such as clear broth or bouillon, black coffee or tea, clear juice, clear soft drinks or sports drinks, gelatin dessert, and popsicles. Avoid any liquids that contain red or purple dye.  On the day of the procedure - do not eat or drink anything during the 2 hours before the procedure, or within the time period that your health care provider recommends.  Bowel prep If you were prescribed an oral bowel prep to clean out your colon:  Take it as told by your health care provider. Starting the day before your procedure, you will need to drink a large amount of medicated liquid. The liquid will cause you to have multiple loose stools until your stool is almost clear or light  green.  If your skin or anus gets irritated from diarrhea, you may use these to relieve the irritation: ? Medicated wipes, such as adult wet wipes with aloe and vitamin E. ? A skin soothing-product like petroleum jelly.  If you vomit while drinking the bowel prep, take a break for up to 60 minutes and then begin the bowel prep again. If vomiting continues and you cannot take the bowel prep without vomiting, call your health care provider.  General instructions  Ask your health care provider about changing or stopping your regular medicines. This is especially important if you are taking diabetes  medicines or blood thinners.  Plan to have someone take you home from the hospital or clinic. What happens during the procedure?  An IV tube may be inserted into one of your veins.  You will be given medicine to help you relax (sedative).  To reduce your risk of infection: ? Your health care team will wash or sanitize their hands. ? Your anal area will be washed with soap.  You will be asked to lie on your side with your knees bent.  Your health care provider will lubricate a long, thin, flexible tube. The tube will have a camera and a light on the end.  The tube will be inserted into your anus.  The tube will be gently eased through your rectum and colon.  Air will be delivered into your colon to keep it open. You may feel some pressure or cramping.  The camera will be used to take images during the procedure.  A small tissue sample may be removed from your body to be examined under a microscope (biopsy). If any potential problems are found, the tissue will be sent to a lab for testing.  If small polyps are found, your health care provider may remove them and have them checked for cancer cells.  The tube that was inserted into your anus will be slowly removed. The procedure may vary among health care providers and hospitals. What happens after the procedure?  Your blood pressure, heart rate, breathing rate, and blood oxygen level will be monitored until the medicines you were given have worn off.  Do not drive for 24 hours after the exam.  You may have a small amount of blood in your stool.  You may pass gas and have mild abdominal cramping or bloating due to the air that was used to inflate your colon during the exam.  It is up to you to get the results of your procedure. Ask your health care provider, or the department performing the procedure, when your results will be ready. This information is not intended to replace advice given to you by your health care provider.  Make sure you discuss any questions you have with your health care provider. Document Released: 08/09/2000 Document Revised: 06/12/2016 Document Reviewed: 10/24/2015 Elsevier Interactive Patient Education  2018 [employer].

## 2017-03-21 NOTE — Progress Notes (Signed)
Subjective:   LARAMIE GELLES is a 76 y.o. female who presents for Medicare Annual (Subsequent) preventive examination.  Review of Systems:  No ROS.  Medicare Wellness Visit. Additional risk factors are reflected in the social history.  Cardiac Risk Factors include: advanced age (>56men, >73 women)     Objective:     Vitals: BP 130/70 (BP Location: Left Arm, Patient Position: Sitting, Cuff Size: Normal)   Pulse 67   Temp 98.3 F (36.8 C) (Oral)   Resp 14   Ht 5' 6.75" (1.695 m)   Wt 148 lb (67.1 kg)   SpO2 97%   BMI 23.35 kg/m   Body mass index is 23.35 kg/m.   Tobacco History  Smoking Status  . Never Smoker  Smokeless Tobacco  . Never Used     Counseling given: Not Answered   Past Medical History:  Diagnosis Date  . Hyperlipidemia   . Thyroid disease    Past Surgical History:  Procedure Laterality Date  . APPENDECTOMY    . MELANOMA EXCISION     x 2, Dr. Sharlett Iles   Family History  Problem Relation Age of Onset  . Osteoporosis Mother        Deceased, 54  . Coronary artery disease Father 72       Deceased, 15 in MVA  . Heart Problems Father   . Arthritis Daughter 26       Rheumatiod  . Healthy Daughter    History  Sexual Activity  . Sexual activity: Yes    Outpatient Encounter Prescriptions as of 03/21/2017  Medication Sig  . cholecalciferol (VITAMIN D) 1000 UNITS tablet Take 1,000 Units by mouth daily.    Marland Kitchen co-enzyme Q-10 30 MG capsule Take 30 mg by mouth 3 (three) times daily.    . cyanocobalamin (,VITAMIN B-12,) 1000 MCG/ML injection Inject 1 mL (1,000 mcg total) into the muscle every 14 (fourteen) days. For daily/weekly/monthly use per MD directions for B12 deficiency  . escitalopram (LEXAPRO) 10 MG tablet Take 1 tablet (10 mg total) by mouth daily.  . fish oil-omega-3 fatty acids 1000 MG capsule Take 2 g by mouth daily.    . fluticasone (FLONASE) 50 MCG/ACT nasal spray   . FOLIC ACID PO Take by mouth.    . meloxicam (MOBIC) 15 MG tablet Take  1 tablet (15 mg total) by mouth daily. As needed for joint pain  . mirtazapine (REMERON) 7.5 MG tablet Take 1 tablet (7.5 mg total) by mouth at bedtime.  . pantoprazole (PROTONIX) 40 MG tablet Take 1 tablet (40 mg total) by mouth daily.  Marland Kitchen SYNTHROID 75 MCG tablet TAKE 1 TABLET EVERY DAY ON AN EMPTY STOMACH WITH A GLASS OF WATER AT LEAST 30-60 MINUTES BEFORE BREAKFAST  . Syringe, Disposable, 1 ML MISC For daily/weekly/monthly B12 injections  . traMADol (ULTRAM) 50 MG tablet Take 1 tablet (50 mg total) by mouth every 8 (eight) hours as needed.  . ALPRAZolam (XANAX) 0.25 MG tablet Take 1 tablet (0.25 mg total) by mouth 2 (two) times daily as needed for anxiety or sleep. (Patient not taking: Reported on 03/21/2017)  . Probiotic Product (PROBIOTIC DAILY PO) Take 1 tablet by mouth daily.  . [DISCONTINUED] predniSONE (DELTASONE) 10 MG tablet TAKE 6 TABLETS ON DAY 1, THEN REDUCE BY 1 TABLET DAILY UNTIL GONE.  . [DISCONTINUED] predniSONE (DELTASONE) 10 MG tablet 6 tablets on Day 1 , then reduce by 1 tablet daily until gone   No facility-administered encounter medications on file as  of 03/21/2017.     Activities of Daily Living In your present state of health, do you have any difficulty performing the following activities: 03/21/2017 03/21/2016  Hearing? N Y  Vision? N N  Difficulty concentrating or making decisions? N N  Walking or climbing stairs? N N  Dressing or bathing? N N  Doing errands, shopping? N N  Preparing Food and eating ? N N  Using the Toilet? N N  In the past six months, have you accidently leaked urine? N N  Do you have problems with loss of bowel control? N N  Managing your Medications? N N  Managing your Finances? N N  Housekeeping or managing your Housekeeping? N N  Some recent data might be hidden    Patient Care Team: Crecencio Mc, MD as PCP - General (Internal Medicine)    Assessment:    This is a routine wellness examination for [facility]. The goal of the wellness visit  is to assist the patient how to close the gaps in care and create a preventative care plan for the patient.   The roster of all physicians providing medical care to patient is listed in the Snapshot section of the chart.  Taking calcium VIT D as appropriate/Osteoporosis risk reviewed.    Safety issues reviewed; Smoke and carbon monoxide detectors in the home. No firearms in the home.  Wears seatbelts when driving or riding with others. Patient does wear sunscreen or protective clothing when in direct sunlight. No violence in the home.  Patient is alert, normal appearance, oriented to person/place/and time.  Correctly identified the president of the [place], recall of 3/3 words, and performing simple calculations. Displays appropriate judgement and can read correct time from watch face.   No new identified risk were noted.  No failures at ADL's or IADL's.    BMI- discussed the importance of a healthy diet, water intake and the benefits of aerobic exercise. Educational material provided.   24 hour diet recall: Breakfast: Cereal Lunch: Pimento cheese sandwich, apple Dinner: Chicken and vegetable casserole   Daily fluid intake: 0 cups of caffeine, 4 cups of water  Dental- every 6 months.  Dr. Ceasar Mons.  Eye- Visual acuity not assessed per patient preference since they have regular follow up with the ophthalmologist.  Wears corrective lenses.  Sleep patterns- Sleeps 8 hours at night.  Wakes feeling rested.  Naps as needed.  Colonoscopy ordered; follow as directed.  Educational material provided.  Patient Concerns: None at this time. Follow up with PCP as needed.  Exercise Activities and Dietary recommendations Current Exercise Habits: Home exercise routine, Type of exercise: calisthenics;walking, Time (Minutes): 30, Frequency (Times/Week): 1, Weekly Exercise (Minutes/Week): 30, Intensity: Mild  Goals    . Increase physical activity          Stay active  Walk and swim for  exercise 3 days weekly, 30 minutes     . Increase water intake          Stay hydrated and drink plenty of fluids.  Increase water intake by 2 cups daily.      Fall Risk Fall Risk  03/21/2017 03/21/2016  Falls in the past year? Yes No  Number falls in past yr: 1 -  Injury with Fall? Yes -  Follow up Falls prevention discussed;Education provided -   Depression Screen PHQ 2/9 Scores 03/21/2017 03/21/2016 07/15/2012  PHQ - 2 Score 0 0 0     Cognitive Function MMSE - Mini Mental State Exam  03/21/2017 03/21/2016  Orientation to time 5 5  Orientation to Place 5 5  Registration 3 3  Attention/ Calculation 5 5  Recall 3 3  Language- name 2 objects 2 2  Language- repeat 1 1  Language- follow 3 step command 3 3  Language- read & follow direction 1 1  Write a sentence 1 1  Copy design 1 1  Total score 30 30        Immunization History  Administered Date(s) Administered  . Influenza Split 06/05/2011, 05/26/2012, 06/24/2013, 06/09/2014  . Influenza-Unspecified 05/17/2015, 05/16/2016  . PPD Test 04/17/2015  . Pneumococcal Conjugate-13 06/16/2014  . Pneumococcal Polysaccharide-23 07/11/2011  . Tdap 07/14/2012  . Zoster 07/16/2010   Screening Tests Health Maintenance  Topic Date Due  . COLONOSCOPY  08/26/2014  . INFLUENZA VACCINE  03/26/2017  . MAMMOGRAM  01/15/2018  . TETANUS/TDAP  07/14/2022  . DEXA SCAN  Completed  . PNA vac Low Risk Adult  Completed      Plan:    End of life planning; Advance aging; Advanced directives discussed. Copy of current HCPOA/Living Will requested.    I have personally reviewed and noted the following in the patient's chart:   . Medical and social history . Use of alcohol, tobacco or illicit drugs  . Current medications and supplements . Functional ability and status . Nutritional status . Physical activity . Advanced directives . List of other physicians . Hospitalizations, surgeries, and ER visits in previous 12  months . Vitals . Screenings to include cognitive, depression, and falls . Referrals and appointments  In addition, I have reviewed and discussed with patient certain preventive protocols, quality metrics, and best practice recommendations. A written personalized care plan for preventive services as well as general preventive health recommendations were provided to patient.     Varney Biles, LPN  [id number]

## 2017-04-07 ENCOUNTER — Telehealth: Payer: Self-pay

## 2017-04-07 ENCOUNTER — Other Ambulatory Visit: Payer: Self-pay

## 2017-04-07 DIAGNOSIS — M50321 Other cervical disc degeneration at C4-C5 level: Secondary | ICD-10-CM | POA: Insufficient documentation

## 2017-04-07 DIAGNOSIS — Z1211 Encounter for screening for malignant neoplasm of colon: Secondary | ICD-10-CM

## 2017-04-07 HISTORY — DX: Other cervical disc degeneration at C4-C5 level: M50.321

## 2017-04-07 NOTE — Telephone Encounter (Signed)
Gastroenterology Pre-Procedure Review  Request Date: 8/23 Requesting Physician: Dr. Marius Ditch  PATIENT REVIEW QUESTIONS: The patient responded to the following health history questions as indicated:    *No major illness at this time.  1. Are you having any GI issues? no 2. Do you have a personal history of Polyps? no 3. Do you have a family history of Colon Cancer or Polyps? no 4. Diabetes Mellitus? no 5. Joint replacements in the past 12 months?no 6. Major health problems in the past 3 months?no 7. Any artificial heart valves, MVP, or defibrillator?no    MEDICATIONS & ALLERGIES:    Patient reports the following regarding taking any anticoagulation/antiplatelet therapy:   Plavix, Coumadin, Eliquis, Xarelto, Lovenox, Pradaxa, Brilinta, or Effient? no Aspirin? no  Patient confirms/reports the following medications:  Current Outpatient Prescriptions  Medication Sig Dispense Refill  . ALPRAZolam (XANAX) 0.25 MG tablet Take 1 tablet (0.25 mg total) by mouth 2 (two) times daily as needed for anxiety or sleep. (Patient not taking: Reported on 03/21/2017) 30 tablet 4  . cholecalciferol (VITAMIN D) 1000 UNITS tablet Take 1,000 Units by mouth daily.      Marland Kitchen co-enzyme Q-10 30 MG capsule Take 30 mg by mouth 3 (three) times daily.      . cyanocobalamin (,VITAMIN B-12,) 1000 MCG/ML injection Inject 1 mL (1,000 mcg total) into the muscle every 14 (fourteen) days. For daily/weekly/monthly use per MD directions for B12 deficiency 10 mL 11  . escitalopram (LEXAPRO) 10 MG tablet Take 1 tablet (10 mg total) by mouth daily. 90 tablet 1  . fish oil-omega-3 fatty acids 1000 MG capsule Take 2 g by mouth daily.      . fluticasone (FLONASE) 50 MCG/ACT nasal spray     . FOLIC ACID PO Take by mouth.      . meloxicam (MOBIC) 15 MG tablet Take 1 tablet (15 mg total) by mouth daily. As needed for joint pain 90 tablet 1  . mirtazapine (REMERON) 7.5 MG tablet Take 1 tablet (7.5 mg total) by mouth at bedtime. 90 tablet 1   . pantoprazole (PROTONIX) 40 MG tablet Take 1 tablet (40 mg total) by mouth daily. 90 tablet 1  . Probiotic Product (PROBIOTIC DAILY PO) Take 1 tablet by mouth daily.    Marland Kitchen SYNTHROID 75 MCG tablet TAKE 1 TABLET EVERY DAY ON AN EMPTY STOMACH WITH A GLASS OF WATER AT LEAST 30-60 MINUTES BEFORE BREAKFAST 90 tablet 2  . Syringe, Disposable, 1 ML MISC For daily/weekly/monthly B12 injections 25 each 2  . traMADol (ULTRAM) 50 MG tablet Take 1 tablet (50 mg total) by mouth every 8 (eight) hours as needed. 90 tablet 0   No current facility-administered medications for this visit.     Patient confirms/reports the following allergies:  Allergies  Allergen Reactions  . Shellfish Allergy   . Statins   . Penicillins Rash    No orders of the defined types were placed in this encounter.   AUTHORIZATION INFORMATION Primary Insurance: 1D#: Group #:  Secondary Insurance: 1D#: Group #:  SCHEDULE INFORMATION: Date: 04/17/17 Time: Location: [place]

## 2017-04-09 ENCOUNTER — Telehealth: Payer: Self-pay

## 2017-04-09 NOTE — Telephone Encounter (Signed)
Pt contacted Endo this morning to cancel her colonoscopy scheduled 04/17/17 she has to coordinate with her husbands appointments and schedule.  She will contact us to reschedule.

## 2017-04-14 ENCOUNTER — Ambulatory Visit (INDEPENDENT_AMBULATORY_CARE_PROVIDER_SITE_OTHER): Payer: Medicare Other | Admitting: Internal Medicine

## 2017-04-14 ENCOUNTER — Encounter: Payer: Self-pay | Admitting: Internal Medicine

## 2017-04-14 VITALS — BP 126/78 | HR 78 | Temp 98.0°F | Resp 15 | Ht 66.75 in | Wt 144.6 lb

## 2017-04-14 DIAGNOSIS — E785 Hyperlipidemia, unspecified: Secondary | ICD-10-CM

## 2017-04-14 DIAGNOSIS — N951 Menopausal and female climacteric states: Secondary | ICD-10-CM | POA: Diagnosis not present

## 2017-04-14 DIAGNOSIS — E538 Deficiency of other specified B group vitamins: Secondary | ICD-10-CM | POA: Diagnosis not present

## 2017-04-14 DIAGNOSIS — E034 Atrophy of thyroid (acquired): Secondary | ICD-10-CM | POA: Diagnosis not present

## 2017-04-14 DIAGNOSIS — R5381 Other malaise: Secondary | ICD-10-CM | POA: Diagnosis not present

## 2017-04-14 DIAGNOSIS — E559 Vitamin D deficiency, unspecified: Secondary | ICD-10-CM | POA: Diagnosis not present

## 2017-04-14 DIAGNOSIS — R5383 Other fatigue: Secondary | ICD-10-CM

## 2017-04-14 DIAGNOSIS — D72819 Decreased white blood cell count, unspecified: Secondary | ICD-10-CM | POA: Diagnosis not present

## 2017-04-14 DIAGNOSIS — Z1211 Encounter for screening for malignant neoplasm of colon: Secondary | ICD-10-CM

## 2017-04-14 DIAGNOSIS — Z23 Encounter for immunization: Secondary | ICD-10-CM | POA: Diagnosis not present

## 2017-04-14 DIAGNOSIS — Z1239 Encounter for other screening for malignant neoplasm of breast: Secondary | ICD-10-CM

## 2017-04-14 DIAGNOSIS — Z79899 Other long term (current) drug therapy: Secondary | ICD-10-CM | POA: Diagnosis not present

## 2017-04-14 DIAGNOSIS — Z1231 Encounter for screening mammogram for malignant neoplasm of breast: Secondary | ICD-10-CM | POA: Diagnosis not present

## 2017-04-14 DIAGNOSIS — Z0000 Encounter for general adult medical examination without abnormal findings: Secondary | ICD-10-CM

## 2017-04-14 DIAGNOSIS — Z780 Asymptomatic menopausal state: Secondary | ICD-10-CM | POA: Diagnosis not present

## 2017-04-14 LAB — COMPREHENSIVE METABOLIC PANEL
ALBUMIN: 4.4 g/dL (ref 3.5–5.2)
ALT: 14 U/L (ref 0–35)
AST: 15 U/L (ref 0–37)
Alkaline Phosphatase: 58 U/L (ref 39–117)
BUN: 15 mg/dL (ref 6–23)
CHLORIDE: 106 meq/L (ref 96–112)
CO2: 28 mEq/L (ref 19–32)
Calcium: 9.6 mg/dL (ref 8.4–10.5)
Creatinine, Ser: 0.75 mg/dL (ref 0.40–1.20)
GFR: 80.33 mL/min (ref >60.00)
GLUCOSE: 98 mg/dL (ref 70–99)
POTASSIUM: 4.3 meq/L (ref 3.5–5.1)
SODIUM: 143 meq/L (ref 135–145)
Total Bilirubin: 0.8 mg/dL (ref 0.2–1.2)
Total Protein: 7.4 g/dL (ref 6.0–8.3)

## 2017-04-14 LAB — CBC WITH DIFFERENTIAL/PLATELET
BASOS PCT: 0.6 % (ref 0.0–3.0)
Basophils Absolute: 0 10*3/uL (ref 0.0–0.1)
EOS PCT: 0.5 % (ref 0.0–5.0)
Eosinophils Absolute: 0 10*3/uL (ref 0.0–0.7)
HEMATOCRIT: 44.1 % (ref 36.0–46.0)
Hemoglobin: 14.6 g/dL (ref 12.0–15.0)
LYMPHS PCT: 23.7 % (ref 12.0–46.0)
Lymphs Abs: 1 10*3/uL (ref 0.7–4.0)
MCHC: 33.2 g/dL (ref 30.0–36.0)
MCV: 97 fl (ref 78.0–100.0)
Monocytes Absolute: 0.3 10*3/uL (ref 0.1–1.0)
Monocytes Relative: 8.2 % (ref 3.0–12.0)
NEUTROS ABS: 2.8 10*3/uL (ref 1.4–7.7)
Neutrophils Relative %: 67 % (ref 43.0–77.0)
PLATELETS: 196 10*3/uL (ref 150.0–400.0)
RBC: 4.54 Mil/uL (ref 3.87–5.11)
RDW: 13.4 % (ref 11.5–15.5)
WBC: 4.2 10*3/uL (ref 4.0–10.5)

## 2017-04-14 LAB — VITAMIN D 25 HYDROXY (VIT D DEFICIENCY, FRACTURES): VITD: 31.15 ng/mL (ref 30.00–100.00)

## 2017-04-14 LAB — LIPID PANEL
CHOLESTEROL: 263 mg/dL — AB (ref 0–200)
HDL: 59.1 mg/dL (ref >39.00)
LDL CALC: 185 mg/dL — AB (ref 0–99)
NonHDL: 203.54
Total CHOL/HDL Ratio: 4
Triglycerides: 93 mg/dL (ref 0.0–149.0)
VLDL: 18.6 mg/dL (ref 0.0–40.0)

## 2017-04-14 LAB — TSH: TSH: 0.92 u[IU]/mL (ref 0.35–4.50)

## 2017-04-14 LAB — VITAMIN B12: Vitamin B-12: 397 pg/mL (ref 211–911)

## 2017-04-14 MED ORDER — ZOSTER VAC RECOMB ADJUVANTED 50 MCG/0.5ML IM SUSR: 0.5000 mL | Freq: Once | INTRAMUSCULAR | 1 refills | Status: AC

## 2017-04-14 MED ORDER — MELOXICAM 7.5 MG PO TABS: 7.5000 mg | ORAL_TABLET | Freq: Every day | ORAL | 1 refills | Status: DC

## 2017-04-14 NOTE — Patient Instructions (Signed)
I recommend that you use your pool to exercise your joints and build your endurance back up    Health Maintenance for Postmenopausal Women Menopause is a normal process in which your reproductive ability comes to an end. This process happens gradually over a span of months to years, usually between the ages of 48 and 55. Menopause is complete when you have missed 12 consecutive menstrual periods. It is important to talk with your health care provider about some of the most common conditions that affect postmenopausal women, such as heart disease, cancer, and bone loss (osteoporosis). Adopting a healthy lifestyle and getting preventive care can help to promote your health and wellness. Those actions can also lower your chances of developing some of these common conditions. What should I know about menopause? During menopause, you may experience a number of symptoms, such as:  Moderate-to-severe hot flashes.  Night sweats.  Decrease in sex drive.  Mood swings.  Headaches.  Tiredness.  Irritability.  Memory problems.  Insomnia.  Choosing to treat or not to treat menopausal changes is an individual decision that you make with your health care provider. What should I know about hormone replacement therapy and supplements? Hormone therapy products are effective for treating symptoms that are associated with menopause, such as hot flashes and night sweats. Hormone replacement carries certain risks, especially as you become older. If you are thinking about using estrogen or estrogen with progestin treatments, discuss the benefits and risks with your health care provider. What should I know about heart disease and stroke? Heart disease, heart attack, and stroke become more likely as you age. This may be due, in part, to the hormonal changes that your body experiences during menopause. These can affect how your body processes dietary fats, triglycerides, and cholesterol. Heart attack and stroke  are both medical emergencies. There are many things that you can do to help prevent heart disease and stroke:  Have your blood pressure checked at least every 1-2 years. High blood pressure causes heart disease and increases the risk of stroke.  If you are 55-79 years old, ask your health care provider if you should take aspirin to prevent a heart attack or a stroke.  Do not use any tobacco products, including cigarettes, chewing tobacco, or electronic cigarettes. If you need help quitting, ask your health care provider.  It is important to eat a healthy diet and maintain a healthy weight. ? Be sure to include plenty of vegetables, fruits, low-fat dairy products, and lean protein. ? Avoid eating foods that are high in solid fats, added sugars, or salt (sodium).  Get regular exercise. This is one of the most important things that you can do for your health. ? Try to exercise for at least 150 minutes each week. The type of exercise that you do should increase your heart rate and make you sweat. This is known as moderate-intensity exercise. ? Try to do strengthening exercises at least twice each week. Do these in addition to the moderate-intensity exercise.  Know your numbers.Ask your health care provider to check your cholesterol and your blood glucose. Continue to have your blood tested as directed by your health care provider.  What should I know about cancer screening? There are several types of cancer. Take the following steps to reduce your risk and to catch any cancer development as early as possible. Breast Cancer  Practice breast self-awareness. ? This means understanding how your breasts normally appear and feel. ? It also means doing regular   breast self-exams. Let your health care provider know about any changes, no matter how small.  If you are 40 or older, have a clinician do a breast exam (clinical breast exam or CBE) every year. Depending on your age, family history, and  medical history, it may be recommended that you also have a yearly breast X-ray (mammogram).  If you have a family history of breast cancer, talk with your health care provider about genetic screening.  If you are at high risk for breast cancer, talk with your health care provider about having an MRI and a mammogram every year.  Breast cancer (BRCA) gene test is recommended for women who have family members with BRCA-related cancers. Results of the assessment will determine the need for genetic counseling and BRCA1 and for BRCA2 testing. BRCA-related cancers include these types: ? Breast. This occurs in males or females. ? Ovarian. ? Tubal. This may also be called fallopian tube cancer. ? Cancer of the abdominal or pelvic lining (peritoneal cancer). ? Prostate. ? Pancreatic.  Cervical, Uterine, and Ovarian Cancer Your health care provider may recommend that you be screened regularly for cancer of the pelvic organs. These include your ovaries, uterus, and vagina. This screening involves a pelvic exam, which includes checking for microscopic changes to the surface of your cervix (Pap test).  For women ages 21-65, health care providers may recommend a pelvic exam and a Pap test every three years. For women ages 30-65, they may recommend the Pap test and pelvic exam, combined with testing for human papilloma virus (HPV), every five years. Some types of HPV increase your risk of cervical cancer. Testing for HPV may also be done on women of any age who have unclear Pap test results.  Other health care providers may not recommend any screening for nonpregnant women who are considered low risk for pelvic cancer and have no symptoms. Ask your health care provider if a screening pelvic exam is right for you.  If you have had past treatment for cervical cancer or a condition that could lead to cancer, you need Pap tests and screening for cancer for at least 20 years after your treatment. If Pap tests have  been discontinued for you, your risk factors (such as having a new sexual partner) need to be reassessed to determine if you should start having screenings again. Some women have medical problems that increase the chance of getting cervical cancer. In these cases, your health care provider may recommend that you have screening and Pap tests more often.  If you have a family history of uterine cancer or ovarian cancer, talk with your health care provider about genetic screening.  If you have vaginal bleeding after reaching menopause, tell your health care provider.  There are currently no reliable tests available to screen for ovarian cancer.  Lung Cancer Lung cancer screening is recommended for adults 55-80 years old who are at high risk for lung cancer because of a history of smoking. A yearly low-dose CT scan of the lungs is recommended if you:  Currently smoke.  Have a history of at least 30 pack-years of smoking and you currently smoke or have quit within the past 15 years. A pack-year is smoking an average of one pack of cigarettes per day for one year.  Yearly screening should:  Continue until it has been 15 years since you quit.  Stop if you develop a health problem that would prevent you from having lung cancer treatment.  Colorectal Cancer    This type of cancer can be detected and can often be prevented.  Routine colorectal cancer screening usually begins at age 22 and continues through age 67.  If you have risk factors for colon cancer, your health care provider may recommend that you be screened at an earlier age.  If you have a family history of colorectal cancer, talk with your health care provider about genetic screening.  Your health care provider may also recommend using home test kits to check for hidden blood in your stool.  A small camera at the end of a tube can be used to examine your colon directly (sigmoidoscopy or colonoscopy). This is done to check for the  earliest forms of colorectal cancer.  Direct examination of the colon should be repeated every 5-10 years until age 50. However, if early forms of precancerous polyps or small growths are found or if you have a family history or genetic risk for colorectal cancer, you may need to be screened more often.  Skin Cancer  Check your skin from head to toe regularly.  Monitor any moles. Be sure to tell your health care provider: ? About any new moles or changes in moles, especially if there is a change in a mole's shape or color. ? If you have a mole that is larger than the size of a pencil eraser.  If any of your family members has a history of skin cancer, especially at a young age, talk with your health care provider about genetic screening.  Always use sunscreen. Apply sunscreen liberally and repeatedly throughout the day.  Whenever you are outside, protect yourself by wearing long sleeves, pants, a wide-brimmed hat, and sunglasses.  What should I know about osteoporosis? Osteoporosis is a condition in which bone destruction happens more quickly than new bone creation. After menopause, you may be at an increased risk for osteoporosis. To help prevent osteoporosis or the bone fractures that can happen because of osteoporosis, the following is recommended:  If you are 61-60 years old, get at least 1,000 mg of calcium and at least 600 mg of vitamin D per day.  If you are older than age 40 but younger than age 73, get at least 1,200 mg of calcium and at least 600 mg of vitamin D per day.  If you are older than age 68, get at least 1,200 mg of calcium and at least 800 mg of vitamin D per day.  Smoking and excessive alcohol intake increase the risk of osteoporosis. Eat foods that are rich in calcium and vitamin D, and do weight-bearing exercises several times each week as directed by your health care provider. What should I know about how menopause affects my mental health? Depression may occur  at any age, but it is more common as you become older. Common symptoms of depression include:  Low or sad mood.  Changes in sleep patterns.  Changes in appetite or eating patterns.  Feeling an overall lack of motivation or enjoyment of activities that you previously enjoyed.  Frequent crying spells.  Talk with your health care provider if you think that you are experiencing depression. What should I know about immunizations? It is important that you get and maintain your immunizations. These include:  Tetanus, diphtheria, and pertussis (Tdap) booster vaccine.  Influenza every year before the flu season begins.  Pneumonia vaccine.  Shingles vaccine.  Your health care provider may also recommend other immunizations. This information is not intended to replace advice given to you by  your health care provider. Make sure you discuss any questions you have with your health care provider. Document Released: 10/04/2005 Document Revised: 03/01/2016 Document Reviewed: 05/16/2015 Elsevier Interactive Patient Education  2018 [employer].

## 2017-04-14 NOTE — Assessment & Plan Note (Signed)
Annual comprehensive preventive exam was done as well as an evaluation and management of chronic conditions .  During the course of the visit the patient was educated and counseled about appropriate screening and preventive services including :  diabetes screening, lipid analysis with projected  10 year  risk for CAD , nutrition counseling, breast, cervical and colorectal cancer screening, and recommended immunizations.  Printed recommendations for health maintenance screenings was give 

## 2017-04-14 NOTE — Progress Notes (Signed)
Patient ID: Samantha Samantha Hanna Samantha Hanna, female    DOB: 08-06-1941  Age: 76 y.o. MRN: [id number]  The patient is here for annual preventive exam as well as follow up and  management of other chronic and acute problems.  OVERDUE FOR COLONOSCOPY. WAS SCHEDULED AND CANCELLED  DUE TO [name]'S SCHEDULE OF DOCTOR VISITS . Last one 2006,  discussed cologuard  MAMMOGRAM MAY 2017   The risk factors are reflected in the social history.  The roster of all physicians providing medical care to patient - is listed in the Snapshot section of the chart.  Activities of daily living:  The patient is 100% independent in all ADLs: dressing, toileting, feeding as well as independent mobility  Home safety : The patient has smoke detectors in the home. They wear seatbelts.  There are no firearms at home. There is no violence in the home.   There is no risks for hepatitis, STDs or HIV. There is no   history of blood transfusion. They have no travel history to infectious disease endemic areas of the world.  The patient has seen their dentist in the last six month. They have seen their eye doctor in the last year. They admit to slight hearing difficulty with regard to whispered voices and some television programs.  They have deferred audiologic testing in the last year.  They do not  have excessive sun exposure. Discussed the need for sun protection: hats, long sleeves and use of sunscreen if there is significant sun exposure.   Diet: the importance of a healthy diet is discussed. They do have a healthy diet.  The benefits of regular aerobic exercise were discussed. She walks 4 times per week ,  20 minutes.   Depression screen: there are no signs or vegative symptoms of depression- irritability, change in appetite, anhedonia, sadness/tearfullness.  Cognitive assessment: the patient manages all their financial and personal affairs and is actively engaged. They could relate day,date,year and events; recalled 2/3 objects at 3 minutes;  performed clock-face test normally.  The following portions of the patient's history were reviewed and updated as appropriate: allergies, current medications, past family history, past medical history,  past surgical history, past social history  and problem list.  Visual acuity was not assessed per patient preference since she has regular follow up with her ophthalmologist. Hearing and body mass index were assessed and reviewed.   During the course of the visit the patient was educated and counseled about appropriate screening and preventive services including : fall prevention , diabetes screening, nutrition counseling, colorectal cancer screening, and recommended immunizations.    CC: The primary encounter diagnosis was Postmenopausal estrogen deficiency. Diagnoses of Breast cancer screening, Hypothyroidism due to acquired atrophy of thyroid, B12 deficiency, Long-term use of high-risk medication, Vitamin D deficiency, Leukopenia, unspecified type, Hyperlipidemia LDL goal <130, Routine general medical examination at a health care facility, Need for 23-polyvalent pneumococcal polysaccharide vaccine, Caregiver with fatigue, Symptoms, such as flushing, sleeplessness, headache, lack of concentration, associated with the menopause, Malaise and fatigue, and Screening for colon cancer were also pertinent to this visit. Has been seeing  Samantha Samantha Hanna Samantha Hanna FOR LEFT SHOULDER IMPINGEMENT AND LEFT THUMB ARTHRITIS.    TAKING meloxicam 7.5  And fish oil. On protonix   "just doesn't feel good." Fractured 3 bones in right foot on Mother's Day descending 3 steps at hte back of house.  Wore a boot for four  weeks.  Still aches with prolonged standing DEXA SCAN ORDERED BUT NOT DONE  Using the  swimming pool for exercise since she fractured her foot.  Sleeping ok but shoulder is sore in the morning but improves with heat,  ice and exercise. Energy level not what it used to be.  Not walking as much due to foot fracture    History Samantha Samantha Hanna Samantha Hanna has a past medical history of Hyperlipidemia and Thyroid disease.   She has a past surgical history that includes Melanoma excision and Appendectomy.   Her family history includes Arthritis (age of onset: 38) in her daughter; Coronary artery disease (age of onset: 98) in her father; Healthy in her daughter; Heart Problems in her father; Osteoporosis in her mother.She reports that she has never smoked. She has never used smokeless tobacco. She reports that she drinks about 1.5 oz of alcohol per week . She reports that she does not use drugs.  Outpatient Medications Prior to Visit  Medication Sig Dispense Refill  . ALPRAZolam (XANAX) 0.25 MG tablet alprazolam 0.25 mg tablet    . cholecalciferol (VITAMIN D) 1000 UNITS tablet Take 1,000 Units by mouth daily.      Marland Kitchen co-enzyme Q-10 30 MG capsule Take 30 mg by mouth 3 (three) times daily.      . cyanocobalamin (,VITAMIN B-12,) 1000 MCG/ML injection Inject 1 mL (1,000 mcg total) into the muscle every 14 (fourteen) days. For daily/weekly/monthly use per MD directions for B12 deficiency 10 mL 11  . fish oil-omega-3 fatty acids 1000 MG capsule Take 2 g by mouth daily.      . fluticasone (FLONASE) 50 MCG/ACT nasal spray     . FOLIC ACID PO Take by mouth.      . mirtazapine (REMERON) 7.5 MG tablet Take 1 tablet (7.5 mg total) by mouth at bedtime. 90 tablet 1  . pantoprazole (PROTONIX) 40 MG tablet Take 1 tablet (40 mg total) by mouth daily. 90 tablet 1  . Probiotic Product (PROBIOTIC DAILY PO) Take 1 tablet by mouth daily.    Marland Kitchen SYNTHROID 75 MCG tablet TAKE 1 TABLET EVERY DAY ON AN EMPTY STOMACH WITH A GLASS OF WATER AT LEAST 30-60 MINUTES BEFORE BREAKFAST 90 tablet 2  . Syringe, Disposable, 1 ML MISC For daily/weekly/monthly B12 injections 25 each 2  . meloxicam (MOBIC) 15 MG tablet Take 1 tablet (15 mg total) by mouth daily. As needed for joint pain 90 tablet 1  . escitalopram (LEXAPRO) 10 MG tablet escitalopram 10 mg tablet    .  methocarbamol (ROBAXIN) 500 MG tablet methocarbamol 500 mg tablet    . nortriptyline (PAMELOR) 10 MG capsule nortriptyline 10 mg capsule    . cephALEXin (KEFLEX) 500 MG capsule cephalexin 500 mg capsule    . HYDROcodone-acetaminophen (NORCO/VICODIN) 5-325 MG tablet hydrocodone 5 mg-acetaminophen 325 mg tablet    . levofloxacin (LEVAQUIN) 500 MG tablet levofloxacin 500 mg tablet    . montelukast (SINGULAIR) 10 MG tablet montelukast 10 mg tablet    . traMADol (ULTRAM) 50 MG tablet Take 1 tablet (50 mg total) by mouth every 8 (eight) hours as needed. (Patient not taking: Reported on 04/14/2017) 90 tablet 0   No facility-administered medications prior to visit.     Review of Systems   Patient denies headache, fevers, malaise, unintentional weight loss, skin rash, eye pain, sinus congestion and sinus pain, sore throat, dysphagia,  hemoptysis , cough, dyspnea, wheezing, chest pain, palpitations, orthopnea, edema, abdominal pain, nausea, melena, diarrhea, constipation, flank pain, dysuria, hematuria, urinary  Frequency, nocturia, numbness, tingling, seizures,  Focal weakness, Loss of consciousness,  Tremor, insomnia, depression,  anxiety, and suicidal ideation.     Objective:  BP 126/78 (BP Location: Left Arm, Patient Position: Sitting, Cuff Size: Normal)   Pulse 78   Temp 98 F (36.7 C) (Oral)   Resp 15   Ht 5' 6.75" (1.695 m)   Wt 144 lb 9.6 oz (65.6 kg)   SpO2 96%   BMI 22.82 kg/m   Physical Exam   General appearance: alert, cooperative and appears stated age Ears: normal TM's and external ear canals both ears Throat: lips, mucosa, and tongue normal; teeth and gums normal Neck: no adenopathy, no carotid bruit, supple, symmetrical, trachea midline and thyroid not enlarged, symmetric, no tenderness/mass/nodules Back: symmetric, no curvature. ROM normal. No CVA tenderness. Lungs: clear to auscultation bilaterally Heart: regular rate and rhythm, S1, S2 normal, no murmur, click, rub or  gallop Abdomen: soft, non-tender; bowel sounds normal; no masses,  no organomegaly Pulses: 2+ and symmetric Skin: Skin color, texture, turgor normal. No rashes or lesions Lymph nodes: Cervical, supraclavicular, and axillary nodes normal.,    Assessment & Plan:   Problem List Items Addressed This Visit    B12 deficiency   Relevant Orders   Vitamin B12 (Completed)   Caregiver with fatigue    She maintains a steady level of anxiety, fatigue, mild malaise that is aggravated by her husband's struggle with Parkinson's disease .  No changes to medications today.      Hypothyroidism    Thyroid function is WNL on current dose.  No current changes needed.   Lab Results  Component Value Date   TSH 0.92 04/14/2017         Relevant Orders   TSH (Completed)   Malaise and fatigue    Aggravated by deconditioning. Urged to use her swimming pool daily for endurance building       Routine general medical examination at a health care facility    Annual comprehensive preventive exam was done as well as an evaluation and management of chronic conditions .  During the course of the visit the patient was educated and counseled about appropriate screening and preventive services including :  diabetes screening, lipid analysis with projected  10 year  risk for CAD , nutrition counseling, breast, cervical and colorectal cancer screening, and recommended immunizations.  Printed recommendations for health maintenance screenings was give      Screening for colon cancer    Discussed use of cologaurd since she is at average risk      Symptoms, such as flushing, sleeplessness, headache, lack of concentration, associated with the menopause    All hae improved with lexapro .  No changes to regimen today.        Other Visit Diagnoses    Postmenopausal estrogen deficiency    -  Primary   Relevant Orders   DG Bone Density   Breast cancer screening       Relevant Orders   MM DIGITAL SCREENING BILATERAL    Long-term use of high-risk medication       Relevant Orders   Comprehensive metabolic panel (Completed)   Vitamin D deficiency       Relevant Orders   VITAMIN D 25 Hydroxy (Vit-D Deficiency, Fractures) (Completed)   Leukopenia, unspecified type       Relevant Orders   CBC with Differential/Platelet (Completed)   Hyperlipidemia LDL goal <130       Relevant Orders   Lipid panel (Completed)   Need for 23-polyvalent pneumococcal polysaccharide vaccine  Relevant Orders   Pneumococcal polysaccharide vaccine 23-valent greater than or equal to 2yo subcutaneous/IM (Completed)      I have discontinued Ms. Fayette's traMADol, cephALEXin, HYDROcodone-acetaminophen, levofloxacin, and montelukast. I have also changed her meloxicam. Additionally, I am having her start on Zoster Vac Recomb Adjuvanted. Lastly, I am having her maintain her fish oil-omega-3 fatty acids, cholecalciferol, co-enzyme O-27, FOLIC ACID PO, Probiotic Product (PROBIOTIC DAILY PO), fluticasone, Syringe (Disposable), cyanocobalamin, mirtazapine, SYNTHROID, pantoprazole, methocarbamol, nortriptyline, ALPRAZolam, and escitalopram.  Meds ordered this encounter  Medications  . meloxicam (MOBIC) 7.5 MG tablet    Sig: Take 1 tablet (7.5 mg total) by mouth daily. As needed for joint pain    Dispense:  90 tablet    Refill:  1  . Zoster Vac Recomb Adjuvanted [facility]) injection    Sig: Inject 0.5 mLs into the muscle once.    Dispense:  1 each    Refill:  1    Medications Discontinued During This Encounter  Medication Reason  . cephALEXin (KEFLEX) 500 MG capsule Patient has not taken in last 30 days  . levofloxacin (LEVAQUIN) 500 MG tablet Patient has not taken in last 30 days  . HYDROcodone-acetaminophen (NORCO/VICODIN) 5-325 MG tablet Patient has not taken in last 30 days  . montelukast (SINGULAIR) 10 MG tablet Patient has not taken in last 30 days  . traMADol (ULTRAM) 50 MG tablet Patient has not taken in last 30 days  .  meloxicam (MOBIC) 15 MG tablet Reorder    Follow-up: No Follow-up on file.   Crecencio Mc, MD

## 2017-04-15 NOTE — Assessment & Plan Note (Signed)
She maintains a steady level of anxiety, fatigue, mild malaise that is aggravated by her husband's struggle with Parkinson's disease .  No changes to medications today.

## 2017-04-15 NOTE — Assessment & Plan Note (Signed)
" >>  ASSESSMENT AND PLAN FOR VASCULAR DEMENTIA (HCC) WRITTEN ON 04/15/2017  9:02 PM BY Inge Waldroup L, MD  Aggravated by deconditioning. Urged to use her swimming pool daily for endurance building  "

## 2017-04-15 NOTE — Assessment & Plan Note (Signed)
Aggravated by deconditioning. Urged to use her swimming pool daily for endurance building

## 2017-04-15 NOTE — Assessment & Plan Note (Signed)
All hae improved with lexapro .  No changes to regimen today.

## 2017-04-15 NOTE — Assessment & Plan Note (Signed)
Thyroid function is WNL on current dose.  No current changes needed.   Lab Results  Component Value Date   TSH 0.92 04/14/2017

## 2017-04-15 NOTE — Assessment & Plan Note (Signed)
Discussed use of cologaurd since she is at average risk

## 2017-04-17 ENCOUNTER — Ambulatory Visit: Admit: 2017-04-17 | Payer: Medicare Other

## 2017-04-17 SURGERY — COLONOSCOPY WITH PROPOFOL
Anesthesia: General

## 2017-04-20 ENCOUNTER — Encounter: Payer: Self-pay | Admitting: Internal Medicine

## 2017-04-21 DIAGNOSIS — Z1212 Encounter for screening for malignant neoplasm of rectum: Secondary | ICD-10-CM | POA: Diagnosis not present

## 2017-04-21 DIAGNOSIS — Z1211 Encounter for screening for malignant neoplasm of colon: Secondary | ICD-10-CM | POA: Diagnosis not present

## 2017-04-23 LAB — COLOGUARD: COLOGUARD: NEGATIVE

## 2017-05-08 DIAGNOSIS — E2839 Other primary ovarian failure: Secondary | ICD-10-CM | POA: Diagnosis not present

## 2017-05-08 DIAGNOSIS — Z1231 Encounter for screening mammogram for malignant neoplasm of breast: Secondary | ICD-10-CM | POA: Diagnosis not present

## 2017-05-08 DIAGNOSIS — Z780 Asymptomatic menopausal state: Secondary | ICD-10-CM | POA: Diagnosis not present

## 2017-05-08 LAB — HM DEXA SCAN: HM Dexa Scan: NORMAL

## 2017-05-09 ENCOUNTER — Telehealth: Payer: Self-pay | Admitting: Internal Medicine

## 2017-05-09 NOTE — Telephone Encounter (Signed)
It has been updated

## 2017-05-09 NOTE — Telephone Encounter (Signed)
DEXA results reported via mychart.  plEase abstRACT

## 2017-05-11 ENCOUNTER — Telehealth: Payer: Self-pay | Admitting: Internal Medicine

## 2017-05-11 NOTE — Telephone Encounter (Signed)
Mammogram,  Normal,,  [facility],  please abstract

## 2017-05-12 NOTE — Telephone Encounter (Signed)
Spoke with pt and informed her of her mammogram results. Pt gave a verbal understanding.  

## 2017-05-20 DIAGNOSIS — Z23 Encounter for immunization: Secondary | ICD-10-CM | POA: Diagnosis not present

## 2017-05-21 NOTE — Telephone Encounter (Signed)
Error

## 2017-06-04 DIAGNOSIS — M7542 Impingement syndrome of left shoulder: Secondary | ICD-10-CM | POA: Diagnosis not present

## 2017-07-28 ENCOUNTER — Other Ambulatory Visit: Payer: Self-pay | Admitting: Internal Medicine

## 2017-07-28 DIAGNOSIS — E538 Deficiency of other specified B group vitamins: Secondary | ICD-10-CM

## 2017-08-07 ENCOUNTER — Telehealth: Payer: Self-pay | Admitting: Internal Medicine

## 2017-08-07 NOTE — Telephone Encounter (Signed)
Copied from [place]. Topic: Inquiry >> Aug 07, 2017  9:30 AM Malena Catholic I, NT wrote: Reason for CRM: Her Daughter, call she is concerning about her memorize things she need to know if any med can help her,she is very concern about her Mom her name is Samantha Hanna [phone]

## 2017-08-08 NOTE — Telephone Encounter (Signed)
Pt's daughter is not on DPR. Was not able to call her to get anymore informartion.

## 2017-09-03 ENCOUNTER — Telehealth: Payer: Self-pay | Admitting: Internal Medicine

## 2017-09-03 NOTE — Telephone Encounter (Signed)
Please advise 

## 2017-09-03 NOTE — Telephone Encounter (Signed)
Copied from [place] [id number]. Topic: Quick Communication - See Telephone Encounter >> Sep 03, 2017  3:34 PM Ether Griffins B wrote: CRM for notification. See Telephone encounter for:  Pt calling in today to make Dr. Derrel Nip aware that she is having surgery on her left hand 09/10/17 with Dr. Sabra Heck. The hospital is going to be sending something over for Dr. Derrel Nip to sign off on and pt was told to call and make the office aware.  09/03/17.

## 2017-09-04 ENCOUNTER — Other Ambulatory Visit: Payer: Self-pay | Admitting: Specialist

## 2017-09-04 ENCOUNTER — Telehealth: Payer: Self-pay | Admitting: Internal Medicine

## 2017-09-04 ENCOUNTER — Other Ambulatory Visit (INDEPENDENT_AMBULATORY_CARE_PROVIDER_SITE_OTHER): Payer: Medicare Other

## 2017-09-04 DIAGNOSIS — Z01818 Encounter for other preprocedural examination: Secondary | ICD-10-CM | POA: Diagnosis not present

## 2017-09-04 LAB — CBC WITH DIFFERENTIAL/PLATELET
BASOS ABS: 0 10*3/uL (ref 0.0–0.1)
BASOS PCT: 0.6 % (ref 0.0–3.0)
EOS ABS: 0.1 10*3/uL (ref 0.0–0.7)
Eosinophils Relative: 1.3 % (ref 0.0–5.0)
HEMATOCRIT: 42.3 % (ref 36.0–46.0)
HEMOGLOBIN: 14 g/dL (ref 12.0–15.0)
LYMPHS PCT: 25.8 % (ref 12.0–46.0)
Lymphs Abs: 1.4 10*3/uL (ref 0.7–4.0)
MCHC: 33 g/dL (ref 30.0–36.0)
MCV: 95.6 fl (ref 78.0–100.0)
Monocytes Absolute: 0.5 10*3/uL (ref 0.1–1.0)
Monocytes Relative: 8.6 % (ref 3.0–12.0)
Neutro Abs: 3.3 10*3/uL (ref 1.4–7.7)
Neutrophils Relative %: 63.7 % (ref 43.0–77.0)
Platelets: 220 10*3/uL (ref 150.0–400.0)
RBC: 4.43 Mil/uL (ref 3.87–5.11)
RDW: 12.9 % (ref 11.5–15.5)
WBC: 5.2 10*3/uL (ref 4.0–10.5)

## 2017-09-04 LAB — COMPREHENSIVE METABOLIC PANEL
ALT: 16 U/L (ref 0–35)
AST: 17 U/L (ref 0–37)
Albumin: 4.6 g/dL (ref 3.5–5.2)
Alkaline Phosphatase: 62 U/L (ref 39–117)
BUN: 15 mg/dL (ref 6–23)
CHLORIDE: 106 meq/L (ref 96–112)
CO2: 30 mEq/L (ref 19–32)
CREATININE: 0.7 mg/dL (ref 0.40–1.20)
Calcium: 9.2 mg/dL (ref 8.4–10.5)
GFR: 86.9 mL/min (ref >60.00)
GLUCOSE: 99 mg/dL (ref 70–99)
Potassium: 4.1 mEq/L (ref 3.5–5.1)
SODIUM: 142 meq/L (ref 135–145)
Total Bilirubin: 0.4 mg/dL (ref 0.2–1.2)
Total Protein: 7.4 g/dL (ref 6.0–8.3)

## 2017-09-04 LAB — URINALYSIS, ROUTINE W REFLEX MICROSCOPIC
Bilirubin Urine: NEGATIVE
HGB URINE DIPSTICK: NEGATIVE
Nitrite: NEGATIVE
RBC / HPF: NONE SEEN (ref 0–?)
Specific Gravity, Urine: >=1.03 — AB (ref 1.000–1.030)
Total Protein, Urine: NEGATIVE
UROBILINOGEN UA: 0.2 (ref 0.0–1.0)
Urine Glucose: NEGATIVE
pH: 5.5 (ref 5.0–8.0)

## 2017-09-04 NOTE — Telephone Encounter (Signed)
Yes, this was a duplicate phone message that was sent yesterday. Patient has been scheduled with you on Monday 09/08/17 and she was also scheduled for labs today.

## 2017-09-04 NOTE — Telephone Encounter (Signed)
Patient has been scheduled for labs today and an appointment with Dr. Derrel Nip on 09/08/2017 at 11:30.

## 2017-09-04 NOTE — Telephone Encounter (Signed)
Dr Sabra Heck is requesting preoperative clearance for Samantha Hanna,.  This will require labs ,  And EKG and an office visit.  Please schedule patient

## 2017-09-04 NOTE — Telephone Encounter (Signed)
FYI

## 2017-09-04 NOTE — Telephone Encounter (Signed)
LMTCB. Need to schedule pt for a lab appt tomorrow so that we can get her lab results back in time for her surgery. They are non fasting labs. Labs have been ordered. Also need to schedule pt for an appt with Dr. Derrel Nip on Monday 09/08/2017 at 11:30am per Dr. Lupita Dawn approval.   [facility] may speak with pt.

## 2017-09-04 NOTE — Telephone Encounter (Signed)
You were supposed to inform her that she needs labs done this week and an appt on Monday per Jessica's note.

## 2017-09-04 NOTE — Telephone Encounter (Signed)
Copied from [place] [id number]. Topic: Quick Communication - See Telephone Encounter >> Sep 04, 2017  1:20 PM Cleaster Corin, NT wrote: CRM for notification. See Telephone encounter for:   09/04/17. Pt. Returning call was able to put pt. On schedule for labs today at 3pm. Was not able to schedule appt. For pt. On 09-08-17 @ 11:30am due to overbooking Dr. Derrel Nip and I am not able to override this. Please schedule pt. Pt. Can be reached at [phone]

## 2017-09-05 NOTE — Telephone Encounter (Signed)
Thank you :)

## 2017-09-07 ENCOUNTER — Encounter: Payer: Self-pay | Admitting: Internal Medicine

## 2017-09-08 ENCOUNTER — Other Ambulatory Visit: Payer: Self-pay

## 2017-09-08 ENCOUNTER — Encounter: Payer: Self-pay | Admitting: Internal Medicine

## 2017-09-08 ENCOUNTER — Ambulatory Visit (INDEPENDENT_AMBULATORY_CARE_PROVIDER_SITE_OTHER): Payer: Medicare Other | Admitting: Internal Medicine

## 2017-09-08 ENCOUNTER — Encounter
Admission: RE | Admit: 2017-09-08 | Discharge: 2017-09-08 | Disposition: A | Discharge status: Home / Self Care | Payer: Medicare Other | Source: Ambulatory Visit | Attending: Specialist | Admitting: Specialist

## 2017-09-08 VITALS — BP 118/76 | HR 74 | Temp 97.5°F | Resp 15 | Ht 66.75 in | Wt 146.6 lb

## 2017-09-08 DIAGNOSIS — R4181 Age-related cognitive decline: Secondary | ICD-10-CM | POA: Diagnosis not present

## 2017-09-08 DIAGNOSIS — Z01818 Encounter for other preprocedural examination: Secondary | ICD-10-CM

## 2017-09-08 DIAGNOSIS — R8290 Unspecified abnormal findings in urine: Secondary | ICD-10-CM

## 2017-09-08 HISTORY — DX: Malignant (primary) neoplasm, unspecified: C80.1

## 2017-09-08 HISTORY — DX: Unspecified osteoarthritis, unspecified site: M19.90

## 2017-09-08 HISTORY — DX: Hypothyroidism, unspecified: E03.9

## 2017-09-08 HISTORY — DX: Gastro-esophageal reflux disease without esophagitis: K21.9

## 2017-09-08 LAB — URINALYSIS, ROUTINE W REFLEX MICROSCOPIC
BILIRUBIN URINE: NEGATIVE
HGB URINE DIPSTICK: NEGATIVE
LEUKOCYTES UA: NEGATIVE
Nitrite: NEGATIVE
Specific Gravity, Urine: 1.025 (ref 1.000–1.030)
TOTAL PROTEIN, URINE-UPE24: NEGATIVE
URINE GLUCOSE: NEGATIVE
UROBILINOGEN UA: 0.2 (ref 0.0–1.0)
pH: 5.5 (ref 5.0–8.0)

## 2017-09-08 NOTE — Patient Instructions (Signed)
Your procedure is scheduled on: 09-10-17 Report to Same Day Surgery 2nd floor medical mall [facility] Entrance-take elevator on left to 2nd floor.  Check in with surgery information desk.) To find out your arrival time please call [phone] between 1PM - 3PM on 09-09-17  Remember: Instructions that are not followed completely may result in serious medical risk, up to and including death, or upon the discretion of your surgeon and anesthesiologist your surgery may need to be rescheduled.    _x___ 1. Do not eat food after midnight the night before your procedure. NO GUM OR CANDY AFTER MIDNIGHT.  You may drink clear liquids up to 2 hours before you are scheduled to arrive at the hospital for your procedure.  Do not drink clear liquids within 2 hours of your scheduled arrival to the hospital.  Clear liquids include  --Water or Apple juice without pulp  --Clear carbohydrate beverage such as ClearFast or Gatorade  --Black Coffee or Clear Tea (No milk, no creamers, do not add anything to the coffee or Tea     __x__ 2. No Alcohol for 24 hours before or after surgery.   __x__3. No Smoking for 24 prior to surgery.   ____  4. Bring all medications with you on the day of surgery if instructed.    __x__ 5. Notify your doctor if there is any change in your medical condition     (cold, fever, infections).     Do not wear jewelry, make-up, hairpins, clips or nail polish.  Do not wear lotions, powders, or perfumes. You may wear deodorant.  Do not shave 48 hours prior to surgery. Men may shave face and neck.  Do not bring valuables to the hospital.    [facility] is not responsible for any belongings or valuables.               Contacts, dentures or bridgework may not be worn into surgery.  Leave your suitcase in the car. After surgery it may be brought to your room.  For patients admitted to the hospital, discharge time is determined by your treatment team.   Patients discharged the day of  surgery will not be allowed to drive home.  You will need someone to drive you home and stay with you the night of your procedure.    Please read over the following fact sheets that you were given:   [facility] Preparing for Surgery and or MRSA Information   _x___ TAKE THE FOLLOWING MEDICATION THE MORNING OF SURGERY WITH A SMALL SIP OF WATER. These include:  1. SYNTHROID  2.  3.  4.  5.  6.  ____Fleets enema or Magnesium Citrate as directed.   ____ Use CHG Soap or sage wipes as directed on instruction sheet   ____ Use inhalers on the day of surgery and bring to hospital day of surgery  ____ Stop Metformin and Janumet 2 days prior to surgery.    ____ Take 1/2 of usual insulin dose the night before surgery and none on the morning surgery.   ____ Follow recommendations from Cardiologist, Pulmonologist or PCP regarding          stopping Aspirin, Coumadin, Plavix ,Eliquis, Effient, or Pradaxa, and Pletal.  X____Stop Anti-inflammatories such as Advil, Aleve, Ibuprofen, Motrin, Naproxen, MOBIC, Naprosyn, Goodies powders or aspirin products NOW-OK to take Tylenol   _x___ Stop supplements until after surgery-STOP OMEGA 3 NOW-MAY RESUME AFTER SURGERY   ____ Bring C-Pap to the hospital.

## 2017-09-08 NOTE — Assessment & Plan Note (Addendum)
I have examined patient today,  Reviewed today's EKG,  And Friday's preoperative labs..  She  is considered to be at low risk  For perioperative complications  Based on today's exam and history.  EKG is normal based on my review   UA is being repeated due to presence of 7-10 WBCs per [facility], but she is asymptomatic.

## 2017-09-08 NOTE — Pre-Procedure Instructions (Signed)
Crecencio Mc, MD  Physician  Internal Medicine  Progress Notes  Sign at close encounter  Encounter Date:  09/08/2017          Sign at close encounter      Expand All Collapse All       [] Hide copied text  [] Hover for details     Subjective:  Patient ID: Samantha Hanna, female    DOB: February 17, 1941  Age: 77 y.o. MRN: [id number]  CC: The primary encounter diagnosis was Preoperative clearance. Diagnoses of Abnormal urinalysis and Preoperative evaluation to rule out surgical contraindication were also pertinent to this visit.  HPI Samantha Hanna presents for preoperative evaluation .  She is scheduled to undergo elective surgery on her left thumb by Dr Sabra Heck on Wednesday JAN 16TH.  SHE HAS NO history of CAD.  CKD,  Or DM.  Does not take antiplatelet or anticoagulants.  Denies chest pain,  Shortness of breath of cough in the last 6 months.   Preoperative labs were normal on Friday Jan 11 with the exception of a urinalysis.  She is asymptomatic.  Repeat UA is being done today with culture    RecentLabs       Lab Results  Component Value Date   TSH 0.92 04/14/2017     RecentLabs       Lab Results  Component Value Date   WBC 5.2 09/04/2017   HGB 14.0 09/04/2017   HCT 42.3 09/04/2017   MCV 95.6 09/04/2017   PLT 220.0 09/04/2017     RecentLabs       Lab Results  Component Value Date   CREATININE 0.70 09/04/2017              Outpatient Medications Prior to Visit  Medication Sig Dispense Refill  . acetaminophen (TYLENOL) 325 MG tablet Take 325 mg by mouth every 6 (six) hours as needed for mild pain or headache.    . ALPRAZolam (XANAX) 0.25 MG tablet TAKE HALF TABLET BY MOUTH AS NEEDED FOR SLEEP    . cholecalciferol (VITAMIN D) 1000 UNITS tablet Take 1,000 Units by mouth daily.      . cyclobenzaprine (FLEXERIL) 10 MG tablet Take 10 mg by mouth daily as needed for muscle spasms.    . fluticasone (FLONASE) 50 MCG/ACT nasal spray Place  1 spray into both nostrils daily as needed for allergies or rhinitis.     . folic acid (FOLVITE) 431 MCG tablet Take 400 mcg by mouth daily.    . meloxicam (MOBIC) 7.5 MG tablet Take 1 tablet (7.5 mg total) by mouth daily. As needed for joint pain (Patient taking differently: Take 7.5 mg by mouth daily as needed for pain. As needed for joint pain) 90 tablet 1  . Multiple Vitamin (MULTIVITAMIN WITH MINERALS) TABS tablet Take 1 tablet by mouth daily.    . Omega-3 Fatty Acids (OMEGA 3 PO) Take 30 mLs by mouth daily. 800 mg of Fish Oil  per serving    . pantoprazole (PROTONIX) 40 MG tablet Take 1 tablet (40 mg total) by mouth daily. 90 tablet 1  . Polyvinyl Alcohol-Povidone (REFRESH OP) Apply 1 drop to eye daily as needed (dry eyes).    . SYNTHROID 75 MCG tablet TAKE 1 TABLET BY MOUTH DAILY ON AN EMPTYSTOMACH WITH A GLASS OF WATERAT LEAST 30-60 MINUTES BEFORE BREAKFAST 90 tablet 2  . cyanocobalamin (,VITAMIN B-12,) 1000 MCG/ML injection INJECT 1 ML INTO THE MUSCLE EVERY 14 DAYS. FOR DAILY/WEEKLY/MONTHLY PER MD DIRECTIONS FOR  B12 DEFICENCY. (Patient not taking: Reported on 09/08/2017) 10 mL 11  . mirtazapine (REMERON) 7.5 MG tablet Take 1 tablet (7.5 mg total) by mouth at bedtime. (Patient not taking: Reported on 09/08/2017) 90 tablet 1  . Syringe, Disposable, 1 ML MISC For daily/weekly/monthly B12 injections (Patient not taking: Reported on 09/04/2017) 25 each 2   No facility-administered medications prior to visit.     Review of Systems;  Patient denies headache, fevers, malaise, unintentional weight loss, skin rash, eye pain, sinus congestion and sinus pain, sore throat, dysphagia,  hemoptysis , cough, dyspnea, wheezing, chest pain, palpitations, orthopnea, edema, abdominal pain, nausea, melena, diarrhea, constipation, flank pain, dysuria, hematuria, urinary  Frequency, nocturia, numbness, tingling, seizures,  Focal weakness, Loss of consciousness,  Tremor, insomnia, depression, anxiety,  and suicidal ideation.      Objective:  BP 118/76 (BP Location: Left Arm, Patient Position: Sitting, Cuff Size: Normal)   Pulse 74   Temp (!) 97.5 F (36.4 C) (Oral)   Resp 15   Ht 5' 6.75" (1.695 m)   Wt 146 lb 9.6 oz (66.5 kg)   SpO2 96%   BMI 23.13 kg/m      BP Readings from Last 3 Encounters:  09/08/17 118/76  04/14/17 126/78  03/21/17 130/70       Wt Readings from Last 3 Encounters:  09/08/17 146 lb 9.6 oz (66.5 kg)  04/14/17 144 lb 9.6 oz (65.6 kg)  03/21/17 148 lb (67.1 kg)    General appearance: alert, cooperative and appears stated age Ears: normal TM's and external ear canals both ears Throat: lips, mucosa, and tongue normal; teeth and gums normal Neck: no adenopathy, no carotid bruit, supple, symmetrical, trachea midline and thyroid not enlarged, symmetric, no tenderness/mass/nodules Back: symmetric, no curvature. ROM normal. No CVA tenderness. Lungs: clear to auscultation bilaterally Heart: regular rate and rhythm, S1, S2 normal, no murmur, click, rub or gallop Abdomen: soft, non-tender; bowel sounds normal; no masses,  no organomegaly Pulses: 2+ and symmetric Skin: Skin color, texture, turgor normal. No rashes or lesions Lymph nodes: Cervical, supraclavicular, and axillary nodes normal.  RecentLabs  No results found for: HGBA1C    RecentLabs       Lab Results  Component Value Date   CREATININE 0.70 09/04/2017   CREATININE 0.75 04/14/2017   CREATININE 0.74 09/23/2016      RecentLabs       Lab Results  Component Value Date   WBC 5.2 09/04/2017   HGB 14.0 09/04/2017   HCT 42.3 09/04/2017   PLT 220.0 09/04/2017   GLUCOSE 99 09/04/2017   CHOL 263 (H) 04/14/2017   TRIG 93.0 04/14/2017   HDL 59.10 04/14/2017   LDLDIRECT 172.6 09/08/2013   LDLCALC 185 (H) 04/14/2017   ALT 16 09/04/2017   AST 17 09/04/2017   NA 142 09/04/2017   K 4.1 09/04/2017   CL 106 09/04/2017   CREATININE 0.70 09/04/2017   BUN 15  09/04/2017   CO2 30 09/04/2017   TSH 0.92 04/14/2017      US Abdomen Limited Ruq  Result Date: 09/27/2016 CLINICAL DATA:  Postprandial nausea, burping EXAM: US ABDOMEN LIMITED - RIGHT UPPER QUADRANT COMPARISON:  05/15/2015 FINDINGS: Gallbladder: No gallstones or wall thickening visualized. No sonographic Murphy sign noted by sonographer. Common bile duct: Diameter: Normal caliber, 2 mm Liver: No focal lesion identified. Within normal limits in parenchymal echogenicity. IMPRESSION: Unremarkable right upper quadrant ultrasound. Electronically Signed   By: Rolm Baptise M.D.   On: 09/27/2016 11:47  Assessment & Plan:      Problem List Items Addressed This Visit    Preoperative evaluation to rule out surgical contraindication    Patient  is considered to be at low risk  For perioperative complications  Based on today's exam and history.  Baseline lytes,  hgb and ekg done.  UA is being repeated due to presence of 7-10 WBCs per HPF, but she is asymptomatic.              Other Visit Diagnoses    Preoperative clearance    -  Primary   Relevant Orders   EKG 12-Lead   Abnormal urinalysis       Relevant Orders   Urine Culture   Urinalysis, Routine w reflex microscopic      I have discontinued Samantha Hanna's Syringe (Disposable). I am also having her maintain her cholecalciferol, fluticasone, mirtazapine, pantoprazole, ALPRAZolam, meloxicam, SYNTHROID, cyanocobalamin, cyclobenzaprine, folic acid, multivitamin with minerals, Omega-3 Fatty Acids (OMEGA 3 PO), acetaminophen, and Polyvinyl Alcohol-Povidone (REFRESH OP).  No orders of the defined types were placed in this encounter.   Medications Discontinued During This Encounter  Medication Reason  . Syringe, Disposable, 1 ML MISC Completed Course    Follow-up: No Follow-up on file.   Crecencio Mc, MD             Office Visit on 09/08/2017        Detailed Report

## 2017-09-08 NOTE — Progress Notes (Addendum)
Subjective:  Patient ID: Samantha Hanna, female    DOB: [birth year]  Age: 77 y.o. MRN: [id number]  CC: The primary encounter diagnosis was Preoperative clearance. 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  HPI Samantha Hanna presents for preoperative evaluation .  She is scheduled to undergo elective surgery on her left thumb by Dr Sabra Heck on Wednesday JAN 16TH.She is not sure whether she will be having general anasthesia or whether she will be wearing a cast, or for how long.  Her husband has Parkinson's Disease and is a retired Engineer, drilling,  But has physical limitations.  Her daughter [name] will be assisting at home but is concerned that her mother may need additional help at home post operatively because she has been forgetful about medications in the recent past   SHE HAS NO history of CAD.  CKD,  Or DM.  Does not take antiplatelet or anticoagulants.  Denies and episodes of chest pain,  Shortness of breath of cough in the last 6 months.   Preoperative labs were normal on Friday Jan 11 with the exception of a urinalysis showing 7-10  WBC per HPF. .  She is asymptomatic.  Repeat UA is being done today with culture    Lab Results  Component Value Date   TSH 0.92 04/14/2017   Lab Results  Component Value Date   WBC 5.2 09/04/2017   HGB 14.0 09/04/2017   HCT 42.3 09/04/2017   MCV 95.6 09/04/2017   PLT 220.0 09/04/2017   Lab Results  Component Value Date   CREATININE 0.70 09/04/2017      Outpatient Medications Prior to Visit  Medication Sig Dispense Refill  . acetaminophen (TYLENOL) 325 MG tablet Take 325 mg by mouth every 6 (six) hours as needed for mild pain or headache.    . ALPRAZolam (XANAX) 0.25 MG tablet TAKE HALF TABLET BY MOUTH AS NEEDED FOR SLEEP-PT NOT CURRENTLY TAKING    . cholecalciferol (VITAMIN D) 1000 UNITS tablet Take 1,000 Units by mouth daily.        . cyclobenzaprine (FLEXERIL) 10 MG tablet Take 10 mg by mouth daily as needed for muscle spasms.    . fluticasone (FLONASE) 50 MCG/ACT nasal spray Place 1 spray into both nostrils daily as needed for allergies or rhinitis.     . folic acid (FOLVITE) 585 MCG tablet Take 400 mcg by mouth daily.    . Multiple Vitamin (MULTIVITAMIN WITH MINERALS) TABS tablet Take 1 tablet by mouth daily.    . Omega-3 Fatty Acids (OMEGA 3 PO) Take 30 mLs by mouth daily. 800 mg of Fish Oil  per serving    . pantoprazole (PROTONIX) 40 MG tablet Take 1 tablet (40 mg total) by mouth daily. (Patient not taking: Reported on 09/08/2017) 90 tablet 1  . Polyvinyl Alcohol-Povidone (REFRESH OP) Apply 1 drop to eye daily as needed (dry eyes).    . SYNTHROID 75 MCG tablet TAKE 1 TABLET BY MOUTH DAILY ON AN EMPTYSTOMACH WITH A GLASS OF WATERAT LEAST 30-60 MINUTES BEFORE BREAKFAST 90 tablet 2  . meloxicam (MOBIC) 7.5 MG tablet Take 1 tablet (7.5 mg total) by mouth daily. As needed for joint pain (Patient taking differently: Take 7.5 mg by mouth daily as needed for pain. As needed for joint pain) 90 tablet 1  . cyanocobalamin (,VITAMIN B-12,) 1000 MCG/ML injection INJECT 1 ML INTO THE MUSCLE EVERY 14 DAYS. FOR DAILY/WEEKLY/MONTHLY PER  MD DIRECTIONS FOR B12 DEFICENCY. 10 mL 11  . mirtazapine (REMERON) 7.5 MG tablet Take 1 tablet (7.5 mg total) by mouth at bedtime. 90 tablet 1  . Syringe, Disposable, 1 ML MISC For daily/weekly/monthly B12 injections (Patient not taking: Reported on 09/04/2017) 25 each 2   No facility-administered medications prior to visit.     Review of Systems;  Patient denies headache, fevers, malaise, unintentional weight loss, skin rash, eye pain, sinus congestion and sinus pain, sore throat, dysphagia,  hemoptysis , cough, dyspnea, wheezing, chest pain, palpitations, orthopnea, edema, abdominal pain, nausea, melena, diarrhea, constipation, flank pain, dysuria, hematuria, urinary  Frequency, nocturia, numbness,  tingling, seizures,  Focal weakness, Loss of consciousness,  Tremor, insomnia, depression, anxiety, and suicidal ideation.      Objective:  BP 118/76 (BP Location: Left Arm, Patient Position: Sitting, Cuff Size: Normal)   Pulse 74   Temp (!) 97.5 F (36.4 C) (Oral)   Resp 15   Ht 5' 6.75" (1.695 m)   Wt 146 lb 9.6 oz (66.5 kg)   SpO2 96%   BMI 23.13 kg/m   BP Readings from Last 3 Encounters:  09/10/17 (!) 172/93  09/08/17 118/76  04/14/17 126/78    Wt Readings from Last 3 Encounters:  09/10/17 142 lb (64.4 kg)  09/08/17 146 lb 9.6 oz (66.5 kg)  04/14/17 144 lb 9.6 oz (65.6 kg)    General appearance: alert, cooperative and appears stated age Ears: normal TM's and external ear canals both ears Throat: lips, mucosa, and tongue normal; teeth and gums normal Neck: no adenopathy, no carotid bruit, supple, symmetrical, trachea midline and thyroid not enlarged, symmetric, no tenderness/mass/nodules Back: symmetric, no curvature. ROM normal. No CVA tenderness. Lungs: clear to auscultation bilaterally Heart: regular rate and rhythm, S1, S2 normal, no murmur, click, rub or gallop Abdomen: soft, non-tender; bowel sounds normal; no masses,  no organomegaly Pulses: 2+ and symmetric Skin: Skin color, texture, turgor normal. No rashes or lesions Lymph nodes: Cervical, supraclavicular, and axillary nodes normal.  No results found for: HGBA1C  Lab Results  Component Value Date   CREATININE 0.70 09/04/2017   CREATININE 0.75 04/14/2017   CREATININE 0.74 09/23/2016    Lab Results  Component Value Date   WBC 5.2 09/04/2017   HGB 14.0 09/04/2017   HCT 42.3 09/04/2017   PLT 220.0 09/04/2017   GLUCOSE 99 09/04/2017   CHOL 263 (H) 04/14/2017   TRIG 93.0 04/14/2017   HDL 59.10 04/14/2017   LDLDIRECT 172.6 09/08/2013   LDLCALC 185 (H) 04/14/2017   ALT 16 09/04/2017   AST 17 09/04/2017   NA 142 09/04/2017   K 4.1 09/04/2017   CL 106 09/04/2017   CREATININE 0.70 09/04/2017   BUN  15 09/04/2017   CO2 30 09/04/2017   TSH 0.92 04/14/2017    US Abdomen Limited Ruq  Result Date: 09/27/2016 CLINICAL DATA:  Postprandial nausea, burping EXAM: US ABDOMEN LIMITED - RIGHT UPPER QUADRANT COMPARISON:  05/15/2015 FINDINGS: Gallbladder: No gallstones or wall thickening visualized. No sonographic Murphy sign noted by sonographer. Common bile duct: Diameter: Normal caliber, 2 mm Liver: No focal lesion identified. Within normal limits in parenchymal echogenicity. IMPRESSION: Unremarkable right upper quadrant ultrasound. Electronically Signed   By: Rolm Baptise M.D.   On: 09/27/2016 11:47    Assessment & Plan:   Problem List Items Addressed This Visit    Abnormal urinalysis   Relevant Orders   Urine Culture (Completed)   Urinalysis, Routine w reflex microscopic (Completed)  Preoperative evaluation to rule out surgical contraindication    I have examined patient today,  Reviewed today's EKG,  And Friday's preoperative labs..  She  is considered to be at low risk  For perioperative complications  Based on today's exam and history.  EKG is normal based on my review   UA is being repeated due to presence of 7-10 WBCs per HPF, but she is asymptomatic.       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  HH referral in process.        Other Visit Diagnoses    Preoperative clearance    -  Primary   Relevant Orders   EKG 12-Lead (Completed)      I have discontinued Mykelle B. Clayborn's Syringe (Disposable). I am also having her maintain her cholecalciferol, fluticasone, mirtazapine, pantoprazole, ALPRAZolam, SYNTHROID, cyanocobalamin, cyclobenzaprine, folic acid, multivitamin with minerals, Omega-3 Fatty Acids (OMEGA 3 PO), acetaminophen, and Polyvinyl Alcohol-Povidone (REFRESH OP).  No orders of the  defined types were placed in this encounter.   Medications Discontinued During This Encounter  Medication Reason  . Syringe, Disposable, 1 ML MISC Completed Course    Follow-up: No Follow-up on file.   Crecencio Mc, MD

## 2017-09-09 ENCOUNTER — Encounter: Payer: Self-pay | Admitting: Internal Medicine

## 2017-09-09 ENCOUNTER — Telehealth: Payer: Self-pay | Admitting: Internal Medicine

## 2017-09-09 LAB — URINE CULTURE
MICRO NUMBER: 90053760
SPECIMEN QUALITY: ADEQUATE

## 2017-09-09 MED ORDER — CLINDAMYCIN PHOSPHATE 600 MG/50ML IV SOLN
600.0000 mg | Freq: Three times a day (TID) | INTRAVENOUS | Status: DC
  Administered 2017-09-10: 600 mg via INTRAVENOUS

## 2017-09-09 MED ORDER — CEFAZOLIN SODIUM-DEXTROSE 2-4 GM/100ML-% IV SOLN
2.0000 g | INTRAVENOUS | Status: AC
  Administered 2017-09-10: 2 g via INTRAVENOUS

## 2017-09-09 NOTE — Telephone Encounter (Signed)
Copied from [place] [id number]. Topic: Quick Communication - See Telephone Encounter >> Sep 09, 2017 10:24 AM Samantha Hanna wrote: CRM for notification. See Telephone encounter for:  Need surgical clearance faxed asap, patient was seen on 09/08/17 for surgical clearance. Patient is scheduled for surgery tomorrow. Ofc is re-faxing clearance.  09/09/17.

## 2017-09-09 NOTE — Pre-Procedure Instructions (Signed)
  Crecencio Mc, MD  Physician  Internal Medicine  Assessment & Plan Note  Edited  Encounter Date:  09/08/2017          Related Problems  Preoperative evaluation to rule out surgical contraindication           [] Hide copied text  [] Hover for details   I have examined patient today,  Reviewed today's EKG,  And Friday's preoperative labs..  She  is considered to be at low risk  For perioperative complications  Based on today's exam and history.  EKG is normal based on my review   UA is being repeated due to presence of 7-10 WBCs per HPF, but she is asymptomatic.       Electronically signed by Crecencio Mc, MD at 09/08/2017 12:10 PM Electronically signed by Crecencio Mc, MD

## 2017-09-09 NOTE — Pre-Procedure Instructions (Signed)
Medical clearance on chart from Dr Derrel Nip

## 2017-09-09 NOTE — Telephone Encounter (Signed)
Please advise 

## 2017-09-09 NOTE — Pre-Procedure Instructions (Signed)
Samantha Hanna  ECHO STRESS TEST WO IMAGE AGENT Wynonia Musty)  Order# [id number]  Reading physician: Minna Merritts, MD Ordering physician: Wende Bushy, MD Study date: 10/18/16  Result Notes for ECHOCARDIOGRAM STRESS TEST   Notes Recorded by Valora Corporal, RN on 10/22/2016 at 12:50 PM EST Reviewed results with patient and she verbalized understanding with no further questions at this time. ------  Notes Recorded by Wende Bushy, MD on 10/22/2016 at 10:21 AM EST Overall, normal stress echo, reassuring study      Conclusions   Result status: Final result                           *[place]                  [address]                        [place], [place] [address]                            [phone]  ------------------------------------------------------------------- Stress Echocardiography  Patient:    Samantha, Hanna MR #:       [id number] Study Date: 10/18/2016 Gender:     F Age:        4 Height:     172.7 cm Weight:     68.2 kg BSA:        1.81 m^2 Pt. Status: Room:   PERFORMING   [place], [place]  SONOGRAPHER  Pilar Jarvis, RVT, RDCS, RDMS  ATTENDING    Wende Bushy, MD  ORDERING     Wende Bushy, MD  REFERRING    Wende Bushy, MD  cc:  -------------------------------------------------------------------  ------------------------------------------------------------------- History:   PMH:  Patient denies any h/o dyspnea.  Angina pectoris. Risk factors:  Dyslipidemia.  ------------------------------------------------------------------- Study Conclusions  - Stress ECG conclusions: There were no stress arrhythmias or   conduction abnormalities. The stress ECG was normal. - Staged echo: Left ventricular ejection fraction was normal at   rest and with stress. Normal echo stress - Peak stress: The estimated LV ejection fraction was 65-70%. - Normal blood pressure response to exercise.   Target heart rate  achieved.  Impressions:  - Overall a normal stress echo study.  ------------------------------------------------------------------- Study data:   Study status:  Routine.  Consent:  The risks, benefits, and alternatives to the procedure were explained to the patient and informed consent was obtained.  Procedure:  Initial setup. The patient was brought to the laboratory. A baseline ECG was recorded. Surface ECG leads and automatic cuff blood pressure measurements were monitored. Treadmill exercise testing was performed using the Bruce protocol. The patient exercised for 6 min 51 sec, to protocol stage 3, to a maximal work rate of 8.2 mets. Exercise was terminated due to severe fatigue and dizziness. Transthoracic stress echocardiography for chest pain evaluation. Image quality was fair. The study was technically limited due to poor acoustic window availability. Images were captured at baseline and peak exercise.  Study completion:  The patient tolerated the procedure well. There were no complications.          Bruce protocol. Stress echocardiography.  Birthdate:  Patient birthdate: 1941-06-11.  Age:  Patient is 77 yr old.  Sex:  Gender: female. BMI: 22.9 kg/m^2.  Blood pressure:     139/84  Patient status: Outpatient.  Study  date:  Study date: 10/18/2016. Study time: 07:19 AM.  -------------------------------------------------------------------  ------------------------------------------------------------------- Baseline ECG:  Normal.  ------------------------------------------------------------------- Stress protocol:  +---------------------+---+------------+-------------------------+ !Stage                !HR !BP (mmHg)   !Symptoms                 ! +---------------------+---+------------+-------------------------+ !Baseline             !70 !139/84 (102)!None                     ! +---------------------+---+------------+-------------------------+ !Stage 1               !114!150/67 (95) !-------------------------! +---------------------+---+------------+-------------------------+ !Stage 2              !134!146/71 (96) !-------------------------! +---------------------+---+------------+-------------------------+ !Stage 3              !140!------------!Severe fatigue, dizziness! +---------------------+---+------------+-------------------------+ !Immediate post stress!146!------------!-------------------------! +---------------------+---+------------+-------------------------+ !Recovery; 1 min      !270!350/09 (102)!-------------------------! +---------------------+---+------------+-------------------------+ !Recovery; 2 min      !123!------------!-------------------------! +---------------------+---+------------+-------------------------+ !Recovery; 3 min      !115!------------!-------------------------! +---------------------+---+------------+-------------------------+ !Recovery; 5 min      !108!158/79 (105)!-------------------------! +---------------------+---+------------+-------------------------+ !Recovery; 8 min      !95 !133/78 (96) !-------------------------! +---------------------+---+------------+-------------------------+  ------------------------------------------------------------------- Stress results:   Maximal heart rate during stress was 146 bpm (99% of maximal predicted heart rate). The maximal predicted heart rate was 147 bpm.The target heart rate was achieved. The heart rate response to stress was normal. There was a normal resting blood pressure with an appropriate response to stress. The rate-pressure product for the peak heart rate and blood pressure was 20295 mm Hg/min.  The patient experienced no chest pain during stress.  ------------------------------------------------------------------- Stress ECG:  There were no stress arrhythmias or conduction abnormalities.  The stress ECG was  normal.  ------------------------------------------------------------------- Baseline:  - The estimated LV ejection fraction was 60-65%. - Normal wall motion; no LV regional wall motion abnormalities.  Peak stress:  - The estimated LV ejection fraction was 65-70%. - Normal wall motion; no LV regional wall motion abnormalities. - No evidence for new LV regional wall motion abnormalities.  ------------------------------------------------------------------- Stress echo results:     Left ventricular ejection fraction was normal at rest and with stress. Normal echo stress   ------------------------------------------------------------------- Prepared and Electronically Authenticated by  Esmond Plants, MD, [facility] 2018-02-23T20:00:42  [facility] Images   Show images for ECHOCARDIOGRAM STRESS TEST  Patient Information   Patient Name Samantha, Hanna Sex Female DOB 1940-10-30 SSN [id number]  Reason For Exam  Priority: Routine  Dx: Shortness of breath [R06.02 (ICD-10-CM)]  Surgical History   Surgical History   No past medical history on file.    Other Surgical History   Procedure Laterality Date Comment Source  APPENDECTOMY    Provider  MELANOMA EXCISION   x 2, Dr. Sharlett Iles Provider    Performing Technologist/Nurse   Performing Technologist/Nurse: Pilar Jarvis T  Stress Findings   ECG Baseline ECG exhibits normal sinus rhythm..  Stress Findings The patient exercised following the Bruce protocol.  The patient reported dizziness and fatigue during the stress test. The patient experienced no angina during the stress test.   The test was stopped because the patient complained of fatigue.   Blood pressure and heart rate demonstrated a normal response to exercise. Blood pressure demonstrated a normal response to exercise. Overall, the patient's exercise capacity was normal.   Recovery time: 8.3 minutes. The patient's response to exercise was adequate for  diagnosis.  Implants      No active implants to display in this view.  Order-Level Documents - 10/18/2016:   Scan on 10/18/2016 10:09 AM by Default, Provider, MD      Encounter-Level Documents - 10/18/2016:   Electronic signature on 10/18/2016 8:16 AM      Signed   Electronically signed by Minna Merritts, MD on 10/18/16 at 2000 EST  Printable Result Report   Result Report   External Result Report   External Result Report     Samantha Hanna  ECHO STRESS TEST WO IMAGE AGENT ([place])  Order# [id number]  Reading physician: Minna Merritts, MD Ordering physician: Wende Bushy, MD Study date: 10/18/16  Result Notes for ECHOCARDIOGRAM STRESS TEST   Notes Recorded by Valora Corporal, RN on 10/22/2016 at 12:50 PM EST Reviewed results with patient and she verbalized understanding with no further questions at this time. ------  Notes Recorded by Wende Bushy, MD on 10/22/2016 at 10:21 AM EST Overall, normal stress echo, reassuring study      Conclusions   Result status: Final result                           *[place]                  [address]                        [place],  [address]                            [phone]  ------------------------------------------------------------------- Stress Echocardiography  Patient:    Samantha, Hanna MR #:       [id number] Study Date: 10/18/2016 Gender:     F Age:        67 Height:     172.7 cm Weight:     68.2 kg BSA:        1.81 m^2 Pt. Status: Room:   PERFORMING   [place], [place]  SONOGRAPHER  Pilar Jarvis, RVT, RDCS, RDMS  ATTENDING    Wende Bushy, MD  ORDERING     Wende Bushy, MD  REFERRING    Wende Bushy, MD  cc:  -------------------------------------------------------------------  ------------------------------------------------------------------- History:   PMH:  Patient denies any h/o dyspnea.  Angina pectoris. Risk factors:   Dyslipidemia.  ------------------------------------------------------------------- Study Conclusions  - Stress ECG conclusions: There were no stress arrhythmias or   conduction abnormalities. The stress ECG was normal. - Staged echo: Left ventricular ejection fraction was normal at   rest and with stress. Normal echo stress - Peak stress: The estimated LV ejection fraction was 65-70%. - Normal blood pressure response to exercise.   Target heart rate achieved.  Impressions:  - Overall a normal stress echo study.  ------------------------------------------------------------------- Study data:   Study status:  Routine.  Consent:  The risks, benefits, and alternatives to the procedure were explained to the patient and informed consent was obtained.  Procedure:  Initial setup. The patient was brought to the laboratory. A baseline ECG was recorded. Surface ECG leads and automatic cuff blood pressure measurements were monitored. Treadmill exercise testing was performed using the Bruce protocol. The patient exercised for 6 min 51 sec, to protocol stage 3, to a maximal work rate of 8.2 mets. Exercise was terminated due to severe fatigue and dizziness. Transthoracic stress  echocardiography for chest pain evaluation. Image quality was fair. The study was technically limited due to poor acoustic window availability. Images were captured at baseline and peak exercise.  Study completion:  The patient tolerated the procedure well. There were no complications.          Bruce protocol. Stress echocardiography.  Birthdate:  Patient birthdate: 1941/08/13.  Age:  Patient is 77 yr old.  Sex:  Gender: female. BMI: 22.9 kg/m^2.  Blood pressure:     139/84  Patient status: Outpatient.  Study date:  Study date: 10/18/2016. Study time: 07:19 AM.  -------------------------------------------------------------------  ------------------------------------------------------------------- Baseline ECG:   Normal.  ------------------------------------------------------------------- Stress protocol:  +---------------------+---+------------+-------------------------+ !Stage                !HR !BP (mmHg)   !Symptoms                 ! +---------------------+---+------------+-------------------------+ !Baseline             !70 !139/84 (102)!None                     ! +---------------------+---+------------+-------------------------+ !Stage 1              !114!150/67 (95) !-------------------------! +---------------------+---+------------+-------------------------+ !Stage 2              !134!146/71 (96) !-------------------------! +---------------------+---+------------+-------------------------+ !Stage 3              !140!------------!Severe fatigue, dizziness! +---------------------+---+------------+-------------------------+ !Immediate post stress!146!------------!-------------------------! +---------------------+---+------------+-------------------------+ !Recovery; 1 min      !300!923/30 (102)!-------------------------! +---------------------+---+------------+-------------------------+ !Recovery; 2 min      !123!------------!-------------------------! +---------------------+---+------------+-------------------------+ !Recovery; 3 min      !115!------------!-------------------------! +---------------------+---+------------+-------------------------+ !Recovery; 5 min      !108!158/79 (105)!-------------------------! +---------------------+---+------------+-------------------------+ !Recovery; 8 min      !95 !133/78 (96) !-------------------------! +---------------------+---+------------+-------------------------+  ------------------------------------------------------------------- Stress results:   Maximal heart rate during stress was 146 bpm (99% of maximal predicted heart rate). The maximal predicted heart rate was 147 bpm.The target heart rate was achieved. The heart rate response  to stress was normal. There was a normal resting blood pressure with an appropriate response to stress. The rate-pressure product for the peak heart rate and blood pressure was 20295 mm Hg/min.  The patient experienced no chest pain during stress.  ------------------------------------------------------------------- Stress ECG:  There were no stress arrhythmias or conduction abnormalities.  The stress ECG was normal.  ------------------------------------------------------------------- Baseline:  - The estimated LV ejection fraction was 60-65%. - Normal wall motion; no LV regional wall motion abnormalities.  Peak stress:  - The estimated LV ejection fraction was 65-70%. - Normal wall motion; no LV regional wall motion abnormalities. - No evidence for new LV regional wall motion abnormalities.  ------------------------------------------------------------------- Stress echo results:     Left ventricular ejection fraction was normal at rest and with stress. Normal echo stress   ------------------------------------------------------------------- Prepared and Electronically Authenticated by  Esmond Plants, MD, [facility] 2018-02-23T20:00:42  [facility] Images   Show images for ECHOCARDIOGRAM STRESS TEST  Patient Information   Patient Name Samantha, Hanna Sex Female DOB 02/16/1941 SSN [id number]  Reason For Exam  Priority: Routine  Dx: Shortness of breath [R06.02 (ICD-10-CM)]  Surgical History   Surgical History   No past medical history on file.    Other Surgical History   Procedure Laterality Date Comment Source  APPENDECTOMY    Provider  MELANOMA EXCISION   x 2, Dr. Sharlett Iles Provider    Performing Technologist/Nurse   Performing Technologist/Nurse: Pilar Jarvis T  Stress Findings  ECG Baseline ECG exhibits normal sinus rhythm..  Stress Findings The patient exercised following the Bruce protocol.  The patient reported dizziness and fatigue during the stress test. The  patient experienced no angina during the stress test.   The test was stopped because the patient complained of fatigue.   Blood pressure and heart rate demonstrated a normal response to exercise. Blood pressure demonstrated a normal response to exercise. Overall, the patient's exercise capacity was normal.   Recovery time: 8.3 minutes. The patient's response to exercise was adequate for diagnosis.  Implants     No active implants to display in this view.  Order-Level Documents - 10/18/2016:   Scan on 10/18/2016 10:09 AM by Default, Provider, MD      Encounter-Level Documents - 10/18/2016:   Electronic signature on 10/18/2016 8:16 AM      Signed   Electronically signed by Minna Merritts, MD on 10/18/16 at 2000 EST  Printable Result Report   Result Report   External Result Report   External Result Report

## 2017-09-09 NOTE — Telephone Encounter (Signed)
Surgical clearance was faxed this morning.

## 2017-09-10 ENCOUNTER — Encounter
Admission: RE | Disposition: A | Discharge status: Home / Self Care | Payer: Self-pay | Source: Ambulatory Visit | Attending: Specialist

## 2017-09-10 ENCOUNTER — Ambulatory Visit: Payer: Medicare Other | Admitting: Anesthesiology

## 2017-09-10 ENCOUNTER — Ambulatory Visit
Admission: RE | Admit: 2017-09-10 | Discharge: 2017-09-10 | Disposition: A | Discharge status: Home / Self Care | Payer: Medicare Other | Source: Ambulatory Visit | Attending: Specialist | Admitting: Specialist

## 2017-09-10 DIAGNOSIS — E039 Hypothyroidism, unspecified: Secondary | ICD-10-CM | POA: Insufficient documentation

## 2017-09-10 DIAGNOSIS — F418 Other specified anxiety disorders: Secondary | ICD-10-CM | POA: Diagnosis not present

## 2017-09-10 DIAGNOSIS — M1812 Unilateral primary osteoarthritis of first carpometacarpal joint, left hand: Secondary | ICD-10-CM | POA: Diagnosis not present

## 2017-09-10 DIAGNOSIS — E785 Hyperlipidemia, unspecified: Secondary | ICD-10-CM | POA: Insufficient documentation

## 2017-09-10 DIAGNOSIS — K219 Gastro-esophageal reflux disease without esophagitis: Secondary | ICD-10-CM | POA: Insufficient documentation

## 2017-09-10 DIAGNOSIS — Z79899 Other long term (current) drug therapy: Secondary | ICD-10-CM | POA: Insufficient documentation

## 2017-09-10 DIAGNOSIS — G4730 Sleep apnea, unspecified: Secondary | ICD-10-CM | POA: Diagnosis not present

## 2017-09-10 HISTORY — PX: CARPOMETACARPAL (CMC) FUSION OF THUMB: SHX6290

## 2017-09-10 SURGERY — CARPOMETACARPAL (CMC) FUSION OF THUMB
Anesthesia: General | Site: Thumb | Laterality: Left | Wound class: Clean

## 2017-09-10 MED ORDER — GABAPENTIN 400 MG PO CAPS: 400.0000 mg | ORAL_CAPSULE | Freq: Two times a day (BID) | ORAL | 3 refills | Status: DC

## 2017-09-10 MED ORDER — FENTANYL CITRATE (PF) 100 MCG/2ML IJ SOLN
INTRAMUSCULAR | Status: AC
  Filled 2017-09-10: qty 2

## 2017-09-10 MED ORDER — FAMOTIDINE 20 MG PO TABS
20.0000 mg | ORAL_TABLET | Freq: Once | ORAL | Status: AC
  Administered 2017-09-10: 20 mg via ORAL

## 2017-09-10 MED ORDER — OXYCODONE HCL 5 MG/5ML PO SOLN: 5.0000 mg | Freq: Once | ORAL | Status: DC | PRN

## 2017-09-10 MED ORDER — ONDANSETRON HCL 4 MG/2ML IJ SOLN
INTRAMUSCULAR | Status: DC | PRN
  Administered 2017-09-10: 4 mg via INTRAVENOUS

## 2017-09-10 MED ORDER — LACTATED RINGERS IV SOLN
INTRAVENOUS | Status: DC
  Administered 2017-09-10: 1000 mL via INTRAVENOUS
  Administered 2017-09-10: 08:00:00 via INTRAVENOUS

## 2017-09-10 MED ORDER — MELOXICAM 15 MG PO TABS: 15.0000 mg | ORAL_TABLET | Freq: Every day | ORAL | 2 refills | Status: DC

## 2017-09-10 MED ORDER — ACETAMINOPHEN 10 MG/ML IV SOLN
INTRAVENOUS | Status: AC
  Filled 2017-09-10: qty 100

## 2017-09-10 MED ORDER — PHENYLEPHRINE HCL 10 MG/ML IJ SOLN
INTRAMUSCULAR | Status: DC | PRN
  Administered 2017-09-10: 200 ug via INTRAVENOUS
  Administered 2017-09-10 (×3): 100 ug via INTRAVENOUS

## 2017-09-10 MED ORDER — DEXAMETHASONE SODIUM PHOSPHATE 10 MG/ML IJ SOLN
INTRAMUSCULAR | Status: AC
  Filled 2017-09-10: qty 1

## 2017-09-10 MED ORDER — ONDANSETRON HCL 4 MG/2ML IJ SOLN
INTRAMUSCULAR | Status: AC
  Filled 2017-09-10: qty 2

## 2017-09-10 MED ORDER — DEXAMETHASONE SODIUM PHOSPHATE 10 MG/ML IJ SOLN
INTRAMUSCULAR | Status: DC | PRN
  Administered 2017-09-10: 10 mg via INTRAVENOUS

## 2017-09-10 MED ORDER — PROMETHAZINE HCL 25 MG/ML IJ SOLN: 6.2500 mg | INTRAMUSCULAR | Status: DC | PRN

## 2017-09-10 MED ORDER — BUPIVACAINE HCL (PF) 0.5 % IJ SOLN
INTRAMUSCULAR | Status: DC | PRN
  Administered 2017-09-10: 30 mL

## 2017-09-10 MED ORDER — MIDAZOLAM HCL 2 MG/2ML IJ SOLN
INTRAMUSCULAR | Status: DC | PRN
  Administered 2017-09-10: 2 mg via INTRAVENOUS

## 2017-09-10 MED ORDER — GABAPENTIN 400 MG PO CAPS
400.0000 mg | ORAL_CAPSULE | ORAL | Status: AC
  Administered 2017-09-10: 400 mg via ORAL

## 2017-09-10 MED ORDER — CHLORHEXIDINE GLUCONATE CLOTH 2 % EX PADS: 6.0000 | MEDICATED_PAD | Freq: Once | CUTANEOUS | Status: DC

## 2017-09-10 MED ORDER — MEPERIDINE HCL 50 MG/ML IJ SOLN: 6.2500 mg | INTRAMUSCULAR | Status: DC | PRN

## 2017-09-10 MED ORDER — PROPOFOL 10 MG/ML IV BOLUS
INTRAVENOUS | Status: AC
  Filled 2017-09-10: qty 20

## 2017-09-10 MED ORDER — BUPIVACAINE HCL (PF) 0.5 % IJ SOLN
INTRAMUSCULAR | Status: AC
  Filled 2017-09-10: qty 30

## 2017-09-10 MED ORDER — LIDOCAINE HCL (PF) 2 % IJ SOLN
INTRAMUSCULAR | Status: AC
  Filled 2017-09-10: qty 10

## 2017-09-10 MED ORDER — OXYCODONE HCL 5 MG PO TABS: 5.0000 mg | ORAL_TABLET | Freq: Once | ORAL | Status: DC | PRN

## 2017-09-10 MED ORDER — NEOMYCIN-POLYMYXIN B GU 40-200000 IR SOLN
Status: AC
  Filled 2017-09-10: qty 2

## 2017-09-10 MED ORDER — PROPOFOL 10 MG/ML IV BOLUS
INTRAVENOUS | Status: DC | PRN
  Administered 2017-09-10: 100 mg via INTRAVENOUS

## 2017-09-10 MED ORDER — NEOMYCIN-POLYMYXIN B GU 40-200000 IR SOLN
Status: DC | PRN
  Administered 2017-09-10: 2 mL

## 2017-09-10 MED ORDER — GLYCOPYRROLATE 0.2 MG/ML IJ SOLN
INTRAMUSCULAR | Status: AC
  Filled 2017-09-10: qty 1

## 2017-09-10 MED ORDER — MIDAZOLAM HCL 2 MG/2ML IJ SOLN
INTRAMUSCULAR | Status: AC
  Filled 2017-09-10: qty 2

## 2017-09-10 MED ORDER — FENTANYL CITRATE (PF) 100 MCG/2ML IJ SOLN: 25.0000 ug | INTRAMUSCULAR | Status: DC | PRN

## 2017-09-10 MED ORDER — FENTANYL CITRATE (PF) 100 MCG/2ML IJ SOLN
INTRAMUSCULAR | Status: DC | PRN
  Administered 2017-09-10: 25 ug via INTRAVENOUS

## 2017-09-10 MED ORDER — HYDROCODONE-ACETAMINOPHEN 5-325 MG PO TABS: 1.0000 | ORAL_TABLET | Freq: Four times a day (QID) | ORAL | 0 refills | Status: DC | PRN

## 2017-09-10 MED ORDER — LIDOCAINE HCL (CARDIAC) 20 MG/ML IV SOLN
INTRAVENOUS | Status: DC | PRN
  Administered 2017-09-10: 70 mg via INTRAVENOUS

## 2017-09-10 MED ORDER — GLYCOPYRROLATE 0.2 MG/ML IJ SOLN
INTRAMUSCULAR | Status: DC | PRN
  Administered 2017-09-10: 0.2 mg via INTRAVENOUS

## 2017-09-10 MED ORDER — PHENYLEPHRINE HCL 10 MG/ML IJ SOLN
INTRAMUSCULAR | Status: AC
  Filled 2017-09-10: qty 1

## 2017-09-10 MED ORDER — CELECOXIB 200 MG PO CAPS
200.0000 mg | ORAL_CAPSULE | ORAL | Status: AC
  Administered 2017-09-10: 200 mg via ORAL

## 2017-09-10 MED ORDER — ACETAMINOPHEN 10 MG/ML IV SOLN
INTRAVENOUS | Status: DC | PRN
  Administered 2017-09-10: 1000 mg via INTRAVENOUS

## 2017-09-10 SURGICAL SUPPLY — 44 items
BLADE OSC/SAGITTAL MD 5.5X18 (BLADE) ×3 IMPLANT
BLADE SURG MINI STRL (BLADE) ×3 IMPLANT
BNDG ESMARK 4X12 TAN STRL LF (GAUZE/BANDAGES/DRESSINGS) ×3 IMPLANT
CANISTER SUCT 1200ML W/VALVE (MISCELLANEOUS) ×3 IMPLANT
CHLORAPREP W/TINT 26ML (MISCELLANEOUS) ×3 IMPLANT
CUFF TOURN 18 STER (MISCELLANEOUS) ×3 IMPLANT
DECANTER SPIKE VIAL GLASS SM (MISCELLANEOUS) ×3 IMPLANT
DRAPE FLUOR MINI C-ARM 54X84 (DRAPES) ×3 IMPLANT
ELECT REM PT RETURN 9FT ADLT (ELECTROSURGICAL) ×3
ELECTRODE REM PT RTRN 9FT ADLT (ELECTROSURGICAL) ×1 IMPLANT
GAUZE FLUFF 18X24 1PLY STRL (GAUZE/BANDAGES/DRESSINGS) ×6 IMPLANT
GAUZE PETRO XEROFOAM 1X8 (MISCELLANEOUS) ×3 IMPLANT
GLOVE BIO SURGEON STRL SZ7.5 (GLOVE) ×3 IMPLANT
GLOVE BIO SURGEON STRL SZ8 (GLOVE) ×3 IMPLANT
GOWN STRL REUS W/ TWL LRG LVL3 (GOWN DISPOSABLE) ×1 IMPLANT
GOWN STRL REUS W/TWL LRG LVL3 (GOWN DISPOSABLE) ×2
GOWN STRL REUS W/TWL LRG LVL4 (GOWN DISPOSABLE) ×3 IMPLANT
KIT RM TURNOVER STRD PROC AR (KITS) ×3 IMPLANT
LOOP VESSEL MINI 0.8X406 BLUE (MISCELLANEOUS) ×1 IMPLANT
LOOPS BLUE MINI 0.8X406MM (MISCELLANEOUS) ×2
NEEDLE FILTER BLUNT 18X 1/2SAF (NEEDLE) ×2
NEEDLE FILTER BLUNT 18X1 1/2 (NEEDLE) ×1 IMPLANT
NS IRRIG 500ML POUR BTL (IV SOLUTION) ×3 IMPLANT
PACK EXTREMITY ARMC (MISCELLANEOUS) ×3 IMPLANT
PAD CAST CTTN 4X4 STRL (SOFTGOODS) ×1 IMPLANT
PADDING CAST COTTON 4X4 STRL (SOFTGOODS) ×2
PASSER SUT SWANSON 36MM LOOP (INSTRUMENTS) ×3 IMPLANT
SPLINT CAST 1 STEP 3X12 (MISCELLANEOUS) ×3 IMPLANT
SPONGE LAP 18X18 5 PK (GAUZE/BANDAGES/DRESSINGS) IMPLANT
STOCKINETTE BIAS CUT 4 980044 (GAUZE/BANDAGES/DRESSINGS) ×3 IMPLANT
STOCKINETTE STRL 4IN 9604848 (GAUZE/BANDAGES/DRESSINGS) ×3 IMPLANT
SUT ETHILON 4-0 (SUTURE) ×2
SUT ETHILON 4-0 FS2 18XMFL BLK (SUTURE) ×1
SUT ETHILON 5-0 FS-2 18 BLK (SUTURE) ×3 IMPLANT
SUT VIC AB 2-0 SH 27 (SUTURE) ×2
SUT VIC AB 2-0 SH 27XBRD (SUTURE) ×1 IMPLANT
SUT VIC AB 3-0 SH 27 (SUTURE) ×2
SUT VIC AB 3-0 SH 27X BRD (SUTURE) ×1 IMPLANT
SUT VIC AB 4-0 SH 27 (SUTURE) ×2
SUT VIC AB 4-0 SH 27XANBCTRL (SUTURE) ×1 IMPLANT
SUTURE ETHLN 4-0 FS2 18XMF BLK (SUTURE) ×1 IMPLANT
SYR 30ML LL (SYRINGE) IMPLANT
SYR 5ML LL (SYRINGE) ×3 IMPLANT
WIRE Z .062 C-WIRE SPADE TIP (WIRE) IMPLANT

## 2017-09-10 NOTE — Anesthesia Preprocedure Evaluation (Signed)
Anesthesia Evaluation  Patient identified by MRN, date of birth, ID band Patient awake    Reviewed: Allergy & Precautions, NPO status , Patient's Chart, lab work & pertinent test results  History of Anesthesia Complications Negative for: history of anesthetic complications  Airway Mallampati: I  TM Distance: >3 FB Neck ROM: Full    Dental no notable dental hx.    Pulmonary neg pulmonary ROS, neg sleep apnea, neg COPD,    breath sounds clear to auscultation- rhonchi (-) wheezing      Cardiovascular Exercise Tolerance: Good (-) hypertension(-) CAD, (-) Past MI and (-) Cardiac Stents  Rhythm:Regular Rate:Normal - Systolic murmurs and - Diastolic murmurs    Neuro/Psych  Headaches, PSYCHIATRIC DISORDERS Anxiety Depression    GI/Hepatic Neg liver ROS, GERD  ,  Endo/Other  neg diabetesHypothyroidism   Renal/GU negative Renal ROS     Musculoskeletal  (+) Arthritis ,   Abdominal (+) - obese,   Peds  Hematology negative hematology ROS (+)   Anesthesia Other Findings Past Medical History: No date: Arthritis No date: Cancer (HCC)     Comment:  SKIN CANCER-BASAL CELL No date: GERD (gastroesophageal reflux disease) No date: Hyperlipidemia No date: Hypothyroidism No date: Thyroid disease   Reproductive/Obstetrics                             Anesthesia Physical Anesthesia Plan  ASA: II  Anesthesia Plan: General   Post-op Pain Management:    Induction: Intravenous  PONV Risk Score and Plan: 2 and Dexamethasone and Ondansetron  Airway Management Planned: LMA  Additional Equipment:   Intra-op Plan:   Post-operative Plan:   Informed Consent: I have reviewed the patients History and Physical, chart, labs and discussed the procedure including the risks, benefits and alternatives for the proposed anesthesia with the patient or authorized representative who has indicated his/her understanding  and acceptance.   Dental advisory given  Plan Discussed with: CRNA and Anesthesiologist  Anesthesia Plan Comments:         Anesthesia Quick Evaluation

## 2017-09-10 NOTE — Op Note (Signed)
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    The risks benefits and alternatives were discussed with the patient preoperatively including but not limited to the risks of infection, bleeding, nerve injury, cardiopulmonary complications, the need for revision surgery, among others, and the patient was willing to proceed.  EBL: None  TOURNIQUET TIME: 75  MIN  OPERATIVE IMPLANTS: None  OPERATIVE FINDINGS: Advanced arthritis left  thumb cmc joint  OPERATIVE PROCEDURE: The patient was brought to the operating room and underwent satisfactory general anesthesia in the supine position. The operative arm was prepped and draped in a sterile fashion. The patient was noted to have an excellent palmaris longus tendon. 3 short transverse incisions were made over the course of the tendon and it was harvested under direct vision. It was then placed in a moist sponge on the back table. An S-shaped incision was then made over the base of the thumb metacarpal dorsally. Dissection was carried out carefully under loupe magnification, carefully sparing the sensory nerves. The tendon sheath around the abductor pollicis longus and extensor pollicis brevis was released under direct vision, including over the radial styloid. Nerves were carefully spared. Retractors were inserted and the capsule of the trapezium and metacarpal were opened longitudinally. The radial artery and veins were carefully dissected free and retracted  with a vessel loop drain. The capsule was dissected off the trapezium and the trapezium was then morselized with the oscillating saw and rongeur. The flexor carpi radialis tendon was freed up in the base of the wound. The palmaris longus tendon was passed beneath this. A 3.5 mm drill was used to create a tunnel through the base of the metacarpal. The tendon was then passed up through the metacarpal using a tendon passer. The thumb had been placed in traction which was then released. The tendon was tied upon itself and the knot was sutured to itself prevent slippage. It was then tied in multiple knots and sutured into a ball. This was then rotated into the space between the metacarpal and the scaphoid. The capsule was then carefully and thoroughly, closed with 2-0 Vicryl suture. After irrigation, the skin was closed with 5-0 nylon. The forearm wounds had been closed with a similar suture. A well-padded thumb spica splint was applied. Tourniquet was deflated with good return of blood flow to the hand. Sponge and needle counts were correct. Patient was taken to recovery in good condition.  Park Breed, MD

## 2017-09-10 NOTE — H&P (Signed)
THE PATIENT WAS SEEN PRIOR TO SURGERY TODAY.  HISTORY, ALLERGIES, HOME MEDICATIONS AND OPERATIVE PROCEDURE WERE REVIEWED. RISKS AND BENEFITS OF SURGERY DISCUSSED WITH PATIENT AGAIN.  NO CHANGES FROM INITIAL HISTORY AND PHYSICAL NOTED.    

## 2017-09-10 NOTE — Discharge Instructions (Signed)
  AMBULATORY SURGERY  DISCHARGE INSTRUCTIONS   1) The drugs that you were given will stay in your system until tomorrow so for the next 24 hours you should not:  A) Drive an automobile B) Make any legal decisions C) Drink any alcoholic beverage   2) You may resume regular meals tomorrow.  Today it is better to start with liquids and gradually work up to solid foods.  You may eat anything you prefer, but it is better to start with liquids, then soup and crackers, and gradually work up to solid foods.   3) Please notify your doctor immediately if you have any unusual bleeding, trouble breathing, redness and pain at the surgery site, drainage, fever, or pain not relieved by medication.    4) Additional Instructions: TAKE A STOOL SOFTENER TWICE A DAY WHILE TAKING NARCOTIC PAIN MEDICINE TO PREVENT CONSTIPATION   Please contact your physician with any problems or Same Day Surgery at [phone], Monday through Friday 6 am to 4 pm, or [place] [facility] [place] [facility] number at [phone].   

## 2017-09-10 NOTE — Anesthesia Postprocedure Evaluation (Signed)
Anesthesia Post Note  Patient: Samantha Hanna  Procedure(s) Performed: CARPOMETACARPAL ([place]) FUSION OF THUMB (Left Thumb)  Patient location during evaluation: PACU Anesthesia Type: General Level of consciousness: awake and alert and oriented Pain management: pain level controlled Vital Signs Assessment: post-procedure vital signs reviewed and stable Respiratory status: spontaneous breathing, nonlabored ventilation and respiratory function stable Cardiovascular status: blood pressure returned to baseline and stable Postop Assessment: no signs of nausea or vomiting Anesthetic complications: no     Last Vitals:  Vitals:   09/10/17 1045 09/10/17 1119  BP: (!) 157/75 (!) 172/93  Pulse: (!) 54 (!) 55  Resp: 16   Temp: (!) 36.1 C   SpO2: 100% 97%    Last Pain:  Vitals:   09/10/17 1045  TempSrc: Temporal  PainSc:                  Jamese Trauger

## 2017-09-10 NOTE — Transfer of Care (Signed)
Immediate Anesthesia Transfer of Care Note  Patient: Samantha Hanna  Procedure(s) Performed: Procedure(s): CARPOMETACARPAL ([place]) FUSION OF THUMB (Left)  Patient Location: PACU  Anesthesia Type:General  Level of Consciousness: sedated  Airway & Oxygen Therapy: Patient Spontanous Breathing and Patient connected to face mask oxygen  Post-op Assessment: Report given to RN and Post -op Vital signs reviewed and stable  Post vital signs: Reviewed and stable  Last Vitals:  Vitals:   09/10/17 0625 09/10/17 0940  BP: 136/79 128/83  Pulse: 78 (!) 49  Resp: 14 11  Temp: 36.5 C (!) 36.3 C  SpO2: 73% 578%    Complications: No apparent anesthesia complications

## 2017-09-10 NOTE — Anesthesia Procedure Notes (Signed)
Procedure Name: LMA Insertion Date/Time: 09/10/2017 7:47 AM Performed by: Silvana Newness, CRNA Pre-anesthesia Checklist: Patient identified, Emergency Drugs available, Suction available, Patient being monitored and Timeout performed Patient Re-evaluated:Patient Re-evaluated prior to induction Oxygen Delivery Method: Circle system utilized Preoxygenation: Pre-oxygenation with 100% oxygen Induction Type: IV induction Ventilation: Mask ventilation without difficulty LMA: LMA inserted LMA Size: 4.0 Number of attempts: 1 Placement Confirmation: positive ETCO2 and breath sounds checked- equal and bilateral Tube secured with: Tape Dental Injury: Teeth and Oropharynx as per pre-operative assessment

## 2017-09-10 NOTE — Anesthesia Post-op Follow-up Note (Signed)
Anesthesia QCDR form completed.        

## 2017-09-11 ENCOUNTER — Encounter: Payer: Self-pay | Admitting: Specialist

## 2017-09-12 ENCOUNTER — Other Ambulatory Visit: Payer: Self-pay | Admitting: Internal Medicine

## 2017-09-12 DIAGNOSIS — R4182 Altered mental status, unspecified: Secondary | ICD-10-CM

## 2017-09-12 DIAGNOSIS — M1812 Unilateral primary osteoarthritis of first carpometacarpal joint, left hand: Secondary | ICD-10-CM | POA: Diagnosis not present

## 2017-09-12 NOTE — Assessment & Plan Note (Signed)
I am recommending that she have assistance at home post operatively to manage medications and monitor for any mental status changes that may occur as a result of the anesthesia or pain medications.  HH referral in process.

## 2017-09-12 NOTE — Assessment & Plan Note (Signed)
" >>  ASSESSMENT AND PLAN FOR VASCULAR DEMENTIA (HCC) WRITTEN ON 09/12/2017  5:14 PM BY Nicholus Chandran L, MD  I am recommending that she have assistance at home post operatively to manage medications and monitor for any mental status changes that may occur as a result of the anesthesia or pain medications.  HH referral in process.  "

## 2017-09-16 ENCOUNTER — Ambulatory Visit (INDEPENDENT_AMBULATORY_CARE_PROVIDER_SITE_OTHER): Payer: Medicare Other | Admitting: Internal Medicine

## 2017-09-16 ENCOUNTER — Encounter: Payer: Self-pay | Admitting: Internal Medicine

## 2017-09-16 DIAGNOSIS — T50905A Adverse effect of unspecified drugs, medicaments and biological substances, initial encounter: Secondary | ICD-10-CM

## 2017-09-16 DIAGNOSIS — F19921 Other psychoactive substance use, unspecified with intoxication with delirium: Secondary | ICD-10-CM | POA: Diagnosis not present

## 2017-09-16 NOTE — Patient Instructions (Signed)
  I'm glad you are feeling better!  A couple of helpful options:  A plastic chair that "straddles" your bathtub wall and allows you to pivot, or just sit (medical supply store)   Ready made high protein drinks that you can refrigerate and drink when you are not hungry enough to eat a meal:  [employer] Protein and Muscle Milk  Are great choices for protein shakes    We will cancel the Home Health services for now

## 2017-09-16 NOTE — Progress Notes (Signed)
Subjective:  Patient ID: Samantha Hanna, female    DOB: 1940/12/13  Age: 77 y.o. MRN: [id number]  CC: The encounter diagnosis was Delirium, drug-induced ([place]).  HPI MONNIE GUDGEL presents for follow up on recent wrist surgery and mental status changes that family (daugher) had observed after patient was discharged home.  Patient was hallucinating , forgetting to take medications and unable to manage the household. Her daughters  Became concerned and  requested evaluation for dementia and concerned about mother needing supervision at home.   Patient has  stopped taking vicodin and  The mental status changed have resolved.  Her pain is controlled on ibuprofen and tylenol alone .  She feels steady on her feet,  Has adequate support from daughters and neighbors and does not want or need paid caregiver at this time  Outpatient Medications Prior to Visit  Medication Sig Dispense Refill  . acetaminophen (TYLENOL) 325 MG tablet Take 325 mg by mouth every 6 (six) hours as needed for mild pain or headache.    . ALPRAZolam (XANAX) 0.25 MG tablet TAKE HALF TABLET BY MOUTH AS NEEDED FOR SLEEP-PT NOT CURRENTLY TAKING    . cholecalciferol (VITAMIN D) 1000 UNITS tablet Take 1,000 Units by mouth daily.      . cyanocobalamin (,VITAMIN B-12,) 1000 MCG/ML injection INJECT 1 ML INTO THE MUSCLE EVERY 14 DAYS. FOR DAILY/WEEKLY/MONTHLY PER MD DIRECTIONS FOR B12 DEFICENCY. 10 mL 11  . cyclobenzaprine (FLEXERIL) 10 MG tablet Take 10 mg by mouth daily as needed for muscle spasms.    . fluticasone (FLONASE) 50 MCG/ACT nasal spray Place 1 spray into both nostrils daily as needed for allergies or rhinitis.     . folic acid (FOLVITE) 798 MCG tablet Take 400 mcg by mouth daily.    . meloxicam (MOBIC) 15 MG tablet Take 1 tablet (15 mg total) by mouth daily. 30 tablet 2  . Multiple Vitamin (MULTIVITAMIN WITH MINERALS) TABS tablet Take 1 tablet by mouth daily.    . Omega-3 Fatty Acids (OMEGA 3 PO) Take 30 mLs by mouth daily. 800  mg of Fish Oil  per serving    . pantoprazole (PROTONIX) 40 MG tablet Take 1 tablet (40 mg total) by mouth daily. 90 tablet 1  . Polyvinyl Alcohol-Povidone (REFRESH OP) Apply 1 drop to eye daily as needed (dry eyes).    . SYNTHROID 75 MCG tablet TAKE 1 TABLET BY MOUTH DAILY ON AN EMPTYSTOMACH WITH A GLASS OF WATERAT LEAST 30-60 MINUTES BEFORE BREAKFAST 90 tablet 2  . gabapentin (NEURONTIN) 400 MG capsule Take 1 capsule (400 mg total) by mouth 2 (two) times daily. (Patient not taking: Reported on 09/16/2017) 60 capsule 3  . HYDROcodone-acetaminophen (NORCO) 5-325 MG tablet Take 1 tablet by mouth every 6 (six) hours as needed. (Patient not taking: Reported on 09/16/2017) 40 tablet 0  . mirtazapine (REMERON) 7.5 MG tablet Take 1 tablet (7.5 mg total) by mouth at bedtime. (Patient not taking: Reported on 09/16/2017) 90 tablet 1   No facility-administered medications prior to visit.     Review of Systems;  Patient denies headache, fevers, malaise, unintentional weight loss, skin rash, eye pain, sinus congestion and sinus pain, sore throat, dysphagia,  hemoptysis , cough, dyspnea, wheezing, chest pain, palpitations, orthopnea, edema, abdominal pain, nausea, melena, diarrhea, constipation, flank pain, dysuria, hematuria, urinary  Frequency, nocturia, numbness, tingling, seizures,  Focal weakness, Loss of consciousness,  Tremor, insomnia, depression, anxiety, and suicidal ideation.      Objective:  BP 122/74 (  BP Location: Left Arm, Patient Position: Sitting, Cuff Size: Normal)   Pulse 73   Temp 97.6 F (36.4 C) (Oral)   Resp 15   Ht 5' 6.75" (1.695 m)   Wt 149 lb 3.2 oz (67.7 kg)   SpO2 98%   BMI 23.54 kg/m   BP Readings from Last 3 Encounters:  09/16/17 122/74  09/10/17 (!) 172/93  09/08/17 118/76    Wt Readings from Last 3 Encounters:  09/16/17 149 lb 3.2 oz (67.7 kg)  09/10/17 142 lb (64.4 kg)  09/08/17 146 lb 9.6 oz (66.5 kg)    General appearance: alert, cooperative and appears  stated age Ears: normal TM's and external ear canals both ears Throat: lips, mucosa, and tongue normal; teeth and gums normal Neck: no adenopathy, no carotid bruit, supple, symmetrical, trachea midline and thyroid not enlarged, symmetric, no tenderness/mass/nodules Back: symmetric, no curvature. ROM normal. No CVA tenderness. Lungs: clear to auscultation bilaterally Heart: regular rate and rhythm, S1, S2 normal, no murmur, click, rub or gallop Abdomen: soft, non-tender; bowel sounds normal; no masses,  no organomegaly Pulses: 2+ and symmetric Skin: Skin color, texture, turgor normal. No rashes or lesions Lymph nodes: Cervical, supraclavicular, and axillary nodes normal. Neuro:  awake and interactive with normal mood and affect. Higher cortical functions are normal. Speech is clear without word-finding difficulty or dysarthria. Extraocular movements are intact. Visual fields of both eyes are grossly intact. Sensation to light touch is grossly intact bilaterally of upper and lower extremities. Motor examination shows 4+/5 symmetric hand grip and upper extremity and 5/5 lower extremity strength. There is no pronation or drift. Gait is non-ataxic    No results found for: HGBA1C  Lab Results  Component Value Date   CREATININE 0.70 09/04/2017   CREATININE 0.75 04/14/2017   CREATININE 0.74 09/23/2016    Lab Results  Component Value Date   WBC 5.2 09/04/2017   HGB 14.0 09/04/2017   HCT 42.3 09/04/2017   PLT 220.0 09/04/2017   GLUCOSE 99 09/04/2017   CHOL 263 (H) 04/14/2017   TRIG 93.0 04/14/2017   HDL 59.10 04/14/2017   LDLDIRECT 172.6 09/08/2013   LDLCALC 185 (H) 04/14/2017   ALT 16 09/04/2017   AST 17 09/04/2017   NA 142 09/04/2017   K 4.1 09/04/2017   CL 106 09/04/2017   CREATININE 0.70 09/04/2017   BUN 15 09/04/2017   CO2 30 09/04/2017   TSH 0.92 04/14/2017    No results found.  Assessment & Plan:   Problem List Items Addressed This Visit    Delirium, drug-induced  ([place])    Secondary to vicodin,  Now resolved. She is alert and fully  oriented ,  All previously reported signs of cognitive decline have resolved.         A total of 25 minutes of face to face time was spent with patient more than half of which was spent in counselling about the above mentioned conditions  and coordination of care  I have discontinued Lashica B. Baskin's mirtazapine, HYDROcodone-acetaminophen, and gabapentin. I am also having her maintain her cholecalciferol, fluticasone, pantoprazole, ALPRAZolam, SYNTHROID, cyanocobalamin, cyclobenzaprine, folic acid, multivitamin with minerals, Omega-3 Fatty Acids (OMEGA 3 PO), acetaminophen, Polyvinyl Alcohol-Povidone (REFRESH OP), and meloxicam.  No orders of the defined types were placed in this encounter.   Medications Discontinued During This Encounter  Medication Reason  . HYDROcodone-acetaminophen (NORCO) 5-325 MG tablet Patient Preference  . mirtazapine (REMERON) 7.5 MG tablet Patient has not taken in last 30 days  .  gabapentin (NEURONTIN) 400 MG capsule Patient Preference    Follow-up: Return in about 3 months (around 12/15/2017).   Crecencio Mc, MD

## 2017-09-17 DIAGNOSIS — F19921 Other psychoactive substance use, unspecified with intoxication with delirium: Principal | ICD-10-CM

## 2017-09-17 DIAGNOSIS — R410 Disorientation, unspecified: Secondary | ICD-10-CM | POA: Insufficient documentation

## 2017-09-17 NOTE — Assessment & Plan Note (Addendum)
Secondary to vicodin,  Now resolved. She is alert and fully  oriented ,  All previously reported signs of cognitive decline have resolved.

## 2017-09-18 ENCOUNTER — Ambulatory Visit: Payer: Medicare Other

## 2017-10-11 ENCOUNTER — Other Ambulatory Visit: Payer: Self-pay | Admitting: Internal Medicine

## 2017-10-16 DIAGNOSIS — M1812 Unilateral primary osteoarthritis of first carpometacarpal joint, left hand: Secondary | ICD-10-CM | POA: Diagnosis not present

## 2017-10-21 DIAGNOSIS — M1811 Unilateral primary osteoarthritis of first carpometacarpal joint, right hand: Secondary | ICD-10-CM | POA: Diagnosis not present

## 2017-10-28 DIAGNOSIS — M25612 Stiffness of left shoulder, not elsewhere classified: Secondary | ICD-10-CM | POA: Diagnosis not present

## 2017-10-28 DIAGNOSIS — M1811 Unilateral primary osteoarthritis of first carpometacarpal joint, right hand: Secondary | ICD-10-CM | POA: Diagnosis not present

## 2017-10-28 DIAGNOSIS — M25512 Pain in left shoulder: Secondary | ICD-10-CM | POA: Diagnosis not present

## 2017-11-03 DIAGNOSIS — M25612 Stiffness of left shoulder, not elsewhere classified: Secondary | ICD-10-CM | POA: Diagnosis not present

## 2017-11-03 DIAGNOSIS — M25512 Pain in left shoulder: Secondary | ICD-10-CM | POA: Diagnosis not present

## 2017-11-04 DIAGNOSIS — M1811 Unilateral primary osteoarthritis of first carpometacarpal joint, right hand: Secondary | ICD-10-CM | POA: Diagnosis not present

## 2017-11-11 DIAGNOSIS — M1811 Unilateral primary osteoarthritis of first carpometacarpal joint, right hand: Secondary | ICD-10-CM | POA: Diagnosis not present

## 2017-11-11 DIAGNOSIS — M25612 Stiffness of left shoulder, not elsewhere classified: Secondary | ICD-10-CM | POA: Diagnosis not present

## 2017-11-11 DIAGNOSIS — M25512 Pain in left shoulder: Secondary | ICD-10-CM | POA: Diagnosis not present

## 2017-11-18 DIAGNOSIS — M25512 Pain in left shoulder: Secondary | ICD-10-CM | POA: Diagnosis not present

## 2017-11-18 DIAGNOSIS — M25612 Stiffness of left shoulder, not elsewhere classified: Secondary | ICD-10-CM | POA: Diagnosis not present

## 2017-11-18 DIAGNOSIS — M1811 Unilateral primary osteoarthritis of first carpometacarpal joint, right hand: Secondary | ICD-10-CM | POA: Diagnosis not present

## 2017-11-25 DIAGNOSIS — M25612 Stiffness of left shoulder, not elsewhere classified: Secondary | ICD-10-CM | POA: Diagnosis not present

## 2017-11-25 DIAGNOSIS — M1811 Unilateral primary osteoarthritis of first carpometacarpal joint, right hand: Secondary | ICD-10-CM | POA: Diagnosis not present

## 2017-11-25 DIAGNOSIS — M25512 Pain in left shoulder: Secondary | ICD-10-CM | POA: Diagnosis not present

## 2017-12-02 ENCOUNTER — Ambulatory Visit: Payer: Self-pay | Admitting: *Deleted

## 2017-12-02 ENCOUNTER — Telehealth: Payer: Self-pay | Admitting: Internal Medicine

## 2017-12-02 MED ORDER — PREDNISONE 10 MG PO TABS: ORAL_TABLET | ORAL | 0 refills | Status: DC

## 2017-12-02 MED ORDER — LEVOFLOXACIN 500 MG PO TABS: 500.0000 mg | ORAL_TABLET | Freq: Every day | ORAL | 0 refills | Status: DC

## 2017-12-02 NOTE — Telephone Encounter (Signed)
Pt   Prefers   To  Have  An   Appointment   Because  The  Pain  Was  Severe  When  She  Woke  Up this  Am . She  Has  Stuffy  Nose  .   Temp  98 this  Am .   No  Availability  With   [place] this  Week  Appointment  Made  For tomorrow with Allie Bossier    Reason for Disposition . [1] Sinus congestion (pressure, fullness) AND [2] present > 10 days  Answer Assessment - Initial Assessment Questions 1. LOCATION: "Where does it hurt?"      Below  Eyes   Top of  Nose    2. ONSET: "When did the sinus pain start?"  (e.g., hours, days)        3  Hours   Ago    3. SEVERITY: "How bad is the pain?"   (Scale 1-10; mild, moderate or severe)   - MILD (1-3): doesn't interfere with normal activities    - MODERATE (4-7): interferes with normal activities (e.g., work or school) or awakens from sleep   - SEVERE (8-10): excruciating pain and patient unable to do any normal activities         Moderate   4. RECURRENT SYMPTOM: "Have you ever had sinus problems before?" If so, ask: "When was the last time?" and "What happened that time?"        Off  And  On    - Last  Episode    Couple  Of  Years    5. NASAL CONGESTION: "Is the nose blocked?" If so, ask, "Can you open it or must you breathe through the mouth?"       Stuffy    -    Was  Able  To  Blow  Her  Noses  Symptoms    6. NASAL DISCHARGE: "Do you have discharge from your nose?" If so ask, "What color?"        No   7. FEVER: "Do you have a fever?" If so, ask: "What is it, how was it measured, and when did it start?"          No   8. OTHER SYMPTOMS: "Do you have any other symptoms?" (e.g., sore throat, cough, earache, difficulty breathing)          no  9. PREGNANCY: "Is there any chance you are pregnant?" "When was your last menstrual period?"       n/a  Protocols used: SINUS PAIN OR CONGESTION-A-AH

## 2017-12-02 NOTE — Telephone Encounter (Signed)
Patient's husband,  The retired Dr Deon Pilling,  Paid me a call at home this morning regarding patient's acute sinusitis.  Please let her know that based on his history, I do not need to see her , but will treat her with levaquin and a prednisone taper, and please schedule her a quick follow up (15 min)  On Friday to make sure she is improving.  She should also use Afrin nasal spray every 12 hours,  And start taking allegra OTC 180 mg daily  Along with a probiotic for 3 weeks

## 2017-12-02 NOTE — Telephone Encounter (Signed)
FYI , patient notified and has been scheduled for Friday at 4.

## 2017-12-03 ENCOUNTER — Ambulatory Visit: Payer: Medicare Other | Admitting: Primary Care

## 2017-12-05 ENCOUNTER — Other Ambulatory Visit: Payer: Self-pay

## 2017-12-05 ENCOUNTER — Encounter: Payer: Self-pay | Admitting: Internal Medicine

## 2017-12-05 ENCOUNTER — Ambulatory Visit (INDEPENDENT_AMBULATORY_CARE_PROVIDER_SITE_OTHER): Payer: Medicare Other | Admitting: Internal Medicine

## 2017-12-05 DIAGNOSIS — J0100 Acute maxillary sinusitis, unspecified: Secondary | ICD-10-CM

## 2017-12-05 NOTE — Patient Instructions (Addendum)
Continue Levaquin and prednisone  Until gone   Add Sudafed PE  10 to 30 mg every 6 hours if needed for headache  Due to congestion  Ok to add tylenol up to 1000 meg every 12 hours of 500 mg every 6 hours   contineuee saline irrigations

## 2017-12-05 NOTE — Progress Notes (Signed)
Subjective:  Patient ID: Samantha Hanna, female    DOB: 1941-02-02  Age: 77 y.o. MRN: [id number]  CC: The encounter diagnosis was Acute non-recurrent maxillary sinusitis.  HPI Samantha Hanna presents for follow up on empiric treatment for sinusitis which she started on Tuesday with Levaquin and a 6 day prednisone taper.  Patient woke up on Tuesday  with severe maxillary sinus pain, /headache and purulent drainage.  She was unable to schedule a visit, and her husband , the retired ophthalmologist Dr Deon Pilling,  Dropped by my house to report his concern about her illness.   Symptoms started to improve on Thursday since starting levaquin and prednisone.  Still has some sinus pressure but much less severe,  And her rhinorrhea has become less viscous and less green. No cough or fevers  No ear pain ,  No diarrhea,  And she is taking a probiotic     Outpatient Medications Prior to Visit  Medication Sig Dispense Refill  . acetaminophen (TYLENOL) 325 MG tablet Take 325 mg by mouth every 6 (six) hours as needed for mild pain or headache.    . ALPRAZolam (XANAX) 0.25 MG tablet TAKE HALF TABLET BY MOUTH AS NEEDED FOR SLEEP-PT NOT CURRENTLY TAKING    . cholecalciferol (VITAMIN D) 1000 UNITS tablet Take 1,000 Units by mouth daily.      . cyanocobalamin (,VITAMIN B-12,) 1000 MCG/ML injection INJECT 1 ML INTO THE MUSCLE EVERY 14 DAYS. FOR DAILY/WEEKLY/MONTHLY PER MD DIRECTIONS FOR B12 DEFICENCY. 10 mL 11  . cyclobenzaprine (FLEXERIL) 10 MG tablet Take 10 mg by mouth daily as needed for muscle spasms.    . fluticasone (FLONASE) 50 MCG/ACT nasal spray Place 1 spray into both nostrils daily as needed for allergies or rhinitis.     . folic acid (FOLVITE) 132 MCG tablet Take 400 mcg by mouth daily.    Marland Kitchen levofloxacin (LEVAQUIN) 500 MG tablet Take 1 tablet (500 mg total) by mouth daily. 7 tablet 0  . meloxicam (MOBIC) 15 MG tablet Take 1 tablet (15 mg total) by mouth daily. 30 tablet 2  . Multiple Vitamin (MULTIVITAMIN  WITH MINERALS) TABS tablet Take 1 tablet by mouth daily.    . Omega-3 Fatty Acids (OMEGA 3 PO) Take 30 mLs by mouth daily. 800 mg of Fish Oil  per serving    . pantoprazole (PROTONIX) 40 MG tablet Take 1 tablet (40 mg total) by mouth daily. 90 tablet 1  . Polyvinyl Alcohol-Povidone (REFRESH OP) Apply 1 drop to eye daily as needed (dry eyes).    . predniSONE (DELTASONE) 10 MG tablet 6 tablets on Day 1 , then reduce by 1 tablet daily until gone 21 tablet 0  . SYNTHROID 75 MCG tablet TAKE ONE TABLET ON AN EMPTY STOMACH WITHA GLASS OF WATER AT LEAST 30 TO 60 MINUTES BEFORE BREAKFAST 90 tablet 0   No facility-administered medications prior to visit.     Review of Systems;  Patient denies headache, fevers, malaise, unintentional weight loss, skin rash, eye pain, sinus congestion and sinus pain, sore throat, dysphagia,  hemoptysis , cough, dyspnea, wheezing, chest pain, palpitations, orthopnea, edema, abdominal pain, nausea, melena, diarrhea, constipation, flank pain, dysuria, hematuria, urinary  Frequency, nocturia, numbness, tingling, seizures,  Focal weakness, Loss of consciousness,  Tremor, insomnia, depression, anxiety, and suicidal ideation.      Objective:  BP 124/78 (BP Location: Left Arm, Patient Position: Sitting, Cuff Size: Normal)   Pulse 82   Temp 98.1 F (36.7 C)  Wt 141 lb 6.4 oz (64.1 kg)   SpO2 98%   BMI 22.31 kg/m   BP Readings from Last 3 Encounters:  12/05/17 124/78  09/16/17 122/74  09/10/17 (!) 172/93    Wt Readings from Last 3 Encounters:  12/05/17 141 lb 6.4 oz (64.1 kg)  09/16/17 149 lb 3.2 oz (67.7 kg)  09/10/17 142 lb (64.4 kg)    General appearance: alert, cooperative and appears stated age Ears: normal TM's and external ear canals both ears Throat: lips, mucosa, and tongue normal; teeth and gums normal Neck: no adenopathy, no carotid bruit, supple, symmetrical, trachea midline and thyroid not enlarged, symmetric, no tenderness/mass/nodules Back:  symmetric, no curvature. ROM normal. No CVA tenderness. Lungs: clear to auscultation bilaterally Heart: regular rate and rhythm, S1, S2 normal, no murmur, click, rub or gallop Abdomen: soft, non-tender; bowel sounds normal; no masses,  no organomegaly Pulses: 2+ and symmetric Skin: Skin color, texture, turgor normal. No rashes or lesions Lymph nodes: Cervical, supraclavicular, and axillary nodes normal.  No results found for: HGBA1C  Lab Results  Component Value Date   CREATININE 0.70 09/04/2017   CREATININE 0.75 04/14/2017   CREATININE 0.74 09/23/2016    Lab Results  Component Value Date   WBC 5.2 09/04/2017   HGB 14.0 09/04/2017   HCT 42.3 09/04/2017   PLT 220.0 09/04/2017   GLUCOSE 99 09/04/2017   CHOL 263 (H) 04/14/2017   TRIG 93.0 04/14/2017   HDL 59.10 04/14/2017   LDLDIRECT 172.6 09/08/2013   LDLCALC 185 (H) 04/14/2017   ALT 16 09/04/2017   AST 17 09/04/2017   NA 142 09/04/2017   K 4.1 09/04/2017   CL 106 09/04/2017   CREATININE 0.70 09/04/2017   BUN 15 09/04/2017   CO2 30 09/04/2017   TSH 0.92 04/14/2017    No results found.  Assessment & Plan:   Problem List Items Addressed This Visit    Acute sinusitis    Improving with empiric treatment with levaquin (pateint has PCN allergy) and prednisone taper.          I am having [name] maintain her cholecalciferol, fluticasone, pantoprazole, ALPRAZolam, cyanocobalamin, cyclobenzaprine, folic acid, multivitamin with minerals, Omega-3 Fatty Acids (OMEGA 3 PO), acetaminophen, Polyvinyl Alcohol-Povidone (REFRESH OP), meloxicam, SYNTHROID, levofloxacin, and predniSONE.  No orders of the defined types were placed in this encounter.   There are no discontinued medications.  Follow-up: No follow-ups on file.   Crecencio Mc, MD

## 2017-12-07 DIAGNOSIS — J329 Chronic sinusitis, unspecified: Secondary | ICD-10-CM | POA: Insufficient documentation

## 2017-12-07 DIAGNOSIS — J019 Acute sinusitis, unspecified: Secondary | ICD-10-CM | POA: Insufficient documentation

## 2017-12-07 NOTE — Assessment & Plan Note (Signed)
Improving with empiric treatment with levaquin (pateint has PCN allergy) and prednisone taper.

## 2018-01-05 DIAGNOSIS — H353131 Nonexudative age-related macular degeneration, bilateral, early dry stage: Secondary | ICD-10-CM | POA: Diagnosis not present

## 2018-01-05 DIAGNOSIS — H2513 Age-related nuclear cataract, bilateral: Secondary | ICD-10-CM | POA: Diagnosis not present

## 2018-01-14 ENCOUNTER — Other Ambulatory Visit: Payer: Self-pay | Admitting: Internal Medicine

## 2018-01-15 NOTE — Telephone Encounter (Signed)
Prednisone and levofloxacin Filled: 12/02/2017 Last OV: 12/05/2017 Next OV: not scheduled  Attempted to call pt to find out what symptoms she is still having but no answer and no voicemail on both cell phone and home phone.

## 2018-01-30 ENCOUNTER — Other Ambulatory Visit: Payer: Self-pay | Admitting: Internal Medicine

## 2018-03-12 DIAGNOSIS — D2261 Melanocytic nevi of right upper limb, including shoulder: Secondary | ICD-10-CM | POA: Diagnosis not present

## 2018-03-12 DIAGNOSIS — D2271 Melanocytic nevi of right lower limb, including hip: Secondary | ICD-10-CM | POA: Diagnosis not present

## 2018-03-12 DIAGNOSIS — Z09 Encounter for follow-up examination after completed treatment for conditions other than malignant neoplasm: Secondary | ICD-10-CM | POA: Diagnosis not present

## 2018-03-12 DIAGNOSIS — D2272 Melanocytic nevi of left lower limb, including hip: Secondary | ICD-10-CM | POA: Diagnosis not present

## 2018-03-12 DIAGNOSIS — Z85828 Personal history of other malignant neoplasm of skin: Secondary | ICD-10-CM | POA: Diagnosis not present

## 2018-03-12 DIAGNOSIS — C44612 Basal cell carcinoma of skin of right upper limb, including shoulder: Secondary | ICD-10-CM | POA: Diagnosis not present

## 2018-03-12 DIAGNOSIS — D485 Neoplasm of uncertain behavior of skin: Secondary | ICD-10-CM | POA: Diagnosis not present

## 2018-03-12 DIAGNOSIS — Z08 Encounter for follow-up examination after completed treatment for malignant neoplasm: Secondary | ICD-10-CM | POA: Diagnosis not present

## 2018-03-12 DIAGNOSIS — D225 Melanocytic nevi of trunk: Secondary | ICD-10-CM | POA: Diagnosis not present

## 2018-03-12 DIAGNOSIS — Z872 Personal history of diseases of the skin and subcutaneous tissue: Secondary | ICD-10-CM | POA: Diagnosis not present

## 2018-03-12 DIAGNOSIS — D2262 Melanocytic nevi of left upper limb, including shoulder: Secondary | ICD-10-CM | POA: Diagnosis not present

## 2018-03-12 DIAGNOSIS — C44619 Basal cell carcinoma of skin of left upper limb, including shoulder: Secondary | ICD-10-CM | POA: Diagnosis not present

## 2018-03-12 DIAGNOSIS — Z85820 Personal history of malignant melanoma of skin: Secondary | ICD-10-CM | POA: Diagnosis not present

## 2018-03-23 ENCOUNTER — Ambulatory Visit (INDEPENDENT_AMBULATORY_CARE_PROVIDER_SITE_OTHER): Payer: Medicare Other

## 2018-03-23 VITALS — BP 110/70 | HR 72 | Temp 97.9°F | Resp 14 | Ht 67.0 in | Wt 135.0 lb

## 2018-03-23 DIAGNOSIS — Z0000 Encounter for general adult medical examination without abnormal findings: Secondary | ICD-10-CM

## 2018-03-23 NOTE — Patient Instructions (Addendum)
  Samantha Hanna , Thank you for taking time to come for your Medicare Wellness Visit. I appreciate your ongoing commitment to your health goals. Please review the following plan we discussed and let me know if I can assist you in the future.   These are the goals we discussed: Goals    . Increase physical activity     Walk for exercise Stationary bike for exercise       This is a list of the screening recommended for you and due dates:  Health Maintenance  Topic Date Due  . Colon Cancer Screening  08/26/2014  . Flu Shot  03/26/2018  . Mammogram  05/09/2019  . Tetanus Vaccine  07/14/2022  . DEXA scan (bone density measurement)  Completed  . Pneumonia vaccines  Completed

## 2018-03-23 NOTE — Progress Notes (Addendum)
Subjective:   Samantha Hanna is a 77 y.o. female who presents for Medicare Annual (Subsequent) preventive examination.  Review of Systems:  No ROS.  Medicare Wellness Visit. Additional risk factors are reflected in the social history.  Cardiac Risk Factors include: advanced age (>62men, >44 women)     Objective:     Vitals: BP 110/70 (BP Location: Left Arm, Patient Position: Sitting, Cuff Size: Normal)   Pulse 72   Temp 97.9 F (36.6 C) (Oral)   Resp 14   Ht 5\' 7"  (1.702 m)   Wt 135 lb (61.2 kg)   SpO2 97%   BMI 21.14 kg/m   Body mass index is 21.14 kg/m.  Advanced Directives 03/23/2018 09/08/2017 03/21/2017 03/21/2016  Does Patient Have a Medical Advance Directive? Yes Yes Yes Yes  Type of Paramedic of [place];Living will - [place];Living will [place];Living will  Does patient want to make changes to medical advance directive? No - Patient declined - No - Patient declined -  Copy of [place] in Chart? - - No - copy requested No - copy requested    Tobacco Social History   Tobacco Use  Smoking Status Never Smoker  Smokeless Tobacco Never Used     Counseling given: Not Answered   Clinical Intake:  Pre-visit preparation completed: Yes  Pain : No/denies pain     Nutritional Status: BMI of 19-24  Normal Diabetes: No  How often do you need to have someone help you when you read instructions, pamphlets, or other written materials from your doctor or pharmacy?: 1 - Never  Interpreter Needed?: No     Past Medical History:  Diagnosis Date  . Arthritis   . Cancer (HCC)    SKIN CANCER-BASAL CELL  . GERD (gastroesophageal reflux disease)   . Hyperlipidemia   . Hypothyroidism   . Thyroid disease    Past Surgical History:  Procedure Laterality Date  . APPENDECTOMY    . CARPOMETACARPAL ([place]) FUSION OF THUMB Left 09/10/2017   Procedure: CARPOMETACARPAL [facility]) FUSION OF THUMB;   Surgeon: Earnestine Leys, MD;  Location: [facility] ORS;  Service: Orthopedics;  Laterality: Left;  Marland Kitchen MELANOMA EXCISION     x 2, Dr. Sharlett Iles   Family History  Problem Relation Age of Onset  . Osteoporosis Mother        Deceased, 32  . Coronary artery disease Father 40       Deceased, 64 in MVA  . Heart Problems Father   . Arthritis Daughter 72       Rheumatiod  . Healthy Daughter    Social History   Socioeconomic History  . Marital status: Married    Spouse name: Not on file  . Number of children: Not on file  . Years of education: Not on file  . Highest education level: Not on file  Occupational History  . Not on file  Social Needs  . Financial resource strain: Not on file  . Food insecurity:    Worry: Not on file    Inability: Not on file  . Transportation needs:    Medical: Not on file    Non-medical: Not on file  Tobacco Use  . Smoking status: Never Smoker  . Smokeless tobacco: Never Used  Substance and Sexual Activity  . Alcohol use: Yes    Alcohol/week: 1.8 oz    Types: 3 Standard drinks or equivalent per week    Comment: social  .  Drug use: No  . Sexual activity: Yes  Lifestyle  . Physical activity:    Days per week: Not on file    Minutes per session: Not on file  . Stress: Not on file  Relationships  . Social connections:    Talks on phone: Not on file    Gets together: Not on file    Attends religious service: Not on file    Active member of club or organization: Not on file    Attends meetings of clubs or organizations: Not on file    Relationship status: Not on file  Other Topics Concern  . Not on file  Social History Narrative   She lives with husband (optometrist) who has Parkinson's disease.  They have two grown children.   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    Highest level of education:  Bachelor's    Outpatient Encounter Medications as of 03/23/2018  Medication Sig  . ALPRAZolam (XANAX) 0.25 MG  tablet TAKE HALF TABLET BY MOUTH AS NEEDED FOR SLEEP-PT NOT CURRENTLY TAKING  . cholecalciferol (VITAMIN D) 1000 UNITS tablet Take 1,000 Units by mouth daily.    . cyclobenzaprine (FLEXERIL) 10 MG tablet Take 10 mg by mouth daily as needed for muscle spasms.  . fluticasone (FLONASE) 50 MCG/ACT nasal spray Place 1 spray into both nostrils daily as needed for allergies or rhinitis.   Marland Kitchen levofloxacin (LEVAQUIN) 500 MG tablet TAKE ONE TABLET EVERY DAY  . Multiple Vitamin (MULTIVITAMIN WITH MINERALS) TABS tablet Take 1 tablet by mouth daily.  . pantoprazole (PROTONIX) 40 MG tablet Take 1 tablet (40 mg total) by mouth daily.  . Polyvinyl Alcohol-Povidone (REFRESH OP) Apply 1 drop to eye daily as needed (dry eyes).  . [DISCONTINUED] predniSONE (DELTASONE) 10 MG tablet TAKE 6 TABLETS ON DAY 1 THEN DECREASE BY1 TABLET EVERY DAY UNTIL GONE  . acetaminophen (TYLENOL) 325 MG tablet Take 325 mg by mouth every 6 (six) hours as needed for mild pain or headache.  . cyanocobalamin (,VITAMIN B-12,) 1000 MCG/ML injection INJECT 1 ML INTO THE MUSCLE EVERY 14 DAYS. FOR DAILY/WEEKLY/MONTHLY PER MD DIRECTIONS FOR B12 DEFICENCY. (Patient not taking: Reported on 03/23/2018)  . folic acid (FOLVITE) 932 MCG tablet Take 400 mcg by mouth daily.  . meloxicam (MOBIC) 15 MG tablet Take 1 tablet (15 mg total) by mouth daily. (Patient not taking: Reported on 03/23/2018)  . Omega-3 Fatty Acids (OMEGA 3 PO) Take 30 mLs by mouth daily. 800 mg of Fish Oil  per serving  . SYNTHROID 75 MCG tablet TAKE 1 TABLET EVERY DAY ON EMPTY STOMACHWITH A GLASS OF WATER AT LEAST 30-60 MINBEFORE BREAKFAST (Patient not taking: Reported on 03/23/2018)   No facility-administered encounter medications on file as of 03/23/2018.     Activities of Daily Living In your present state of health, do you have any difficulty performing the following activities: 03/23/2018 09/08/2017  Hearing? N N  Vision? N N  Difficulty concentrating or making decisions? N N    Walking or climbing stairs? N N  Dressing or bathing? N N  Doing errands, shopping? N N  Preparing Food and eating ? N -  Using the Toilet? N -  In the past six months, have you accidently leaked urine? N -  Do you have problems with loss of bowel control? N -  Managing your Medications? N -  Managing your Finances? N -  Housekeeping or managing your Housekeeping? N -  Some  recent data might be hidden    Patient Care Team: Crecencio Mc, MD as PCP - General (Internal Medicine)    Assessment:   This is a routine wellness examination for [facility].  The goal of the wellness visit is to assist the patient how to close the gaps in care and create a preventative care plan for the patient.   The roster of all physicians providing medical care to patient is listed in the Snapshot section of the chart.  Taking calcium VIT D as appropriate/Osteoporosis risk reviewed.    Safety issues reviewed; Husband has parkinson's of which she is the primary caretaker in the home. Smoke and carbon monoxide detectors in the home. No firearms in the home. Wears seatbelts when driving or riding with others. No violence in the home.  They do not have excessive sun exposure.  Discussed the need for sun protection: hats, long sleeves and the use of sunscreen if there is significant sun exposure.  Patient is alert, normal appearance, oriented to person/place/and time. Correctly identified the president of the [place] and recalls of 2/3 words.Performs simple calculations and can read correct time from watch face. Displays appropriate judgement.  No new identified risk were noted.  No failures at ADL's or IADL's.    BMI- discussed the importance of a healthy diet, water intake and the benefits of aerobic exercise. Educational material provided.   24 hour diet recall: Regular diet  Dental- every 6 months.  Patient Concerns: None at this time. Follow up with PCP as needed.  Exercise Activities and Dietary  recommendations Current Exercise Habits: Home exercise routine, Type of exercise: walking;treadmill, Time (Minutes): 20, Frequency (Times/Week): 6, Weekly Exercise (Minutes/Week): 120, Intensity: Mild  Goals    . Increase physical activity     Walk for exercise Stationary bike for exercise       Fall Risk Fall Risk  03/23/2018 03/21/2017 03/21/2016  Falls in the past year? Yes Yes No  Number falls in past yr: 1 1 -  Injury with Fall? No Yes -  Comment She lost her footing gardening.  She missed a step.  R foot fx in 3 places.  Followed Dr. Earnestine Leys. -  Follow up Falls prevention discussed;Education provided Falls prevention discussed;Education provided -   Depression Screen PHQ 2/9 Scores 03/23/2018 03/21/2017 03/21/2016 07/15/2012  PHQ - 2 Score 0 0 0 0     Cognitive Function MMSE - Mini Mental State Exam 03/23/2018 03/21/2017 03/21/2016  Orientation to time 5 5 5   Orientation to Place 5 5 5   Registration 3 3 3   Attention/ Calculation 5 5 5   Recall 3 3 3   Language- name 2 objects 2 2 2   Language- repeat 1 1 1   Language- follow 3 step command 3 3 3   Language- read & follow direction 1 1 1   Write a sentence 1 1 1   Copy design 1 1 1   Total score 30 30 30         Immunization History  Administered Date(s) Administered  . Influenza Split 06/05/2011, 05/26/2012, 06/24/2013, 06/09/2014  . Influenza-Unspecified 05/17/2015, 05/16/2016  . PPD Test 04/17/2015  . Pneumococcal Conjugate-13 06/16/2014  . Pneumococcal Polysaccharide-23 07/11/2011, 04/14/2017  . Tdap 07/14/2012  . Zoster 07/16/2010  . Zoster Recombinat (Shingrix) 11/19/2017, 02/23/2018   Screening Tests Health Maintenance  Topic Date Due  . COLONOSCOPY  08/26/2014  . INFLUENZA VACCINE  03/26/2018  . MAMMOGRAM  05/09/2019  . TETANUS/TDAP  07/14/2022  . DEXA SCAN  Completed  .  PNA vac Low Risk Adult  Completed      Plan:   End of life planning; Advance aging; Advanced directives discussed. Copy of current  HCPOA/Living Will requested.    I have personally reviewed and noted the following in the patient's chart:   . Medical and social history . Use of alcohol, tobacco or illicit drugs  . Current medications and supplements . Functional ability and status . Nutritional status . Physical activity . Advanced directives . List of other physicians . Hospitalizations, surgeries, and ER visits in previous 12 months . Vitals . Screenings to include cognitive, depression, and falls . Referrals and appointments  In addition, I have reviewed and discussed with patient certain preventive protocols, quality metrics, and best practice recommendations. A written personalized care plan for preventive services as well as general preventive health recommendations were provided to patient.     OBrien-Blaney, Dorean Daniello L, LPN  1/63/[id number]    I have reviewed the above information and agree with above.   Deborra Medina, MD

## 2018-04-08 DIAGNOSIS — E034 Atrophy of thyroid (acquired): Secondary | ICD-10-CM

## 2018-04-08 DIAGNOSIS — R5383 Other fatigue: Secondary | ICD-10-CM

## 2018-04-08 DIAGNOSIS — E559 Vitamin D deficiency, unspecified: Secondary | ICD-10-CM

## 2018-04-08 DIAGNOSIS — E785 Hyperlipidemia, unspecified: Secondary | ICD-10-CM

## 2018-04-08 DIAGNOSIS — E538 Deficiency of other specified B group vitamins: Secondary | ICD-10-CM

## 2018-04-09 DIAGNOSIS — J019 Acute sinusitis, unspecified: Secondary | ICD-10-CM | POA: Diagnosis not present

## 2018-04-13 ENCOUNTER — Other Ambulatory Visit (INDEPENDENT_AMBULATORY_CARE_PROVIDER_SITE_OTHER): Payer: Medicare Other

## 2018-04-13 DIAGNOSIS — E034 Atrophy of thyroid (acquired): Secondary | ICD-10-CM | POA: Diagnosis not present

## 2018-04-13 DIAGNOSIS — R5383 Other fatigue: Secondary | ICD-10-CM | POA: Diagnosis not present

## 2018-04-13 DIAGNOSIS — E538 Deficiency of other specified B group vitamins: Secondary | ICD-10-CM | POA: Diagnosis not present

## 2018-04-13 DIAGNOSIS — E559 Vitamin D deficiency, unspecified: Secondary | ICD-10-CM | POA: Diagnosis not present

## 2018-04-13 LAB — CBC WITH DIFFERENTIAL/PLATELET
BASOS ABS: 0 10*3/uL (ref 0.0–0.1)
Basophils Relative: 0.1 % (ref 0.0–3.0)
EOS ABS: 0 10*3/uL (ref 0.0–0.7)
Eosinophils Relative: 0.4 % (ref 0.0–5.0)
HEMATOCRIT: 41.2 % (ref 36.0–46.0)
Hemoglobin: 13.7 g/dL (ref 12.0–15.0)
LYMPHS PCT: 15.9 % (ref 12.0–46.0)
Lymphs Abs: 0.9 10*3/uL (ref 0.7–4.0)
MCHC: 33.2 g/dL (ref 30.0–36.0)
MCV: 94.2 fl (ref 78.0–100.0)
MONO ABS: 0.4 10*3/uL (ref 0.1–1.0)
Monocytes Relative: 7 % (ref 3.0–12.0)
NEUTROS ABS: 4.3 10*3/uL (ref 1.4–7.7)
NEUTROS PCT: 76.6 % (ref 43.0–77.0)
PLATELETS: 190 10*3/uL (ref 150.0–400.0)
RBC: 4.37 Mil/uL (ref 3.87–5.11)
RDW: 13.3 % (ref 11.5–15.5)
WBC: 5.6 10*3/uL (ref 4.0–10.5)

## 2018-04-13 LAB — COMPREHENSIVE METABOLIC PANEL
ALK PHOS: 54 U/L (ref 39–117)
ALT: 13 U/L (ref 0–35)
AST: 13 U/L (ref 0–37)
Albumin: 4.3 g/dL (ref 3.5–5.2)
BILIRUBIN TOTAL: 0.6 mg/dL (ref 0.2–1.2)
BUN: 18 mg/dL (ref 6–23)
CALCIUM: 9.6 mg/dL (ref 8.4–10.5)
CO2: 29 mEq/L (ref 19–32)
CREATININE: 0.9 mg/dL (ref 0.40–1.20)
Chloride: 107 mEq/L (ref 96–112)
GFR: 64.91 mL/min (ref >60.00)
GLUCOSE: 100 mg/dL — AB (ref 70–99)
Potassium: 4.1 mEq/L (ref 3.5–5.1)
Sodium: 143 mEq/L (ref 135–145)
TOTAL PROTEIN: 6.9 g/dL (ref 6.0–8.3)

## 2018-04-13 LAB — TSH: TSH: 0.9 u[IU]/mL (ref 0.35–4.50)

## 2018-04-13 LAB — VITAMIN D 25 HYDROXY (VIT D DEFICIENCY, FRACTURES): VITD: 38.53 ng/mL (ref 30.00–100.00)

## 2018-04-13 LAB — VITAMIN B12: VITAMIN B 12: 334 pg/mL (ref 211–911)

## 2018-04-15 ENCOUNTER — Telehealth: Payer: Self-pay | Admitting: Internal Medicine

## 2018-04-15 DIAGNOSIS — C44612 Basal cell carcinoma of skin of right upper limb, including shoulder: Secondary | ICD-10-CM | POA: Diagnosis not present

## 2018-04-15 DIAGNOSIS — C44619 Basal cell carcinoma of skin of left upper limb, including shoulder: Secondary | ICD-10-CM | POA: Diagnosis not present

## 2018-04-15 NOTE — Telephone Encounter (Signed)
Please call Samantha Hanna and set up a visit with me  In the next month.. routine checkup.   Daughter [name] would also like to come

## 2018-04-15 NOTE — Telephone Encounter (Signed)
Lm on patients vm to call office and schedule appt.

## 2018-05-11 ENCOUNTER — Other Ambulatory Visit: Payer: Self-pay | Admitting: Internal Medicine

## 2018-06-01 DIAGNOSIS — R51 Headache: Secondary | ICD-10-CM | POA: Diagnosis not present

## 2018-06-01 DIAGNOSIS — J309 Allergic rhinitis, unspecified: Secondary | ICD-10-CM | POA: Diagnosis not present

## 2018-06-08 DIAGNOSIS — Z23 Encounter for immunization: Secondary | ICD-10-CM | POA: Diagnosis not present

## 2018-06-09 DIAGNOSIS — J301 Allergic rhinitis due to pollen: Secondary | ICD-10-CM | POA: Diagnosis not present

## 2018-06-18 ENCOUNTER — Ambulatory Visit (INDEPENDENT_AMBULATORY_CARE_PROVIDER_SITE_OTHER): Payer: Medicare Other | Admitting: Family Medicine

## 2018-06-18 ENCOUNTER — Encounter: Payer: Self-pay | Admitting: Family Medicine

## 2018-06-18 VITALS — BP 132/86 | HR 79 | Temp 97.5°F | Ht 66.0 in | Wt 136.4 lb

## 2018-06-18 DIAGNOSIS — F4329 Adjustment disorder with other symptoms: Secondary | ICD-10-CM | POA: Diagnosis not present

## 2018-06-18 DIAGNOSIS — T148XXA Other injury of unspecified body region, initial encounter: Secondary | ICD-10-CM

## 2018-06-18 DIAGNOSIS — R61 Generalized hyperhidrosis: Secondary | ICD-10-CM

## 2018-06-18 LAB — COMPREHENSIVE METABOLIC PANEL
ALT: 17 U/L (ref 0–35)
AST: 17 U/L (ref 0–37)
Albumin: 4.7 g/dL (ref 3.5–5.2)
Alkaline Phosphatase: 59 U/L (ref 39–117)
BUN: 14 mg/dL (ref 6–23)
CALCIUM: 9.7 mg/dL (ref 8.4–10.5)
CO2: 28 meq/L (ref 19–32)
CREATININE: 0.82 mg/dL (ref 0.40–1.20)
Chloride: 105 mEq/L (ref 96–112)
GFR: 72.24 mL/min (ref >60.00)
GLUCOSE: 104 mg/dL — AB (ref 70–99)
Potassium: 3.9 mEq/L (ref 3.5–5.1)
Sodium: 142 mEq/L (ref 135–145)
TOTAL PROTEIN: 7.6 g/dL (ref 6.0–8.3)
Total Bilirubin: 0.7 mg/dL (ref 0.2–1.2)

## 2018-06-18 LAB — TSH: TSH: 4.19 u[IU]/mL (ref 0.35–4.50)

## 2018-06-18 LAB — CBC
HCT: 43.6 % (ref 36.0–46.0)
Hemoglobin: 14.6 g/dL (ref 12.0–15.0)
MCHC: 33.4 g/dL (ref 30.0–36.0)
MCV: 95.4 fl (ref 78.0–100.0)
PLATELETS: 203 10*3/uL (ref 150.0–400.0)
RBC: 4.57 Mil/uL (ref 3.87–5.11)
RDW: 13.5 % (ref 11.5–15.5)
WBC: 5 10*3/uL (ref 4.0–10.5)

## 2018-06-18 MED ORDER — MUPIROCIN 2 % EX OINT: 1.0000 "application " | TOPICAL_OINTMENT | Freq: Two times a day (BID) | CUTANEOUS | 0 refills | Status: DC

## 2018-06-18 MED ORDER — SERTRALINE HCL 25 MG PO TABS: 25.0000 mg | ORAL_TABLET | Freq: Every day | ORAL | 2 refills | Status: DC

## 2018-06-18 NOTE — Progress Notes (Signed)
Subjective:    Patient ID: Samantha Hanna, female    DOB: 04-09-1941, 77 y.o.   MRN: [id number]  HPI   Patient presents to clinic due to a sore area on left third toe for about 5 days.  She puts the triple antibiotic ointment on it at home, and Band-Aid seems to be somewhat better today.  Denies any known injury to foot, but it is possible the area could have rubbed in her shoe.  Patient also reports that she has been having this feeling of waking up hot and sweaty every night between 3 and 5 AM.  States it will come on as a very warm/burning sensation all over her chest and up around the neck, and will wake her up.  States she will have to take the covers off, but then when takes covers off feels too cold.  Lab work done back in August 2019 all normal.  Patient does report she has increased stress of her life due to husband's diagnosis of Parkinson's which has changed her life in many ways.  Patient does think maybe her feeling hot and sweaty every night could be related to stress and/or anxiety.  Patient Active Problem List   Diagnosis Date Noted  . Acute sinusitis 12/07/2017  . Delirium, drug-induced ([place]) 09/17/2017  . Abnormal urinalysis 09/08/2017  . 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  . Impingement syndrome of left shoulder region 02/28/2017  . Sprain of ankle 01/07/2017  . Chest pain at rest 09/24/2016  . Cervical neck pain with evidence of disc disease 03/24/2016  . Depression 01/15/2016  . Benign paroxysmal positional vertigo 01/08/2016  . Carpal tunnel syndrome 01/08/2016  . Symptoms, such as flushing, sleeplessness, headache, lack of concentration, associated with the menopause 05/14/2015  . Spinal stenosis in cervical region 05/14/2015  . Occipital headache 04/18/2015  . Anxiety state 04/18/2015  . Age-related cognitive decline 04/18/2015  . Cervical radiculitis 04/07/2015  . Caregiver with fatigue 12/01/2014  . B12  deficiency 02/15/2014  . Headache 02/09/2014  . Irritable bowel syndrome 09/08/2013  . Preoperative evaluation to rule out surgical contraindication 07/15/2012  . Hypothyroidism 07/15/2012  . Statin intolerance 07/14/2012  . Screening for breast cancer 07/11/2011  . Screening for colon cancer 07/11/2011   Review of Systems   Constitutional: Negative for chills, fatigue and fever. +feeling of burning/heat every night HENT: Negative for congestion, ear pain, sinus pain and sore throat.   Eyes: Negative.   Respiratory: Negative for cough, shortness of breath and wheezing.   Cardiovascular: Negative for chest pain, palpitations and leg swelling.  Gastrointestinal: Negative for abdominal pain, diarrhea, nausea and vomiting.  Genitourinary: Negative for dysuria, frequency and urgency.  Musculoskeletal: Negative for arthralgias and myalgias.  Skin: irritation on left 3rd toe Neurological: Negative for syncope, light-headedness and headaches.  Psychiatric/Behavioral: increased stress       Objective:   Physical Exam  Constitutional: She appears well-developed and well-nourished. No distress.  HENT:  Head: Normocephalic and atraumatic.  Eyes: EOM are normal. No scleral icterus.  Neck: Normal range of motion. Neck supple. No tracheal deviation present.  Cardiovascular: Normal rate, regular rhythm and normal heart sounds.  Pulmonary/Chest: Effort normal and breath sounds normal. No respiratory distress. She has no wheezes. She has no rales.  Abdominal: Soft. Bowel sounds are normal. There is no tenderness.  Neurological: She is alert and oriented to person, place, and time.  Gait normal  Skin: Skin is  warm and dry. Small blister on top of left 3rd toe, about size of pea, skin minimally red.  Psychiatric: She has a normal mood and affect. Her behavior is normal. Thought content normal.  Nursing note and vitals reviewed.     Vitals:   06/18/18 0904  BP: 132/86  Pulse: 79  Temp: (!)  97.5 F (36.4 C)  SpO2: 97%   Assessment & Plan:   Blister on the left third toe - patient will do thin layer of mupirocin ointment once a day, and cover with Band-Aid for the next 5 days, and monitor for improvement.  Patient advised to try and wear shoes that do not rub on the areas of skin has time to heal.  Chronic night sweats - it is possible this is related to stress and anxiety.  We will repeat some lab work including CBC, CMP and TSH to see if any of these advised of change compared to lab work in August.  Stress and adjustment reaction- I suspect the feeling hot/sweaty at night could be related to increased stress in her life.  Patient is agreeable to try low-dose Zoloft 25 mg at bedtime once daily to see if this makes a difference.  Patient will follow-up in 3 weeks for recheck on how she is feeling after addition of Zoloft.

## 2018-06-22 DIAGNOSIS — R5381 Other malaise: Secondary | ICD-10-CM | POA: Diagnosis not present

## 2018-06-22 DIAGNOSIS — R51 Headache: Secondary | ICD-10-CM | POA: Diagnosis not present

## 2018-06-23 DIAGNOSIS — M5412 Radiculopathy, cervical region: Secondary | ICD-10-CM | POA: Diagnosis not present

## 2018-06-23 DIAGNOSIS — M7542 Impingement syndrome of left shoulder: Secondary | ICD-10-CM | POA: Diagnosis not present

## 2018-07-03 DIAGNOSIS — M25512 Pain in left shoulder: Secondary | ICD-10-CM | POA: Diagnosis not present

## 2018-07-03 DIAGNOSIS — M542 Cervicalgia: Secondary | ICD-10-CM | POA: Diagnosis not present

## 2018-07-08 DIAGNOSIS — M542 Cervicalgia: Secondary | ICD-10-CM | POA: Diagnosis not present

## 2018-07-08 DIAGNOSIS — M25512 Pain in left shoulder: Secondary | ICD-10-CM | POA: Diagnosis not present

## 2018-07-10 DIAGNOSIS — M542 Cervicalgia: Secondary | ICD-10-CM | POA: Diagnosis not present

## 2018-07-10 DIAGNOSIS — M25512 Pain in left shoulder: Secondary | ICD-10-CM | POA: Diagnosis not present

## 2018-07-13 ENCOUNTER — Ambulatory Visit (INDEPENDENT_AMBULATORY_CARE_PROVIDER_SITE_OTHER): Payer: Medicare Other | Admitting: Internal Medicine

## 2018-07-13 ENCOUNTER — Encounter: Payer: Self-pay | Admitting: Internal Medicine

## 2018-07-13 VITALS — BP 154/88 | HR 63 | Temp 97.5°F | Resp 14 | Ht 66.0 in | Wt 137.2 lb

## 2018-07-13 DIAGNOSIS — R4189 Other symptoms and signs involving cognitive functions and awareness: Secondary | ICD-10-CM | POA: Diagnosis not present

## 2018-07-13 DIAGNOSIS — M7542 Impingement syndrome of left shoulder: Secondary | ICD-10-CM

## 2018-07-13 DIAGNOSIS — G8929 Other chronic pain: Secondary | ICD-10-CM | POA: Diagnosis not present

## 2018-07-13 DIAGNOSIS — M25512 Pain in left shoulder: Secondary | ICD-10-CM | POA: Diagnosis not present

## 2018-07-13 DIAGNOSIS — N951 Menopausal and female climacteric states: Secondary | ICD-10-CM | POA: Diagnosis not present

## 2018-07-13 DIAGNOSIS — E785 Hyperlipidemia, unspecified: Secondary | ICD-10-CM | POA: Diagnosis not present

## 2018-07-13 DIAGNOSIS — Z1231 Encounter for screening mammogram for malignant neoplasm of breast: Secondary | ICD-10-CM | POA: Diagnosis not present

## 2018-07-13 DIAGNOSIS — E034 Atrophy of thyroid (acquired): Secondary | ICD-10-CM

## 2018-07-13 DIAGNOSIS — F411 Generalized anxiety disorder: Secondary | ICD-10-CM

## 2018-07-13 DIAGNOSIS — R4181 Age-related cognitive decline: Secondary | ICD-10-CM

## 2018-07-13 DIAGNOSIS — M542 Cervicalgia: Secondary | ICD-10-CM | POA: Diagnosis not present

## 2018-07-13 LAB — LIPID PANEL
CHOLESTEROL: 316 mg/dL — AB (ref 0–200)
HDL: 75.6 mg/dL (ref >39.00)
LDL Cholesterol: 221 mg/dL — ABNORMAL HIGH (ref 0–99)
NonHDL: 240.05
TRIGLYCERIDES: 94 mg/dL (ref 0.0–149.0)
Total CHOL/HDL Ratio: 4
VLDL: 18.8 mg/dL (ref 0.0–40.0)

## 2018-07-13 LAB — VITAMIN B12: Vitamin B-12: 415 pg/mL (ref 211–911)

## 2018-07-13 MED ORDER — MELOXICAM 7.5 MG PO TABS: 7.5000 mg | ORAL_TABLET | Freq: Every day | ORAL | 11 refills | Status: DC

## 2018-07-13 NOTE — Patient Instructions (Addendum)
I recommend resuming generic Zoloft (sertraline) in the evening AFTER DINNER for your anxiety   I recommend Taking 650 mg or 1000 mg  Of tylenol BEFORE YOUR PHYSICAL THERAPY SESSION   Add once daily meloxicam for your anti inflammatory (no using advil or aleve when taking this )

## 2018-07-13 NOTE — Progress Notes (Signed)
Subjective:  Patient ID: Samantha Hanna, female    DOB: 1940/12/10  Age: 77 y.o. MRN: [id number]  CC: The primary encounter diagnosis was Encounter for screening mammogram for breast cancer. Diagnoses of Hyperlipidemia LDL goal <130, Cognitive changes, Age-related cognitive decline, Symptoms, such as flushing, sleeplessness, headache, lack of concentration, associated with the menopause, Anxiety state, Chronic left shoulder pain, Hypothyroidism due to acquired atrophy of thyroid, and Impingement syndrome of left shoulder region were also pertinent to this visit.  HPI Samantha Hanna presents for follow up on GAD aggravated by husband's decline from Parkinson's Disease  Treated on Oct 24 with zoloft 25 mg daily by [facility]. She is no longer taking the medication . She can't remember why. She stopped taking it.  She denies any adverse effects  From it   Increased stressors .    Her recurrent night sweats (chronic,  Since 2016 per review of chart:  CBC , chest x ray and abd ultrasound all normal )  have resolved   Chronic sinus issues .  Irrigates on a regular basis.  No infectious symptoms.    Mammogram due at [place] treating her  Cervical radiculitis and impingement syndrome involving left shoulder with robaxin  ,  GABAPENTIN AND physical therapy for 4 weeks.      Outpatient Medications Prior to Visit  Medication Sig Dispense Refill  . acetaminophen (TYLENOL) 325 MG tablet Take 325 mg by mouth every 6 (six) hours as needed for mild pain or headache.    . cholecalciferol (VITAMIN D) 1000 UNITS tablet Take 1,000 Units by mouth daily.      . cyanocobalamin (,VITAMIN B-12,) 1000 MCG/ML injection INJECT 1 ML INTO THE MUSCLE EVERY 14 DAYS. FOR DAILY/WEEKLY/MONTHLY PER MD DIRECTIONS FOR B12 DEFICENCY. 10 mL 11  . fluticasone (FLONASE) 50 MCG/ACT nasal spray Place 1 spray into both nostrils daily as needed for allergies or rhinitis.     . folic acid (FOLVITE) 703 MCG tablet Take 400 mcg  by mouth daily.    . Multiple Vitamin (MULTIVITAMIN WITH MINERALS) TABS tablet Take 1 tablet by mouth daily.    . mupirocin ointment (BACTROBAN) 2 % Place 1 application into the nose 2 (two) times daily. 22 g 0  . Omega-3 Fatty Acids (OMEGA 3 PO) Take 30 mLs by mouth daily. 800 mg of Fish Oil  per serving    . Polyvinyl Alcohol-Povidone (REFRESH OP) Apply 1 drop to eye daily as needed (dry eyes).    . SYNTHROID 75 MCG tablet TAKE 1 TABLET EVERY DAY ON EMPTY STOMACHWITH A GLASS OF WATER AT LEAST 30-60 MINBEFORE BREAKFAST 90 tablet 0  . ALPRAZolam (XANAX) 0.25 MG tablet TAKE HALF TABLET BY MOUTH AS NEEDED FOR SLEEP-PT NOT CURRENTLY TAKING    . cyclobenzaprine (FLEXERIL) 10 MG tablet Take 10 mg by mouth daily as needed for muscle spasms.    Marland Kitchen levofloxacin (LEVAQUIN) 500 MG tablet TAKE ONE TABLET EVERY DAY (Patient not taking: Reported on 07/13/2018) 7 tablet 0  . pantoprazole (PROTONIX) 40 MG tablet Take 1 tablet (40 mg total) by mouth daily. (Patient not taking: Reported on 07/13/2018) 90 tablet 1  . sertraline (ZOLOFT) 25 MG tablet Take 1 tablet (25 mg total) by mouth at bedtime. (Patient not taking: Reported on 07/13/2018) 30 tablet 2   No facility-administered medications prior to visit.     Review of Systems;  Patient denies headache, fevers, malaise, unintentional weight loss, skin rash, eye pain, sinus congestion and  sinus pain, sore throat, dysphagia,  hemoptysis , cough, dyspnea, wheezing, chest pain, palpitations, orthopnea, edema, abdominal pain, nausea, melena, diarrhea, constipation, flank pain, dysuria, hematuria, urinary  Frequency, nocturia, numbness, tingling, seizures,  Focal weakness, Loss of consciousness,  Tremor, insomnia, depression, anxiety, and suicidal ideation.      Objective:  BP (!) 154/88 (BP Location: Left Arm, Patient Position: Sitting, Cuff Size: Normal)   Pulse 63   Temp (!) 97.5 F (36.4 C) (Oral)   Resp 14   Ht 5\' 6"  (1.676 m)   Wt 137 lb 3.2 oz (62.2 kg)    SpO2 98%   BMI 22.14 kg/m   BP Readings from Last 3 Encounters:  07/13/18 (!) 154/88  06/18/18 132/86  03/23/18 110/70    Wt Readings from Last 3 Encounters:  07/13/18 137 lb 3.2 oz (62.2 kg)  06/18/18 136 lb 6.4 oz (61.9 kg)  03/23/18 135 lb (61.2 kg)    General appearance: alert, cooperative and appears stated age Ears: normal TM's and external ear canals both ears Throat: lips, mucosa, and tongue normal; teeth and gums normal Neck: no adenopathy, no carotid bruit, supple, symmetrical, trachea midline and thyroid not enlarged, symmetric, no tenderness/mass/nodules Back: symmetric, no curvature. ROM normal. No CVA tenderness. Lungs: clear to auscultation bilaterally Heart: regular rate and rhythm, S1, S2 normal, no murmur, click, rub or gallop Abdomen: soft, non-tender; bowel sounds normal; no masses,  no organomegaly Pulses: 2+ and symmetric Skin: Skin color, texture, turgor normal. No rashes or lesions Lymph nodes: Cervical, supraclavicular, and axillary nodes normal.  No results found for: HGBA1C  Lab Results  Component Value Date   CREATININE 0.82 06/18/2018   CREATININE 0.90 04/13/2018   CREATININE 0.70 09/04/2017    Lab Results  Component Value Date   WBC 5.0 06/18/2018   HGB 14.6 06/18/2018   HCT 43.6 06/18/2018   PLT 203.0 06/18/2018   GLUCOSE 104 (H) 06/18/2018   CHOL 316 (H) 07/13/2018   TRIG 94.0 07/13/2018   HDL 75.60 07/13/2018   LDLDIRECT 172.6 09/08/2013   LDLCALC 221 (H) 07/13/2018   ALT 17 06/18/2018   AST 17 06/18/2018   NA 142 06/18/2018   K 3.9 06/18/2018   CL 105 06/18/2018   CREATININE 0.82 06/18/2018   BUN 14 06/18/2018   CO2 28 06/18/2018   TSH 4.19 06/18/2018    No results found.  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  No current changes needed.   Lab Results  Component Value Date   TSH 4.19 06/18/2018         Impingement syndrome of left shoulder region    Undergoing PT , post injection .  recommend meloxicam and tylenol use PRIOR to PT to improve tolerance of stretching .      Shoulder pain, left    Tylenol,meloxicam use recommended prior to PT for shoulder,      Symptoms, such as flushing, sleeplessness, headache, lack of concentration, associated with the menopause    Recurrent in the past symptoms have  improved with lexapro and again with zoloft.  no changes to regimen today.  resuming zoloft  Other Visit Diagnoses    Encounter for screening mammogram for breast cancer    -  Primary   Relevant Orders   MM DIGITAL SCREENING BILATERAL   Hyperlipidemia LDL goal <130       Relevant Orders   Lipid panel (Completed)   Cognitive changes       Relevant Orders   Vitamin B12 (Completed)     A total of 25 minutes of face to face time was spent with patient more than half of which was spent in counselling about the above mentioned conditions  and coordination of care  I have discontinued Annelisa B. Kuechle's pantoprazole, ALPRAZolam, cyclobenzaprine, levofloxacin, and sertraline. I am also having her start on meloxicam. Additionally, I am having her maintain her cholecalciferol, fluticasone, cyanocobalamin, folic acid, multivitamin with minerals, Omega-3 Fatty Acids (OMEGA 3 PO), acetaminophen, Polyvinyl Alcohol-Povidone (REFRESH OP), SYNTHROID, and mupirocin ointment.  Meds ordered this encounter  Medications  . meloxicam (MOBIC) 7.5 MG tablet    Sig: Take 1 tablet (7.5 mg total) by mouth daily. For shoulder pain    Dispense:  30 tablet    Refill:  11    Medications Discontinued During This Encounter  Medication Reason  . cyclobenzaprine (FLEXERIL) 10 MG tablet Error  . levofloxacin (LEVAQUIN) 500 MG tablet Error  .  pantoprazole (PROTONIX) 40 MG tablet Patient Preference  . sertraline (ZOLOFT) 25 MG tablet Patient Preference  . ALPRAZolam (XANAX) 0.25 MG tablet     Follow-up: Return in about 3 months (around 10/13/2018) for anxiety .   Crecencio Mc, MD

## 2018-07-14 ENCOUNTER — Encounter: Payer: Self-pay | Admitting: Internal Medicine

## 2018-07-14 DIAGNOSIS — M5412 Radiculopathy, cervical region: Secondary | ICD-10-CM | POA: Diagnosis not present

## 2018-07-14 DIAGNOSIS — M25512 Pain in left shoulder: Secondary | ICD-10-CM | POA: Diagnosis not present

## 2018-07-14 NOTE — Assessment & Plan Note (Signed)
Recurrent in the past symptoms have  improved with lexapro and again with zoloft.  no changes to regimen today.  resuming zoloft

## 2018-07-14 NOTE — Assessment & Plan Note (Signed)
I am recommending that she consider private assistance at home to manage medications

## 2018-07-14 NOTE — Assessment & Plan Note (Addendum)
Tylenol,meloxicam use recommended prior to PT for shoulder,

## 2018-07-14 NOTE — Assessment & Plan Note (Signed)
" >>  ASSESSMENT AND PLAN FOR VASCULAR DEMENTIA (HCC) WRITTEN ON 07/14/2018  9:15 PM BY Dontarius Sheley L, MD  I am recommending that she consider private assistance at home to manage medications  "

## 2018-07-14 NOTE — Assessment & Plan Note (Signed)
Encouraged to resume zoloft daily

## 2018-07-14 NOTE — Assessment & Plan Note (Addendum)
Undergoing PT , post injection .  recommend meloxicam and tylenol use PRIOR to PT to improve tolerance of stretching .

## 2018-07-14 NOTE — Assessment & Plan Note (Signed)
Thyroid function is WNL on current dose.  No current changes needed.   Lab Results  Component Value Date   TSH 4.19 06/18/2018

## 2018-07-27 DIAGNOSIS — M5412 Radiculopathy, cervical region: Secondary | ICD-10-CM | POA: Diagnosis not present

## 2018-07-27 DIAGNOSIS — M25512 Pain in left shoulder: Secondary | ICD-10-CM | POA: Diagnosis not present

## 2018-07-29 DIAGNOSIS — M5412 Radiculopathy, cervical region: Secondary | ICD-10-CM | POA: Diagnosis not present

## 2018-08-15 ENCOUNTER — Other Ambulatory Visit: Payer: Self-pay | Admitting: Internal Medicine

## 2018-08-20 ENCOUNTER — Telehealth: Payer: Self-pay | Admitting: Internal Medicine

## 2018-08-20 NOTE — Telephone Encounter (Signed)
Pt spouse called to ask about 07/13/18 bill they received for $46 for B-12 and lipid panel. Pt spouse, Tommie, called billing dept and was asked if he signed an [place]. He called our office to check on this, I do not see where it was signed and told him I would send a msg over to provider office for follow up. He is asking for a call back to discuss.

## 2018-08-24 NOTE — Telephone Encounter (Signed)
Hi Vanessa,   The [phone] bill is coming from the b12 lab for DOS 07/13/18.  A GA modifier was attached which would indicate that an ABN was signed.  Since an ABN was not actually signed, I will send to charge corrections to have them remove the GA modifier.  I will CC you on the email to them.  Thanks, Sharyn Lull

## 2018-08-24 NOTE — Telephone Encounter (Signed)
Hello Sharyn Lull, I don't see that an ABN was signed . What should I do next.

## 2018-09-03 DIAGNOSIS — C44619 Basal cell carcinoma of skin of left upper limb, including shoulder: Secondary | ICD-10-CM | POA: Diagnosis not present

## 2018-09-03 DIAGNOSIS — C44612 Basal cell carcinoma of skin of right upper limb, including shoulder: Secondary | ICD-10-CM | POA: Diagnosis not present

## 2018-09-03 DIAGNOSIS — M71372 Other bursal cyst, left ankle and foot: Secondary | ICD-10-CM | POA: Diagnosis not present

## 2018-09-03 DIAGNOSIS — Z85828 Personal history of other malignant neoplasm of skin: Secondary | ICD-10-CM | POA: Diagnosis not present

## 2018-09-03 DIAGNOSIS — Z85820 Personal history of malignant melanoma of skin: Secondary | ICD-10-CM | POA: Diagnosis not present

## 2018-09-03 DIAGNOSIS — Z872 Personal history of diseases of the skin and subcutaneous tissue: Secondary | ICD-10-CM | POA: Diagnosis not present

## 2018-09-03 DIAGNOSIS — Z08 Encounter for follow-up examination after completed treatment for malignant neoplasm: Secondary | ICD-10-CM | POA: Diagnosis not present

## 2018-09-03 DIAGNOSIS — Z09 Encounter for follow-up examination after completed treatment for conditions other than malignant neoplasm: Secondary | ICD-10-CM | POA: Diagnosis not present

## 2018-09-03 DIAGNOSIS — D485 Neoplasm of uncertain behavior of skin: Secondary | ICD-10-CM | POA: Diagnosis not present

## 2018-09-30 DIAGNOSIS — C44612 Basal cell carcinoma of skin of right upper limb, including shoulder: Secondary | ICD-10-CM | POA: Diagnosis not present

## 2018-10-13 DIAGNOSIS — M479 Spondylosis, unspecified: Secondary | ICD-10-CM | POA: Diagnosis not present

## 2018-10-14 DIAGNOSIS — C44619 Basal cell carcinoma of skin of left upper limb, including shoulder: Secondary | ICD-10-CM | POA: Diagnosis not present

## 2018-10-19 ENCOUNTER — Encounter: Payer: Self-pay | Admitting: Internal Medicine

## 2018-10-19 ENCOUNTER — Ambulatory Visit (INDEPENDENT_AMBULATORY_CARE_PROVIDER_SITE_OTHER): Payer: Medicare Other | Admitting: Internal Medicine

## 2018-10-19 VITALS — BP 118/68 | HR 85 | Temp 97.5°F | Resp 16 | Ht 66.0 in | Wt 136.4 lb

## 2018-10-19 DIAGNOSIS — R4181 Age-related cognitive decline: Secondary | ICD-10-CM

## 2018-10-19 DIAGNOSIS — M5441 Lumbago with sciatica, right side: Secondary | ICD-10-CM | POA: Diagnosis not present

## 2018-10-19 DIAGNOSIS — M7542 Impingement syndrome of left shoulder: Secondary | ICD-10-CM

## 2018-10-19 DIAGNOSIS — F4329 Adjustment disorder with other symptoms: Secondary | ICD-10-CM

## 2018-10-19 DIAGNOSIS — M545 Low back pain, unspecified: Secondary | ICD-10-CM

## 2018-10-19 DIAGNOSIS — N951 Menopausal and female climacteric states: Secondary | ICD-10-CM

## 2018-10-19 DIAGNOSIS — R5383 Other fatigue: Secondary | ICD-10-CM

## 2018-10-19 DIAGNOSIS — M5442 Lumbago with sciatica, left side: Secondary | ICD-10-CM | POA: Diagnosis not present

## 2018-10-19 DIAGNOSIS — F329 Major depressive disorder, single episode, unspecified: Secondary | ICD-10-CM

## 2018-10-19 DIAGNOSIS — F32A Depression, unspecified: Secondary | ICD-10-CM

## 2018-10-19 DIAGNOSIS — E538 Deficiency of other specified B group vitamins: Secondary | ICD-10-CM

## 2018-10-19 LAB — COMPREHENSIVE METABOLIC PANEL
ALBUMIN: 4.5 g/dL (ref 3.5–5.2)
ALT: 14 U/L (ref 0–35)
AST: 16 U/L (ref 0–37)
Alkaline Phosphatase: 61 U/L (ref 39–117)
BUN: 21 mg/dL (ref 6–23)
CO2: 32 mEq/L (ref 19–32)
CREATININE: 0.87 mg/dL (ref 0.40–1.20)
Calcium: 9.4 mg/dL (ref 8.4–10.5)
Chloride: 106 mEq/L (ref 96–112)
GFR: 63.42 mL/min (ref >60.00)
Glucose, Bld: 122 mg/dL — ABNORMAL HIGH (ref 70–99)
Potassium: 3.9 mEq/L (ref 3.5–5.1)
SODIUM: 145 meq/L (ref 135–145)
TOTAL PROTEIN: 7.2 g/dL (ref 6.0–8.3)
Total Bilirubin: 0.5 mg/dL (ref 0.2–1.2)

## 2018-10-19 LAB — VITAMIN B12: Vitamin B-12: 416 pg/mL (ref 211–911)

## 2018-10-19 MED ORDER — SERTRALINE HCL 25 MG PO TABS: 25.0000 mg | ORAL_TABLET | Freq: Every day | ORAL | 2 refills | Status: DC

## 2018-10-19 NOTE — Patient Instructions (Addendum)
Continue using meloxicam daily with a meal   Add 2000 mg of acetominophen (tylenol) every day  In divided doses  (1000 mg every 12 hours.) this can be combined with your morning synthroid . Take the second dose with dinner .  You are grieving ,  But you are also depressed and anxious.   Please resume zoloft . Please resume  this after dinner with your tylenol  And meloxicam \  After one week,  Increase your dose of zoloft  to 50 mg daily  Please rent the movie "the Up Side"   It will lift your spirits!   Physical therapy for your back through [facility]  Return in 4 week

## 2018-10-19 NOTE — Progress Notes (Signed)
Subjective:  Patient ID: Samantha Hanna, female    DOB: 09-18-1940  Age: 78 y.o. MRN: [id number]  CC: The primary encounter diagnosis was B12 deficiency. Diagnoses of Stress and adjustment reaction, Fatigue due to depression, Right-sided low back pain with bilateral sciatica, unspecified chronicity, Acute midline low back pain without sciatica, Symptoms, such as flushing, sleeplessness, headache, lack of concentration, associated with the menopause, Impingement syndrome of left shoulder region, Caregiver with fatigue, and Age-related cognitive decline were also pertinent to this visit.  HPI Samantha Hanna presents for follow up on multiple somatic issues including persistent left  shoulder pain,  Recurrent morning hot flashes,  Decreased memory,  GAD with medication non adherence,     Recently developed lower back pain acutely after getting up from a chair.   Heard a "pop"  .  Had X rays done by Earnestine Leys.  Spurring but no fractures. Given meloxicam which is helping. But continues to feel that her lower back is problematic and pain is aggravated by prolonged standing  Wakes up feeling "on fire."  Does not sweat through her pajamas. has untreated anxiety due to stress of husband [name] Fitz's declining health and her increased responsibilities at home,  although tommy now was a driver  And takes care of the grocery shopping,  And she has a house cleaner who comes in every 2 weeks.  Doesn't recall why or when she stopped taking the zoloft after her  last visit in November.    Not sleeping well.  Has stopped walking daily due to feeling light headed occasionally.  Admits to feeling depressed .  Doesn't like taking medications,  Having trouble accepting the aging process ("I used to be so carefree and happy,").  No longer enjoys the company of many of her long time friends due to their negativity and open criticism of others. Has lost several friends in the last year due to death.   Taking b12 tablets  daily now after having several IM injections.   Lab Results  Component Value Date   VITAMINB12 416 10/19/2018    Outpatient Medications Prior to Visit  Medication Sig Dispense Refill  . acetaminophen (TYLENOL) 325 MG tablet Take 325 mg by mouth every 6 (six) hours as needed for mild pain or headache.    . cholecalciferol (VITAMIN D) 1000 UNITS tablet Take 1,000 Units by mouth daily.      . cyanocobalamin (,VITAMIN B-12,) 1000 MCG/ML injection INJECT 1 ML INTO THE MUSCLE EVERY 14 DAYS. FOR DAILY/WEEKLY/MONTHLY PER MD DIRECTIONS FOR B12 DEFICENCY. 10 mL 11  . fluticasone (FLONASE) 50 MCG/ACT nasal spray Place 1 spray into both nostrils daily as needed for allergies or rhinitis.     . folic acid (FOLVITE) 283 MCG tablet Take 400 mcg by mouth daily.    . meloxicam (MOBIC) 7.5 MG tablet Take 1 tablet (7.5 mg total) by mouth daily. For shoulder pain 30 tablet 11  . Multiple Vitamin (MULTIVITAMIN WITH MINERALS) TABS tablet Take 1 tablet by mouth daily.    . Omega-3 Fatty Acids (OMEGA 3 PO) Take 30 mLs by mouth daily. 800 mg of Fish Oil  per serving    . Polyvinyl Alcohol-Povidone (REFRESH OP) Apply 1 drop to eye daily as needed (dry eyes).    . SYNTHROID 75 MCG tablet TAKE 1 TABLET EVERY DAY ON EMPTY STOMACHWITH A GLASS OF WATER AT LEAST 30-60 MINBEFORE BREAKFAST 90 tablet 0  . mupirocin ointment (BACTROBAN) 2 % Place 1 application into the  nose 2 (two) times daily. (Patient not taking: Reported on 10/19/2018) 22 g 0   No facility-administered medications prior to visit.     Review of Systems;  Patient denies headache, fevers, malaise, unintentional weight loss, skin rash, eye pain, sinus congestion and sinus pain, sore throat, dysphagia,  hemoptysis , cough, dyspnea, wheezing, chest pain, palpitations, orthopnea, edema, abdominal pain, nausea, melena, diarrhea, constipation, flank pain, dysuria, hematuria, urinary  Frequency, nocturia, numbness, tingling, seizures,  Focal weakness, Loss of  consciousness,  Tremor,  depression,  and suicidal ideation.      Objective:  BP 118/68 (BP Location: Left Arm, Patient Position: Sitting, Cuff Size: Normal)   Pulse 85   Temp (!) 97.5 F (36.4 C) (Oral)   Resp 16   Ht 5\' 6"  (1.676 m)   Wt 136 lb 6.4 oz (61.9 kg)   SpO2 97%   BMI 22.02 kg/m   BP Readings from Last 3 Encounters:  10/19/18 118/68  07/13/18 (!) 154/88  06/18/18 132/86    Wt Readings from Last 3 Encounters:  10/19/18 136 lb 6.4 oz (61.9 kg)  07/13/18 137 lb 3.2 oz (62.2 kg)  06/18/18 136 lb 6.4 oz (61.9 kg)    General appearance: alert,  Anxious, cooperative and appears stated age Ears: normal TM's and external ear canals both ears Throat: lips, mucosa, and tongue normal; teeth and gums normal Neck: no adenopathy, no carotid bruit, supple, symmetrical, trachea midline and thyroid not enlarged, symmetric, no tenderness/mass/nodules Back: symmetric, no curvature. ROM normal. No CVA tenderness. Lungs: clear to auscultation bilaterally Heart: regular rate and rhythm, S1, S2 normal, no murmur, click, rub or gallop Abdomen: soft, non-tender; bowel sounds normal; no masses,  no organomegaly Pulses: 2+ and symmetric Skin: Skin color, texture, turgor normal. No rashes or lesions Lymph nodes: Cervical, supraclavicular, and axillary nodes normal.  No results found for: HGBA1C  Lab Results  Component Value Date   CREATININE 0.87 10/19/2018   CREATININE 0.82 06/18/2018   CREATININE 0.90 04/13/2018    Lab Results  Component Value Date   WBC 5.0 06/18/2018   HGB 14.6 06/18/2018   HCT 43.6 06/18/2018   PLT 203.0 06/18/2018   GLUCOSE 122 (H) 10/19/2018   CHOL 316 (H) 07/13/2018   TRIG 94.0 07/13/2018   HDL 75.60 07/13/2018   LDLDIRECT 172.6 09/08/2013   LDLCALC 221 (H) 07/13/2018   ALT 14 10/19/2018   AST 16 10/19/2018   NA 145 10/19/2018   K 3.9 10/19/2018   CL 106 10/19/2018   CREATININE 0.87 10/19/2018   BUN 21 10/19/2018   CO2 32 10/19/2018   TSH  4.19 06/18/2018    No results found.  Assessment & Plan:   Problem List Items Addressed This Visit    Symptoms, such as flushing, sleeplessness, headache, lack of concentration, associated with the menopause    Recurrent in the past;  symptoms historically have  improved with lexapro and again with zoloft.  Urged to resume  zoloft and rtc one month       Lumbago without sciatica    encouraged to strengthen low back muscles with PT through Dr Ammie Ferrier office       Relevant Orders   Ambulatory referral to Physical Therapy   Impingement syndrome of left shoulder region    S/p surgical decompression by Earnestine Leys,  Pain persists despite surgery,  PT. Pain is not severe but troublesome.  She has resumed meloxicam and I have recommended scheduled use of  tylenol  Caregiver with fatigue    She maintains a steady level of anxiety, fatigue, mild malaise that is aggravated by her husband's struggle with Parkinson's disease .  No changes to medications today except advised to resume zoloft.  Encouraged to employ more custodial/domestic care at home to lighten her burden      B12 deficiency - Primary    Treated with parenteral injections,  Now on oral supplements  Lab Results  Component Value Date   VITAMINB12 416 10/19/2018         Relevant Orders   Intrinsic Factor Antibodies   B12 (Completed)    Other Visit Diagnoses    Stress and adjustment reaction       Relevant Medications   sertraline (ZOLOFT) 25 MG tablet   Fatigue due to depression       Relevant Orders   Comprehensive metabolic panel (Completed)     A total of 40 minutes was spent with patient more than half of which was spent in counseling patient on the above mentioned issues , reviewing and explaining recent labs and imaging studies done, and coordination of care.  I am having Sidnie B. Glendening maintain her cholecalciferol, fluticasone, cyanocobalamin, folic acid, multivitamin with minerals, Omega-3 Fatty  Acids (OMEGA 3 PO), acetaminophen, Polyvinyl Alcohol-Povidone (REFRESH OP), mupirocin ointment, meloxicam, SYNTHROID, and sertraline.  Meds ordered this encounter  Medications  . sertraline (ZOLOFT) 25 MG tablet    Sig: Take 1 tablet (25 mg total) by mouth at bedtime.    Dispense:  30 tablet    Refill:  2    There are no discontinued medications.  Follow-up: Return in about 4 weeks (around 11/16/2018) for follow up on depression grief anxietyu back pain .   Crecencio Mc, MD

## 2018-10-20 DIAGNOSIS — M545 Low back pain, unspecified: Secondary | ICD-10-CM | POA: Insufficient documentation

## 2018-10-20 HISTORY — DX: Low back pain, unspecified: M54.50

## 2018-10-20 NOTE — Assessment & Plan Note (Signed)
" >>  ASSESSMENT AND PLAN FOR VASCULAR DEMENTIA (HCC) WRITTEN ON 10/20/2018 10:19 AM BY Javionna Leder L, MD  Will assess with MMSE at one month follow up after depression/anxiety have been treated.  "

## 2018-10-20 NOTE — Assessment & Plan Note (Signed)
She maintains a steady level of anxiety, fatigue, mild malaise that is aggravated by her husband's struggle with Parkinson's disease .  No changes to medications today except advised to resume zoloft.  Encouraged to employ more custodial/domestic care at home to lighten her burden

## 2018-10-20 NOTE — Assessment & Plan Note (Signed)
Will assess with MMSE at one month follow up after depression/anxiety have been treated.

## 2018-10-20 NOTE — Assessment & Plan Note (Signed)
Recurrent in the past;  symptoms historically have  improved with lexapro and again with zoloft.  Urged to resume  zoloft and rtc one month

## 2018-10-20 NOTE — Assessment & Plan Note (Signed)
encouraged to strengthen low back muscles with PT through Dr Ammie Ferrier office

## 2018-10-20 NOTE — Assessment & Plan Note (Signed)
S/p surgical decompression by Earnestine Leys,  Pain persists despite surgery,  PT. Pain is not severe but troublesome.  She has resumed meloxicam and I have recommended scheduled use of  tylenol

## 2018-10-20 NOTE — Assessment & Plan Note (Signed)
Treated with parenteral injections,  Now on oral supplements  Lab Results  Component Value Date   VITAMINB12 416 10/19/2018

## 2018-10-21 LAB — INTRINSIC FACTOR ANTIBODIES: Intrinsic Factor: NEGATIVE

## 2018-11-04 ENCOUNTER — Other Ambulatory Visit: Payer: Self-pay | Admitting: Internal Medicine

## 2018-11-16 ENCOUNTER — Ambulatory Visit: Payer: Medicare Other | Admitting: Internal Medicine

## 2018-11-23 ENCOUNTER — Ambulatory Visit: Payer: Medicare Other | Admitting: Internal Medicine

## 2019-01-31 IMAGING — US US ABDOMEN LIMITED
1 series · 14 of 25 positions shown · non-contrast
Comparison: 05/15/2015

CLINICAL DATA: Postprandial nausea, burping

EXAM:
US ABDOMEN LIMITED - RIGHT UPPER QUADRANT

[Series 1: us abdomen limited · 0.22mm/px · 14 of 51 slices shown]
[im 1/51]
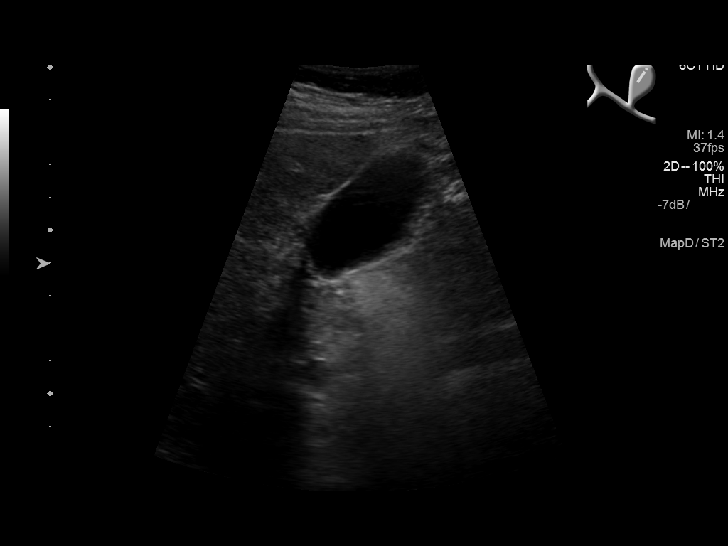
[im 5/51]
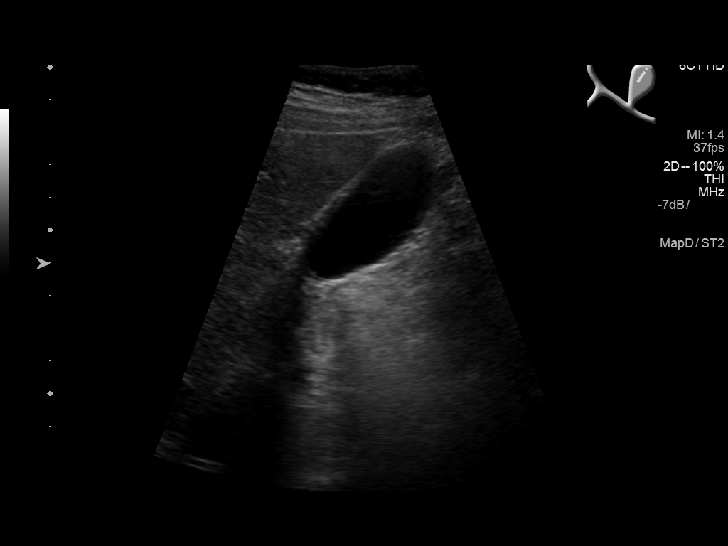
[im 9/51]
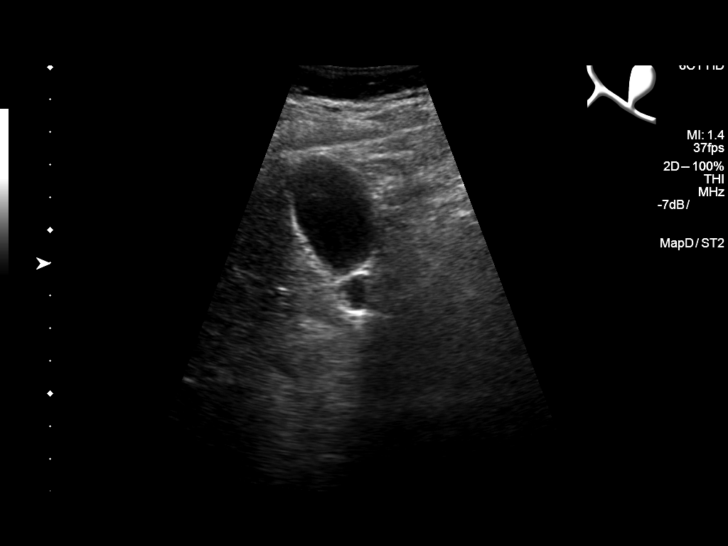
[im 13/51]
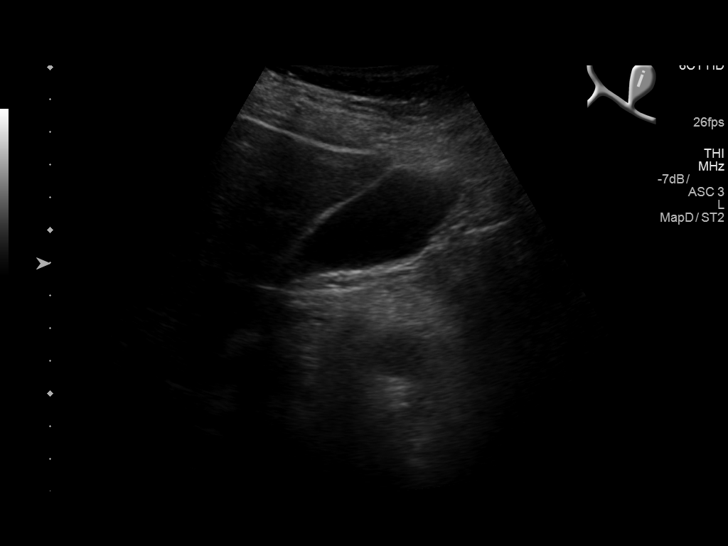
[im 17/51]
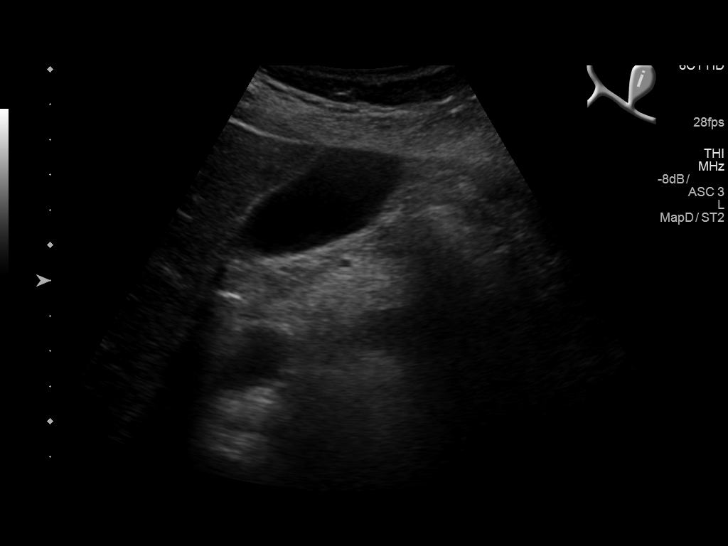
[im 19/51]
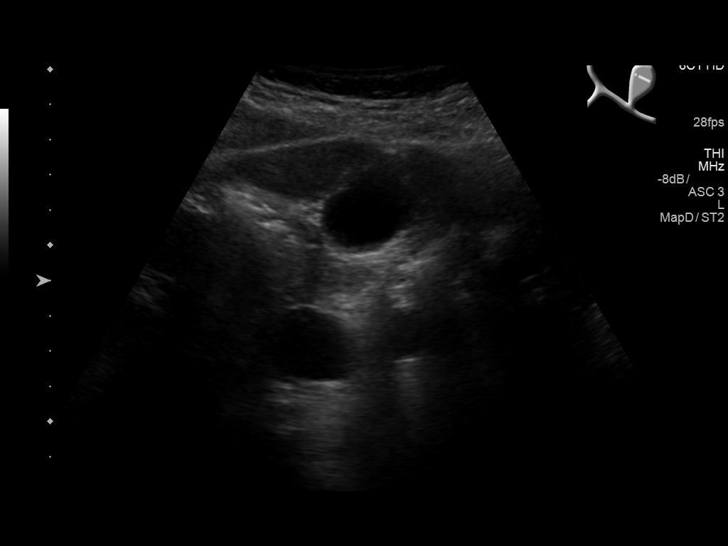
[im 23/51]
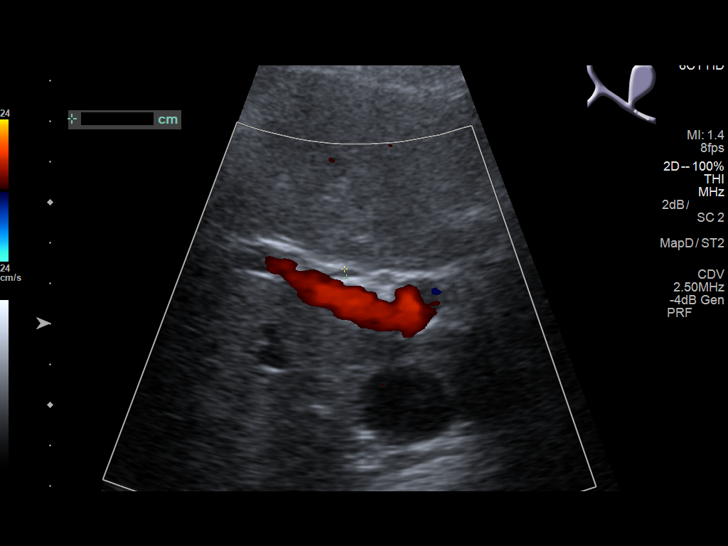
[im 28/51]
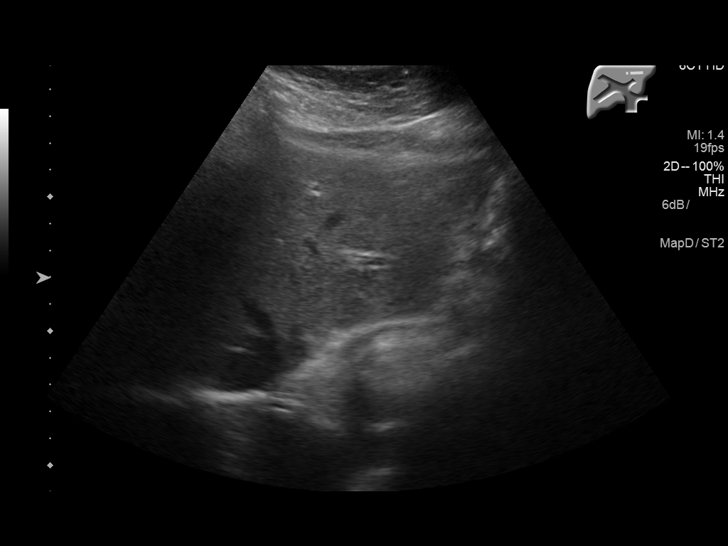
[im 32/51]
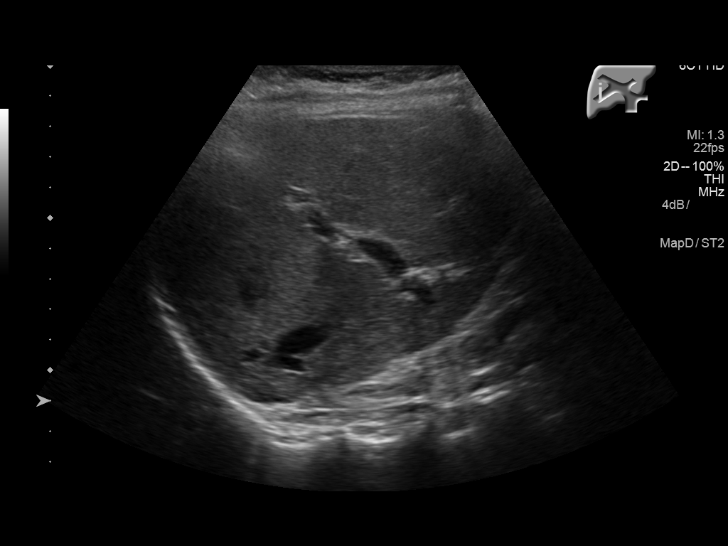
[im 34/51]
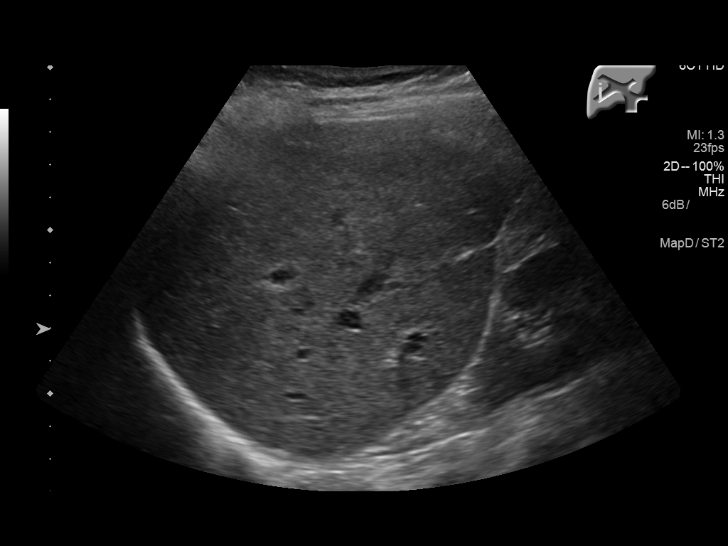
[im 38/51]
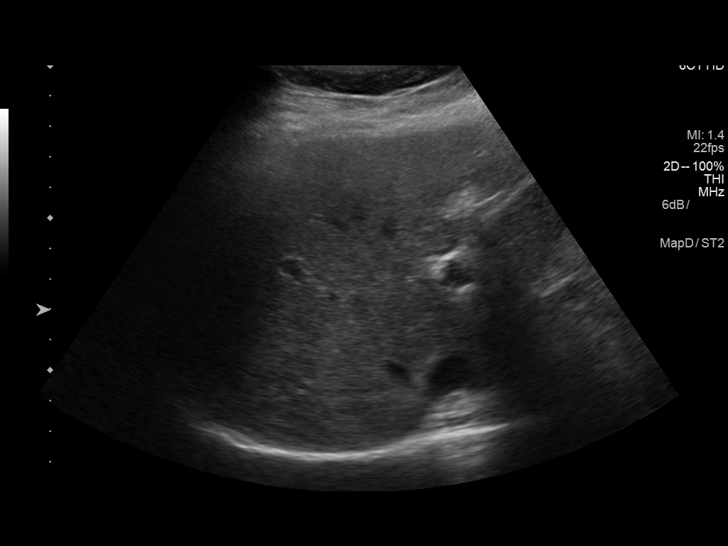
[im 42/51]
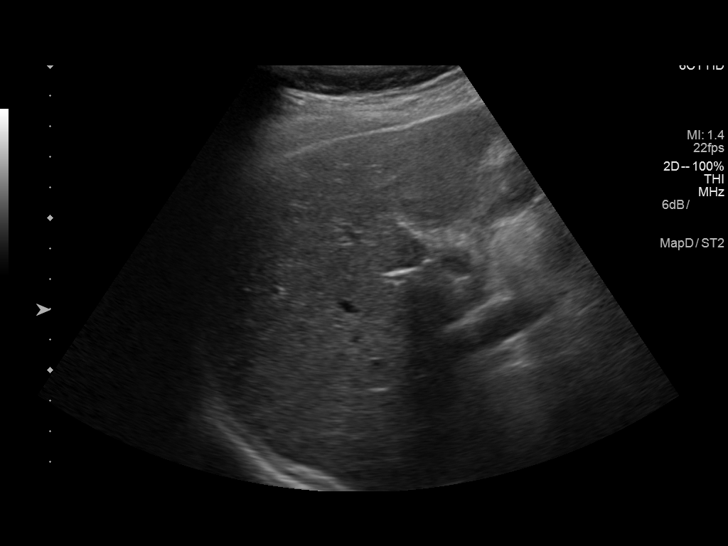
[im 46/51]
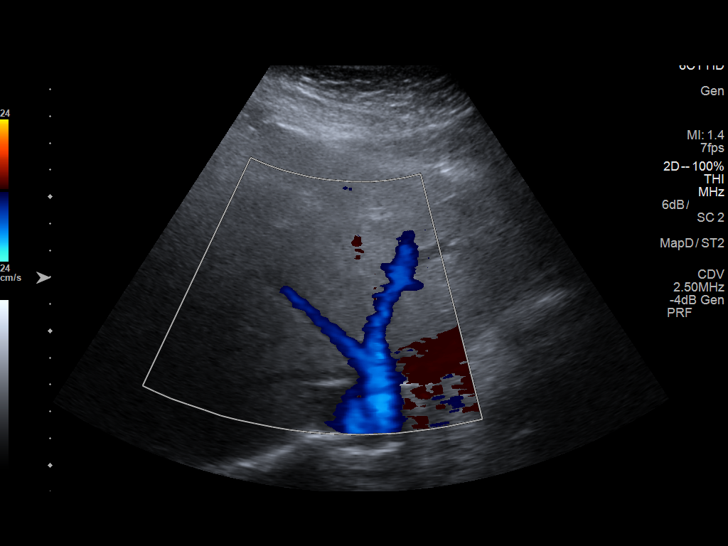
[im 51/51]
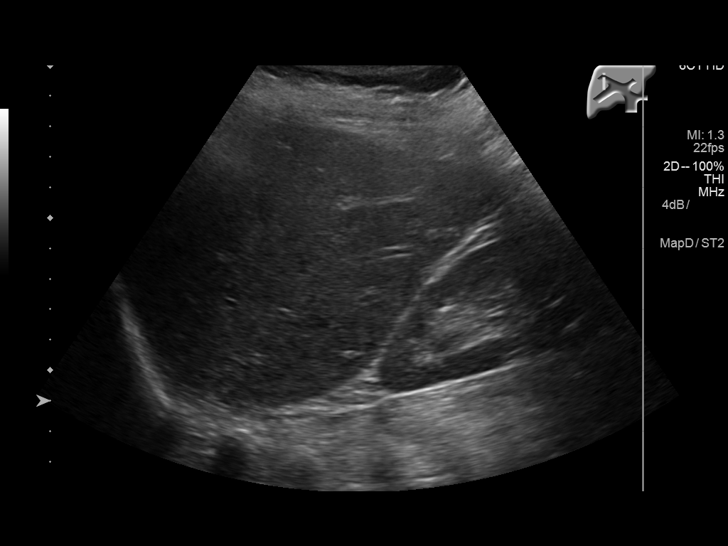

[14 of 25 positions shown; findings below may reference images not displayed]

FINDINGS: Gallbladder:

No gallstones or wall thickening visualized. No sonographic Murphy
sign noted by sonographer.

Common bile duct:

Diameter: Normal caliber, 2 mm

Liver:

No focal lesion identified. Within normal limits in parenchymal
echogenicity.
IMPRESSION: Unremarkable right upper quadrant ultrasound.

## 2019-02-08 ENCOUNTER — Other Ambulatory Visit: Payer: Self-pay | Admitting: Internal Medicine

## 2019-03-17 DIAGNOSIS — H353131 Nonexudative age-related macular degeneration, bilateral, early dry stage: Secondary | ICD-10-CM | POA: Diagnosis not present

## 2019-03-17 DIAGNOSIS — H02889 Meibomian gland dysfunction of unspecified eye, unspecified eyelid: Secondary | ICD-10-CM | POA: Diagnosis not present

## 2019-03-17 DIAGNOSIS — H0019 Chalazion unspecified eye, unspecified eyelid: Secondary | ICD-10-CM | POA: Diagnosis not present

## 2019-03-17 DIAGNOSIS — H2513 Age-related nuclear cataract, bilateral: Secondary | ICD-10-CM | POA: Diagnosis not present

## 2019-03-18 DIAGNOSIS — Z09 Encounter for follow-up examination after completed treatment for conditions other than malignant neoplasm: Secondary | ICD-10-CM | POA: Diagnosis not present

## 2019-03-18 DIAGNOSIS — D2262 Melanocytic nevi of left upper limb, including shoulder: Secondary | ICD-10-CM | POA: Diagnosis not present

## 2019-03-18 DIAGNOSIS — D2271 Melanocytic nevi of right lower limb, including hip: Secondary | ICD-10-CM | POA: Diagnosis not present

## 2019-03-18 DIAGNOSIS — D2272 Melanocytic nevi of left lower limb, including hip: Secondary | ICD-10-CM | POA: Diagnosis not present

## 2019-03-18 DIAGNOSIS — D2261 Melanocytic nevi of right upper limb, including shoulder: Secondary | ICD-10-CM | POA: Diagnosis not present

## 2019-03-18 DIAGNOSIS — D225 Melanocytic nevi of trunk: Secondary | ICD-10-CM | POA: Diagnosis not present

## 2019-03-18 DIAGNOSIS — Z08 Encounter for follow-up examination after completed treatment for malignant neoplasm: Secondary | ICD-10-CM | POA: Diagnosis not present

## 2019-03-18 DIAGNOSIS — L821 Other seborrheic keratosis: Secondary | ICD-10-CM | POA: Diagnosis not present

## 2019-03-18 DIAGNOSIS — Z85828 Personal history of other malignant neoplasm of skin: Secondary | ICD-10-CM | POA: Diagnosis not present

## 2019-03-18 DIAGNOSIS — Z85820 Personal history of malignant melanoma of skin: Secondary | ICD-10-CM | POA: Diagnosis not present

## 2019-03-18 DIAGNOSIS — Z872 Personal history of diseases of the skin and subcutaneous tissue: Secondary | ICD-10-CM | POA: Diagnosis not present

## 2019-03-25 ENCOUNTER — Other Ambulatory Visit: Payer: Self-pay

## 2019-03-25 ENCOUNTER — Ambulatory Visit (INDEPENDENT_AMBULATORY_CARE_PROVIDER_SITE_OTHER): Payer: Medicare Other | Admitting: Internal Medicine

## 2019-03-25 ENCOUNTER — Ambulatory Visit (INDEPENDENT_AMBULATORY_CARE_PROVIDER_SITE_OTHER): Payer: Medicare Other

## 2019-03-25 ENCOUNTER — Encounter: Payer: Self-pay | Admitting: Internal Medicine

## 2019-03-25 DIAGNOSIS — Z1231 Encounter for screening mammogram for malignant neoplasm of breast: Secondary | ICD-10-CM | POA: Diagnosis not present

## 2019-03-25 DIAGNOSIS — E034 Atrophy of thyroid (acquired): Secondary | ICD-10-CM

## 2019-03-25 DIAGNOSIS — Z0000 Encounter for general adult medical examination without abnormal findings: Secondary | ICD-10-CM

## 2019-03-25 NOTE — Progress Notes (Signed)
Subjective:   LILIA LETTERMAN is a 78 y.o. female who presents for Medicare Annual (Subsequent) preventive examination.  Review of Systems:  No ROS.  Medicare Wellness Virtual Visit.  Visual/audio telehealth visit, UTA vital signs.   See social history for additional risk factors.   Cardiac Risk Factors include: advanced age (>107men, >81 women)     Objective:     Vitals: There were no vitals taken for this visit.  There is no height or weight on file to calculate BMI.  Advanced Directives 03/25/2019 03/23/2018 09/08/2017 03/21/2017 03/21/2016  Does Patient Have a Medical Advance Directive? Yes Yes Yes Yes Yes  Type of Paramedic of [place];Living will [place];Living will - [place];Living will [place];Living will  Does patient want to make changes to medical advance directive? No - Patient declined No - Patient declined - No - Patient declined -  Copy of [place] in Chart? No - copy requested - - No - copy requested No - copy requested    Tobacco Social History   Tobacco Use  Smoking Status Never Smoker  Smokeless Tobacco Never Used     Counseling given: Not Answered   Clinical Intake:  Pre-visit preparation completed: Yes        Diabetes: No  How often do you need to have someone help you when you read instructions, pamphlets, or other written materials from your doctor or pharmacy?: 1 - Never  Interpreter Needed?: No     Past Medical History:  Diagnosis Date  . Arthritis   . Cancer (HCC)    SKIN CANCER-BASAL CELL  . GERD (gastroesophageal reflux disease)   . Hyperlipidemia   . Hypothyroidism   . Thyroid disease    Past Surgical History:  Procedure Laterality Date  . APPENDECTOMY    . CARPOMETACARPAL ([place]) FUSION OF THUMB Left 09/10/2017   Procedure: CARPOMETACARPAL [facility]) FUSION OF THUMB;  Surgeon: Earnestine Leys, MD;  Location: [facility] ORS;  Service:  Orthopedics;  Laterality: Left;  Marland Kitchen MELANOMA EXCISION     x 2, Dr. Sharlett Iles   Family History  Problem Relation Age of Onset  . Osteoporosis Mother        Deceased, 16  . Coronary artery disease Father 67       Deceased, 54 in MVA  . Heart Problems Father   . Arthritis Daughter 16       Rheumatiod  . Healthy Daughter    Social History   Socioeconomic History  . Marital status: Married    Spouse name: Not on file  . Number of children: Not on file  . Years of education: Not on file  . Highest education level: Not on file  Occupational History  . Not on file  Social Needs  . Financial resource strain: Not hard at all  . Food insecurity    Worry: Never true    Inability: Never true  . Transportation needs    Medical: No    Non-medical: No  Tobacco Use  . Smoking status: Never Smoker  . Smokeless tobacco: Never Used  Substance and Sexual Activity  . Alcohol use: Yes    Alcohol/week: 3.0 standard drinks    Types: 3 Standard drinks or equivalent per week    Comment: social  . Drug use: No  . Sexual activity: Yes  Lifestyle  . Physical activity    Days per week: Not on file    Minutes  per session: Not on file  . Stress: Not on file  Relationships  . Social Herbalist on phone: Not on file    Gets together: Not on file    Attends religious service: Not on file    Active member of club or organization: Not on file    Attends meetings of clubs or organizations: Not on file    Relationship status: Not on file  Other Topics Concern  . Not on file  Social History Narrative   She lives with husband (optometrist) who has Parkinson's disease.  They have two grown children.   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    Highest level of education:  Bachelor's    Outpatient Encounter Medications as of 03/25/2019  Medication Sig  . acetaminophen (TYLENOL) 325 MG tablet Take 325 mg by mouth every 6 (six) hours as needed for mild  pain or headache.  . cholecalciferol (VITAMIN D) 1000 UNITS tablet Take 1,000 Units by mouth daily.    . cyanocobalamin (,VITAMIN B-12,) 1000 MCG/ML injection INJECT 1 ML INTO THE MUSCLE EVERY 14 DAYS. FOR DAILY/WEEKLY/MONTHLY PER MD DIRECTIONS FOR B12 DEFICENCY.  . fluticasone (FLONASE) 50 MCG/ACT nasal spray Place 1 spray into both nostrils daily as needed for allergies or rhinitis.   . folic acid (FOLVITE) 465 MCG tablet Take 400 mcg by mouth daily.  . meloxicam (MOBIC) 7.5 MG tablet Take 1 tablet (7.5 mg total) by mouth daily. For shoulder pain  . Multiple Vitamin (MULTIVITAMIN WITH MINERALS) TABS tablet Take 1 tablet by mouth daily.  . mupirocin ointment (BACTROBAN) 2 % Place 1 application into the nose 2 (two) times daily.  . Omega-3 Fatty Acids (OMEGA 3 PO) Take 30 mLs by mouth daily. 800 mg of Fish Oil  per serving  . Polyvinyl Alcohol-Povidone (REFRESH OP) Apply 1 drop to eye daily as needed (dry eyes).  . sertraline (ZOLOFT) 25 MG tablet Take 1 tablet (25 mg total) by mouth at bedtime.  Marland Kitchen SYNTHROID 75 MCG tablet TAKE ONE TABLET ON AN EMPTY STOMACH WITHA GLASS OF WATER AT LEAST 30 TO 60 MINUTES BEFORE BREAKFAST   No facility-administered encounter medications on file as of 03/25/2019.     Activities of Daily Living In your present state of health, do you have any difficulty performing the following activities: 03/25/2019  Hearing? N  Vision? N  Difficulty concentrating or making decisions? N  Walking or climbing stairs? N  Dressing or bathing? N  Doing errands, shopping? N  Preparing Food and eating ? N  Using the Toilet? N  In the past six months, have you accidently leaked urine? N  Do you have problems with loss of bowel control? N  Managing your Medications? N  Managing your Finances? N  Housekeeping or managing your Housekeeping? N  Some recent data might be hidden    Patient Care Team: Crecencio Mc, MD as PCP - General (Internal Medicine)    Assessment:   This is  a routine wellness examination for [facility].  I connected with patient 03/25/19 at  9:30 AM EDT by an audio enabled telemedicine application and verified that I am speaking with the correct person using two identifiers. Patient stated full name and DOB. Patient gave permission to continue with virtual visit. Patient's location was at home and Nurse's location was at [place] office.   Health Screenings  Mammogram - 04/2017 Colonoscopy - 08/2004 Bone Density - 04/2017 Glaucoma -none  Hearing -demonstrates normal hearing during visit. Labs followed by pcp Dental- [facility]- visits within the last 12 months.  Social  Alcohol intake - yes      Smoking history- never   Smokers in home? none Illicit drug use? none Exercise - kick boxing with husband daily, 30 minutes Diet - regular Sexually Active -yes BMI- discussed the importance of a healthy diet, water intake and the benefits of aerobic exercise.  Educational material provided.   Safety  Patient feels safe at home- yes Patient does have smoke detectors at home- yes Patient does wear sunscreen or protective clothing when in direct sunlight -yes Patient does wear seat belt when in a moving vehicle -yes Patient drives- yes  [id number] precautions and sickness symptoms discussed.   Activities of Daily Living Patient denies needing assistance with: driving, household chores, feeding themselves, getting from bed to chair, getting to the toilet, bathing/showering, dressing, managing money, or preparing meals.  No new identified risk were noted.    Depression Screen Patient denies losing interest in daily life, feeling hopeless, or crying easily over simple problems.   Medication-taking as directed and without issues.   Fall Screen Patient denies being afraid of falling or falling in the last year.   Memory Screen Patient is alert.  Patient denies difficulty focusing, concentrating or misplacing items. Correctly identified the president of  the [place], season and recall 2/3. Patient likes to read for brain stimulation.  Immunizations The following Immunizations were discussed: Influenza, shingles, pneumonia, and tetanus.   Other Providers Patient Care Team: Crecencio Mc, MD as PCP - General (Internal Medicine)  Exercise Activities and Dietary recommendations Current Exercise Habits: Home exercise routine, Type of exercise: calisthenics(kick boxing), Time (Minutes): 30, Frequency (Times/Week): 5, Weekly Exercise (Minutes/Week): 150, Intensity: Mild  Goals      Patient Stated   . Increase physical activity (pt-stated)     Walk more for exercise       Fall Risk Fall Risk  03/25/2019 03/23/2018 03/21/2017 03/21/2016  Falls in the past year? 0 Yes Yes No  Number falls in past yr: - 1 1 -  Injury with Fall? - No Yes -  Comment - She lost her footing gardening.  She missed a step.  R foot fx in 3 places.  Followed Dr. Earnestine Leys. -  Follow up - Falls prevention discussed;Education provided Falls prevention discussed;Education provided -  Is the patient's home free of loose throw rugs in walkways, pet beds, electrical cords, etc? yes      Grab bars in the bathroom? yes      Handrails on the stairs? yes      Adequate lighting? yes  Depression Screen PHQ 2/9 Scores 03/25/2019 03/23/2018 03/21/2017 03/21/2016  PHQ - 2 Score 0 0 0 0     Cognitive Function MMSE - Mini Mental State Exam 03/23/2018 03/21/2017 03/21/2016  Orientation to time 5 5 5   Orientation to Place 5 5 5   Registration 3 3 3   Attention/ Calculation 5 5 5   Recall 3 3 3   Language- name 2 objects 2 2 2   Language- repeat 1 1 1   Language- follow 3 step command 3 3 3   Language- read & follow direction 1 1 1   Write a sentence 1 1 1   Copy design 1 1 1   Total score 30 30 30      6CIT Screen 03/25/2019  What Year? 0 points  What month? 0 points  What time? 0 points  Count back from  20 0 points  Months in reverse 0 points  Repeat phrase 0 points  Total Score  0    Immunization History  Administered Date(s) Administered  . Influenza Split 06/05/2011, 05/26/2012, 06/24/2013, 06/09/2014  . Influenza-Unspecified 05/17/2015, 05/16/2016, 05/13/2018, 06/08/2018  . PPD Test 04/17/2015  . Pneumococcal Conjugate-13 06/16/2014  . Pneumococcal Polysaccharide-23 07/11/2011, 04/14/2017  . Tdap 07/14/2012  . Zoster 07/16/2010  . Zoster Recombinat (Shingrix) 11/19/2017, 02/23/2018   Screening Tests Health Maintenance  Topic Date Due  . MAMMOGRAM  05/08/2018  . INFLUENZA VACCINE  03/27/2019  . Fecal DNA (Cologuard)  04/23/2020  . TETANUS/TDAP  07/14/2022  . DEXA SCAN  Completed  . PNA vac Low Risk Adult  Completed      Plan:    End of life planning; Advance aging; Advanced directives discussed.  Copy of current HCPOA/Living Will requested.    I have personally reviewed and noted the following in the patient's chart:   . Medical and social history . Use of alcohol, tobacco or illicit drugs  . Current medications and supplements . Functional ability and status . Nutritional status . Physical activity . Advanced directives . List of other physicians . Hospitalizations, surgeries, and ER visits in previous 12 months . Vitals . Screenings to include cognitive, depression, and falls . Referrals and appointments  In addition, I have reviewed and discussed with patient certain preventive protocols, quality metrics, and best practice recommendations. A written personalized care plan for preventive services as well as general preventive health recommendations were provided to patient.     Varney Biles, LPN  [id number]

## 2019-03-25 NOTE — Patient Instructions (Addendum)
  Samantha Hanna , Thank you for taking time to come for your Medicare Wellness Visit. I appreciate your ongoing commitment to your health goals. Please review the following plan we discussed and let me know if I can assist you in the future.   These are the goals we discussed: Goals      Patient Stated   . Increase physical activity (pt-stated)     Walk more for exercise       This is a list of the screening recommended for you and due dates:  Health Maintenance  Topic Date Due  . Mammogram  05/08/2018  . Flu Shot  03/27/2019  . Cologuard (Stool DNA test)  04/23/2020  . Tetanus Vaccine  07/14/2022  . DEXA scan (bone density measurement)  Completed  . Pneumonia vaccines  Completed

## 2019-03-25 NOTE — Progress Notes (Signed)
Telephone Note  This visit type was conducted due to national recommendations for restrictions regarding the COVID-19 pandemic (e.g. social distancing).  This format is felt to be most appropriate for this patient at this time.  All issues noted in this document were discussed and addressed.  No physical exam was performed (except for noted visual exam findings with Video Visits).   I connected with @ on 03/25/19 at 10:30 AM EDT by a video enabled telemedicine application or telephone and verified that I am speaking with the correct person using two identifiers. Location patient: home Location provider: work or home office Persons participating in the virtual visit: patient, provider  I discussed the limitations, risks, security and privacy concerns of performing an evaluation and management service by telephone and the availability of in person appointments. I also discussed with the patient that there may be a patient responsible charge related to this service. The patient expressed understanding and agreed to proceed.   Reason for visit:   HPI:  The patient has no signs or symptoms of COVID 19 infection (fever, cough, sore throat  or shortness of breath beyond what is typical for patient).  Patient denies contact with other persons with the above mentioned symptoms or with anyone confirmed to have COVID 19   She feels generally except for left hip pain that bothers her the most in the morning. The pain improves with activity.  She has had orthopedic evaluation In he past and told she had DJD>  She is not taking any medication regularly for the pain.   Taking sertraline for GAD. Takes it in the morning,  Sleeping well.   ROS: See pertinent positives and negatives per HPI.  Past Medical History:  Diagnosis Date  . Arthritis   . Cancer (HCC)    SKIN CANCER-BASAL CELL  . GERD (gastroesophageal reflux disease)   . Hyperlipidemia   . Hypothyroidism   . Thyroid disease     Past  Surgical History:  Procedure Laterality Date  . APPENDECTOMY    . CARPOMETACARPAL ([place]) FUSION OF THUMB Left 09/10/2017   Procedure: CARPOMETACARPAL [facility]) FUSION OF THUMB;  Surgeon: Earnestine Leys, MD;  Location: [facility] ORS;  Service: Orthopedics;  Laterality: Left;  Marland Kitchen MELANOMA EXCISION     x 2, Dr. Sharlett Iles    Family History  Problem Relation Age of Onset  . Osteoporosis Mother        Deceased, 61  . Coronary artery disease Father 15       Deceased, 62 in MVA  . Heart Problems Father   . Arthritis Daughter 30       Rheumatiod  . Healthy Daughter     SOCIAL HX:  reports that she has never smoked. She has never used smokeless tobacco. She reports current alcohol use of about 3.0 standard drinks of alcohol per week. She reports that she does not use drugs.   Current Outpatient Medications:  .  acetaminophen (TYLENOL) 325 MG tablet, Take 325 mg by mouth every 6 (six) hours as needed for mild pain or headache., Disp: , Rfl:  .  cholecalciferol (VITAMIN D) 1000 UNITS tablet, Take 1,000 Units by mouth daily.  , Disp: , Rfl:  .  fluticasone (FLONASE) 50 MCG/ACT nasal spray, Place 1 spray into both nostrils daily as needed for allergies or rhinitis. , Disp: , Rfl:  .  folic acid (FOLVITE) 619 MCG tablet, Take 400 mcg by mouth daily., Disp: , Rfl:  .  meloxicam (MOBIC) 7.5 MG tablet,  Take 1 tablet (7.5 mg total) by mouth daily. For shoulder pain, Disp: 30 tablet, Rfl: 11 .  Multiple Vitamin (MULTIVITAMIN WITH MINERALS) TABS tablet, Take 1 tablet by mouth daily., Disp: , Rfl:  .  Omega-3 Fatty Acids (OMEGA 3 PO), Take 30 mLs by mouth daily. 800 mg of Fish Oil  per serving, Disp: , Rfl:  .  Polyvinyl Alcohol-Povidone (REFRESH OP), Apply 1 drop to eye daily as needed (dry eyes)., Disp: , Rfl:  .  sertraline (ZOLOFT) 25 MG tablet, Take 1 tablet (25 mg total) by mouth at bedtime., Disp: 30 tablet, Rfl: 2 .  SYNTHROID 75 MCG tablet, TAKE ONE TABLET ON AN EMPTY STOMACH WITHA GLASS OF WATER AT LEAST  30 TO 60 MINUTES BEFORE BREAKFAST, Disp: 90 tablet, Rfl: 0 .  vitamin B-12 (CYANOCOBALAMIN) 1000 MCG tablet, Take 1,000 mcg by mouth daily., Disp: , Rfl:   EXAM:  VITALS per patient if applicable:  GENERAL: alert, oriented, appears well and in no acute distress  HEENT: atraumatic, conjunttiva clear, no obvious abnormalities on inspection of external nose and ears  NECK: normal movements of the head and neck  LUNGS: on inspection no signs of respiratory distress, breathing rate appears normal, no obvious gross SOB, gasping or wheezing  CV: no obvious cyanosis  MS: moves all visible extremities without noticeable abnormality  PSYCH/NEURO: pleasant and cooperative, no obvious depression or anxiety, speech and thought processing grossly intact  ASSESSMENT AND PLAN:  Hypothyroidism Thyroid function is due for assessment on current dose.    Lab Results  Component Value Date   TSH 4.19 06/18/2018       I discussed the assessment and treatment plan with the patient. The patient was provided an opportunity to ask questions and all were answered. The patient agreed with the plan and demonstrated an understanding of the instructions.   The patient was advised to call back or seek an in-person evaluation if the symptoms worsen or if the condition fails to improve as anticipated.  I provided 22 minutes of non-face-to-face time during this encounter.   Crecencio Mc, MD

## 2019-03-27 NOTE — Assessment & Plan Note (Signed)
Thyroid function is due for assessment on current dose.    Lab Results  Component Value Date   TSH 4.19 06/18/2018

## 2019-03-31 DIAGNOSIS — J019 Acute sinusitis, unspecified: Secondary | ICD-10-CM | POA: Diagnosis not present

## 2019-05-17 ENCOUNTER — Telehealth: Payer: Self-pay

## 2019-05-17 NOTE — Telephone Encounter (Signed)
Copied from [place] [id number]. Topic: General - Other >> May 17, 2019 10:54 AM Yvette Rack wrote: Reason for CRM: Pt daughter [name] stated she needs to speak with Dr. Derrel Nip regarding home health care. Cb# [phone]

## 2019-05-18 NOTE — Telephone Encounter (Signed)
[name] returning call to [place], requesting call back.

## 2019-05-18 NOTE — Telephone Encounter (Signed)
LMTCB

## 2019-05-20 NOTE — Telephone Encounter (Signed)
LMTCB

## 2019-05-22 ENCOUNTER — Other Ambulatory Visit: Payer: Self-pay | Admitting: Internal Medicine

## 2019-06-03 ENCOUNTER — Ambulatory Visit: Payer: Medicare Other

## 2019-06-05 DIAGNOSIS — Z23 Encounter for immunization: Secondary | ICD-10-CM | POA: Diagnosis not present

## 2019-07-21 ENCOUNTER — Other Ambulatory Visit: Payer: Self-pay

## 2019-08-16 ENCOUNTER — Telehealth: Payer: Self-pay

## 2019-08-16 ENCOUNTER — Other Ambulatory Visit: Payer: Self-pay

## 2019-08-16 ENCOUNTER — Other Ambulatory Visit: Payer: Self-pay | Admitting: Internal Medicine

## 2019-08-16 ENCOUNTER — Other Ambulatory Visit (INDEPENDENT_AMBULATORY_CARE_PROVIDER_SITE_OTHER): Payer: Medicare Other

## 2019-08-16 DIAGNOSIS — E039 Hypothyroidism, unspecified: Secondary | ICD-10-CM

## 2019-08-16 DIAGNOSIS — E785 Hyperlipidemia, unspecified: Secondary | ICD-10-CM | POA: Diagnosis not present

## 2019-08-16 MED ORDER — SYNTHROID 75 MCG PO TABS: 75.0000 ug | ORAL_TABLET | Freq: Every day | ORAL | 0 refills | Status: DC

## 2019-08-16 NOTE — Telephone Encounter (Signed)
Medication has been refilled and pt is coming in this afternoon to have lab work done.

## 2019-08-17 LAB — LIPID PANEL
Cholesterol: 245 mg/dL — ABNORMAL HIGH (ref 0–200)
HDL: 56.4 mg/dL (ref >39.00)
LDL Cholesterol: 169 mg/dL — ABNORMAL HIGH (ref 0–99)
NonHDL: 188.84
Total CHOL/HDL Ratio: 4
Triglycerides: 98 mg/dL (ref 0.0–149.0)
VLDL: 19.6 mg/dL (ref 0.0–40.0)

## 2019-08-17 LAB — COMPREHENSIVE METABOLIC PANEL
ALT: 14 U/L (ref 0–35)
AST: 15 U/L (ref 0–37)
Albumin: 4.3 g/dL (ref 3.5–5.2)
Alkaline Phosphatase: 54 U/L (ref 39–117)
BUN: 21 mg/dL (ref 6–23)
CO2: 30 mEq/L (ref 19–32)
Calcium: 9.3 mg/dL (ref 8.4–10.5)
Chloride: 105 mEq/L (ref 96–112)
Creatinine, Ser: 0.87 mg/dL (ref 0.40–1.20)
GFR: 63.28 mL/min (ref >60.00)
Glucose, Bld: 109 mg/dL — ABNORMAL HIGH (ref 70–99)
Potassium: 3.8 mEq/L (ref 3.5–5.1)
Sodium: 143 mEq/L (ref 135–145)
Total Bilirubin: 0.3 mg/dL (ref 0.2–1.2)
Total Protein: 6.4 g/dL (ref 6.0–8.3)

## 2019-08-17 LAB — TSH: TSH: 0.8 u[IU]/mL (ref 0.35–4.50)

## 2019-09-28 ENCOUNTER — Other Ambulatory Visit: Payer: Self-pay | Admitting: Internal Medicine

## 2019-10-29 ENCOUNTER — Other Ambulatory Visit: Payer: Self-pay | Admitting: Internal Medicine

## 2019-11-30 ENCOUNTER — Telehealth: Payer: Self-pay | Admitting: Internal Medicine

## 2019-11-30 NOTE — Telephone Encounter (Signed)
Spoken to patient she has been tested for Covid multiple times.All came back negative. Patient has had both vaccinations and no side effects. Patient stated she is very stopped u[p in her sinuses and ears are stopped up. She feels at random times when the pressure in her head causes her to be slightly dizzy then it will go away. Patient has taken tylenol for SX. No other medication taken for sx. SX have last for past two weeks. Patient is awaiting appointment with Dr Derrel Nip. She has had sx like this in the past.

## 2019-11-30 NOTE — Telephone Encounter (Signed)
Pt called and wanted an appt with Dr. Derrel Nip. She said her sinuses are stopped up. She is achy like she has the flu, She almost feel dizzy. This has been going on for about 2 weeks. I scheduled her a phone visit on 12/02/19 as this was the first available appt Dr. Derrel Nip had. Does she need to be triaged?

## 2019-11-30 NOTE — Telephone Encounter (Signed)
Phone visit 4/8 will suffice

## 2019-12-02 ENCOUNTER — Other Ambulatory Visit: Payer: Self-pay

## 2019-12-02 ENCOUNTER — Encounter: Payer: Self-pay | Admitting: Internal Medicine

## 2019-12-02 ENCOUNTER — Telehealth (INDEPENDENT_AMBULATORY_CARE_PROVIDER_SITE_OTHER): Payer: Medicare Other | Admitting: Internal Medicine

## 2019-12-02 DIAGNOSIS — J0100 Acute maxillary sinusitis, unspecified: Secondary | ICD-10-CM

## 2019-12-02 DIAGNOSIS — R4181 Age-related cognitive decline: Secondary | ICD-10-CM | POA: Diagnosis not present

## 2019-12-02 MED ORDER — AZITHROMYCIN 500 MG PO TABS: 500.0000 mg | ORAL_TABLET | Freq: Every day | ORAL | 0 refills | Status: DC

## 2019-12-02 MED ORDER — PHENYLEPHRINE-ACETAMINOPHEN 10-650 MG PO PACK: PACK | ORAL | 0 refills | Status: DC

## 2019-12-02 MED ORDER — PREDNISONE 10 MG PO TABS: ORAL_TABLET | ORAL | 0 refills | Status: DC

## 2019-12-02 NOTE — Progress Notes (Signed)
Telephone  Note  This visit type was conducted due to national recommendations for restrictions regarding the COVID-19 pandemic (e.g. social distancing).  This format is felt to be most appropriate for this patient at this time.  All issues noted in this document were discussed and addressed.  No physical exam was performed (except for noted visual exam findings with Video Visits).   I connected with@ on 12/02/19 at  8:00 AM EDT by  telephone and verified that I am speaking with the correct person using two identifiers. Location patient: home Location provider: work or home office Persons participating in the virtual visit: patient, provider  I discussed the limitations, risks, security and privacy concerns of performing an evaluation and management service by telephone and the availability of in person appointments. I also discussed with the patient that there may be a patient responsible charge related to this service. The patient expressed understanding and agreed to proceed.  Reason for visit: persistent sinus congestion   HPI:  79 yr old female with MCI , GAD presents with 4 week history of sinus congestion with facial pain or fevers ,  intermittent Rhinorrhea without bloody drainage .  No sneezing,  Eyes not itching.  No body aches,  But has chronic low back pain .  No known covid or flu exposures,  No cough.   Has not travelled.    ROS: See pertinent positives and negatives per HPI.  Past Medical History:  Diagnosis Date  . Arthritis   . Cancer (HCC)    SKIN CANCER-BASAL CELL  . GERD (gastroesophageal reflux disease)   . Hyperlipidemia   . Hypothyroidism   . Thyroid disease     Past Surgical History:  Procedure Laterality Date  . APPENDECTOMY    . CARPOMETACARPAL ([place]) FUSION OF THUMB Left 09/10/2017   Procedure: CARPOMETACARPAL [facility]) FUSION OF THUMB;  Surgeon: Earnestine Leys, MD;  Location: [facility] ORS;  Service: Orthopedics;  Laterality: Left;  Marland Kitchen MELANOMA EXCISION     x 2,  Dr. Sharlett Iles    Family History  Problem Relation Age of Onset  . Osteoporosis Mother        Deceased, 15  . Coronary artery disease Father 17       Deceased, 23 in MVA  . Heart Problems Father   . Arthritis Daughter 69       Rheumatiod  . Healthy Daughter     SOCIAL HX:  reports that she has never smoked. She has never used smokeless tobacco. She reports current alcohol use of about 3.0 standard drinks of alcohol per week. She reports that she does not use drugs.   Current Outpatient Medications:  .  acetaminophen (TYLENOL) 325 MG tablet, Take 325 mg by mouth every 6 (six) hours as needed for mild pain or headache., Disp: , Rfl:  .  fluticasone (FLONASE) 50 MCG/ACT nasal spray, Place 1 spray into both nostrils daily as needed for allergies or rhinitis. , Disp: , Rfl:  .  folic acid (FOLVITE) A999333 MCG tablet, Take 400 mcg by mouth daily., Disp: , Rfl:  .  meloxicam (MOBIC) 7.5 MG tablet, Take 1 tablet (7.5 mg total) by mouth daily. For shoulder pain, Disp: 30 tablet, Rfl: 11 .  Multiple Vitamin (MULTIVITAMIN WITH MINERALS) TABS tablet, Take 1 tablet by mouth daily., Disp: , Rfl:  .  Polyvinyl Alcohol-Povidone (REFRESH OP), Apply 1 drop to eye daily as needed (dry eyes)., Disp: , Rfl:  .  sertraline (ZOLOFT) 25 MG tablet, Take 1 tablet (  25 mg total) by mouth at bedtime., Disp: 30 tablet, Rfl: 2 .  SYNTHROID 75 MCG tablet, TAKE 1 TABLET EVERY DAY ON EMPTY STOMACHWITH A GLASS OF WATER AT LEAST 30-60 MINBEFORE BREAKFAST, Disp: 90 tablet, Rfl: 0 .  vitamin B-12 (CYANOCOBALAMIN) 1000 MCG tablet, Take 1,000 mcg by mouth daily., Disp: , Rfl:  .  azithromycin (ZITHROMAX) 500 MG tablet, Take 1 tablet (500 mg total) by mouth daily., Disp: 7 tablet, Rfl: 0 .  Omega-3 Fatty Acids (OMEGA 3 PO), Take 30 mLs by mouth daily. 800 mg of Fish Oil  per serving, Disp: , Rfl:  .  Phenylephrine-Acetaminophen 10-650 MG PACK, 1 tablet every 4 to 6 hours as needed for congestion, Disp: 56 each, Rfl: 0 .   predniSONE (DELTASONE) 10 MG tablet, 6 tablets on Day 1 , then reduce by 1 tablet daily until gone, Disp: 21 tablet, Rfl: 0  EXAM:  VITALS per patient if applicable:  GENERAL: alert, oriented, appears well and in no acute distress  HEENT: atraumatic, conjunttiva clear, no obvious abnormalities on inspection of external nose and ears  NECK: normal movements of the head and neck  LUNGS: on inspection no signs of respiratory distress, breathing rate appears normal, no obvious gross SOB, gasping or wheezing  CV: no obvious cyanosis  MS: moves all visible extremities without noticeable abnormality  PSYCH/NEURO: pleasant and cooperative, no obvious depression or anxiety, speech and thought processing grossly intact  ASSESSMENT AND PLAN:  Discussed the following assessment and plan:  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  She states that she is using a pill box for [name] and herself  Sinusitis Given chronicity of symptoms,   Will treat with empiric antibiotics, decongestants, and prednisone taper     I discussed the assessment and treatment plan with the patient. The patient was provided an opportunity to ask questions and all were answered. The patient agreed with the plan and demonstrated an understanding of the instructions.   The patient was advised to call back or seek an in-person evaluation if the symptoms worsen or if the condition fails to improve as anticipated.  I provided  20 minutes of non-face-to-face time during this encounter.   Crecencio Mc, MD

## 2019-12-02 NOTE — Assessment & Plan Note (Signed)
Given chronicity of symptoms,   Will treat with empiric antibiotics, decongestants, and prednisone taper

## 2019-12-02 NOTE — Assessment & Plan Note (Signed)
" >>  ASSESSMENT AND PLAN FOR VASCULAR DEMENTIA (HCC) WRITTEN ON 12/02/2019  8:37 AM BY MARYLYNN VERNEITA CROME, MD  She continues to defer home assistance except for an aide that comes once a week to help clean up.  She states that she is using a pill box for [name] and herself "

## 2019-12-02 NOTE — Assessment & Plan Note (Signed)
She continues to defer home assistance except for an aide that comes once a week to help clean up.  She states that she is using a pill box for [name] and herself

## 2019-12-22 DIAGNOSIS — L821 Other seborrheic keratosis: Secondary | ICD-10-CM | POA: Diagnosis not present

## 2019-12-22 DIAGNOSIS — Z09 Encounter for follow-up examination after completed treatment for conditions other than malignant neoplasm: Secondary | ICD-10-CM | POA: Diagnosis not present

## 2019-12-22 DIAGNOSIS — C44519 Basal cell carcinoma of skin of other part of trunk: Secondary | ICD-10-CM | POA: Diagnosis not present

## 2019-12-22 DIAGNOSIS — Z85828 Personal history of other malignant neoplasm of skin: Secondary | ICD-10-CM | POA: Diagnosis not present

## 2019-12-22 DIAGNOSIS — Z85820 Personal history of malignant melanoma of skin: Secondary | ICD-10-CM | POA: Diagnosis not present

## 2019-12-22 DIAGNOSIS — D485 Neoplasm of uncertain behavior of skin: Secondary | ICD-10-CM | POA: Diagnosis not present

## 2020-01-06 DIAGNOSIS — H02889 Meibomian gland dysfunction of unspecified eye, unspecified eyelid: Secondary | ICD-10-CM | POA: Diagnosis not present

## 2020-01-06 DIAGNOSIS — H2513 Age-related nuclear cataract, bilateral: Secondary | ICD-10-CM | POA: Diagnosis not present

## 2020-01-06 DIAGNOSIS — H353131 Nonexudative age-related macular degeneration, bilateral, early dry stage: Secondary | ICD-10-CM | POA: Diagnosis not present

## 2020-01-13 DIAGNOSIS — C44612 Basal cell carcinoma of skin of right upper limb, including shoulder: Secondary | ICD-10-CM | POA: Diagnosis not present

## 2020-02-04 ENCOUNTER — Other Ambulatory Visit: Payer: Self-pay | Admitting: Internal Medicine

## 2020-03-27 ENCOUNTER — Ambulatory Visit (INDEPENDENT_AMBULATORY_CARE_PROVIDER_SITE_OTHER): Payer: Medicare Other

## 2020-03-27 VITALS — Ht 66.0 in | Wt 136.0 lb

## 2020-03-27 DIAGNOSIS — Z0000 Encounter for general adult medical examination without abnormal findings: Secondary | ICD-10-CM | POA: Diagnosis not present

## 2020-03-27 NOTE — Patient Instructions (Addendum)
[name] , Thank you for taking time to come for your Medicare Wellness Visit. I appreciate your ongoing commitment to your health goals. Please review the following plan we discussed and let me know if I can assist you in the future.   These are the goals we discussed: Goals      Patient Stated   .  Increase physical activity (pt-stated)      Walk more for exercise       This is a list of the screening recommended for you and due dates:  Health Maintenance  Topic Date Due  .  Hepatitis C: One time screening is recommended by Center for Disease Control  (CDC) for  adults born from [birth year] through 1963.   Never done  . Mammogram  05/08/2018  . Flu Shot  03/26/2020  . Tetanus Vaccine  07/14/2022  . DEXA scan (bone density measurement)  Completed  . COVID-19 Vaccine  Completed  . Pneumonia vaccines  Completed    Immunizations Immunization History  Administered Date(s) Administered  . Influenza Split 06/05/2011, 05/26/2012, 06/24/2013, 06/09/2014  . Influenza, High Dose Seasonal PF 06/05/2019  . Influenza-Unspecified 05/17/2015, 05/16/2016, 05/13/2018, 06/08/2018  . PFIZER SARS-COV-2 Vaccination 09/16/2019, 10/07/2019  . PPD Test 04/17/2015  . Pneumococcal Conjugate-13 06/16/2014  . Pneumococcal Polysaccharide-23 07/11/2011, 04/14/2017  . Tdap 07/14/2012  . Zoster 07/16/2010  . Zoster Recombinat (Shingrix) 11/19/2017, 02/23/2018   Advanced directives: none new  Follow up in one year for your annual wellness visit  Schedule mammogram [phone]  Follow up 04/19/20 @ 9:00   Preventive Care 65 Years and Older, Female Preventive care refers to lifestyle choices and visits with your health care provider that can promote health and wellness. What does preventive care include?  A yearly physical exam. This is also called an annual well check.  Dental exams once or twice a year.  Routine eye exams. Ask your health care provider how often you should have your eyes  checked.  Personal lifestyle choices, including:  Daily care of your teeth and gums.  Regular physical activity.  Eating a healthy diet.  Avoiding tobacco and drug use.  Limiting alcohol use.  Practicing safe sex.  Taking low-dose aspirin every day.  Taking vitamin and mineral supplements as recommended by your health care provider. What happens during an annual well check? The services and screenings done by your health care provider during your annual well check will depend on your age, overall health, lifestyle risk factors, and family history of disease. Counseling  Your health care provider may ask you questions about your:  Alcohol use.  Tobacco use.  Drug use.  Emotional well-being.  Home and relationship well-being.  Sexual activity.  Eating habits.  History of falls.  Memory and ability to understand (cognition).  Work and work Statistician.  Reproductive health. Screening  You may have the following tests or measurements:  Height, weight, and BMI.  Blood pressure.  Lipid and cholesterol levels. These may be checked every 5 years, or more frequently if you are over 74 years old.  Skin check.  Lung cancer screening. You may have this screening every year starting at age 105 if you have a 30-pack-year history of smoking and currently smoke or have quit within the past 15 years.  Fecal occult blood test (FOBT) of the stool. You may have this test every year starting at age 23.  Flexible sigmoidoscopy or colonoscopy. You may have a sigmoidoscopy every 5 years or a colonoscopy every 10  years starting at age 35.  Hepatitis C blood test.  Hepatitis B blood test.  Sexually transmitted disease (STD) testing.  Diabetes screening. This is done by checking your blood sugar (glucose) after you have not eaten for a while (fasting). You may have this done every 1-3 years.  Bone density scan. This is done to screen for osteoporosis. You may have this done  starting at age 25.  Mammogram. This may be done every 1-2 years. Talk to your health care provider about how often you should have regular mammograms. Talk with your health care provider about your test results, treatment options, and if necessary, the need for more tests. Vaccines  Your health care provider may recommend certain vaccines, such as:  Influenza vaccine. This is recommended every year.  Tetanus, diphtheria, and acellular pertussis (Tdap, Td) vaccine. You may need a Td booster every 10 years.  Zoster vaccine. You may need this after age 68.  Pneumococcal 13-valent conjugate (PCV13) vaccine. One dose is recommended after age 68.  Pneumococcal polysaccharide (PPSV23) vaccine. One dose is recommended after age 32. Talk to your health care provider about which screenings and vaccines you need and how often you need them. This information is not intended to replace advice given to you by your health care provider. Make sure you discuss any questions you have with your health care provider. Document Released: 09/08/2015 Document Revised: 05/01/2016 Document Reviewed: 06/13/2015 Elsevier Interactive Patient Education  2017 [place] Prevention in the Home Falls can cause injuries. They can happen to people of all ages. There are many things you can do to make your home safe and to help prevent falls. What can I do on the outside of my home?  Regularly fix the edges of walkways and driveways and fix any cracks.  Remove anything that might make you trip as you walk through a door, such as a raised step or threshold.  Trim any bushes or trees on the path to your home.  Use bright outdoor lighting.  Clear any walking paths of anything that might make someone trip, such as rocks or tools.  Regularly check to see if handrails are loose or broken. Make sure that both sides of any steps have handrails.  Any raised decks and porches should have guardrails on the  edges.  Have any leaves, snow, or ice cleared regularly.  Use sand or salt on walking paths during winter.  Clean up any spills in your garage right away. This includes oil or grease spills. What can I do in the bathroom?  Use night lights.  Install grab bars by the toilet and in the tub and shower. Do not use towel bars as grab bars.  Use non-skid mats or decals in the tub or shower.  If you need to sit down in the shower, use a plastic, non-slip stool.  Keep the floor dry. Clean up any water that spills on the floor as soon as it happens.  Remove soap buildup in the tub or shower regularly.  Attach bath mats securely with double-sided non-slip rug tape.  Do not have throw rugs and other things on the floor that can make you trip. What can I do in the bedroom?  Use night lights.  Make sure that you have a light by your bed that is easy to reach.  Do not use any sheets or blankets that are too big for your bed. They should not hang down onto the floor.  Have  a firm chair that has side arms. You can use this for support while you get dressed.  Do not have throw rugs and other things on the floor that can make you trip. What can I do in the kitchen?  Clean up any spills right away.  Avoid walking on wet floors.  Keep items that you use a lot in easy-to-reach places.  If you need to reach something above you, use a strong step stool that has a grab bar.  Keep electrical cords out of the way.  Do not use floor polish or wax that makes floors slippery. If you must use wax, use non-skid floor wax.  Do not have throw rugs and other things on the floor that can make you trip. What can I do with my stairs?  Do not leave any items on the stairs.  Make sure that there are handrails on both sides of the stairs and use them. Fix handrails that are broken or loose. Make sure that handrails are as long as the stairways.  Check any carpeting to make sure that it is firmly  attached to the stairs. Fix any carpet that is loose or worn.  Avoid having throw rugs at the top or bottom of the stairs. If you do have throw rugs, attach them to the floor with carpet tape.  Make sure that you have a light switch at the top of the stairs and the bottom of the stairs. If you do not have them, ask someone to add them for you. What else can I do to help prevent falls?  Wear shoes that:  Do not have high heels.  Have rubber bottoms.  Are comfortable and fit you well.  Are closed at the toe. Do not wear sandals.  If you use a stepladder:  Make sure that it is fully opened. Do not climb a closed stepladder.  Make sure that both sides of the stepladder are locked into place.  Ask someone to hold it for you, if possible.  Clearly mark and make sure that you can see:  Any grab bars or handrails.  First and last steps.  Where the edge of each step is.  Use tools that help you move around (mobility aids) if they are needed. These include:  Canes.  Walkers.  Scooters.  Crutches.  Turn on the lights when you go into a dark area. Replace any light bulbs as soon as they burn out.  Set up your furniture so you have a clear path. Avoid moving your furniture around.  If any of your floors are uneven, fix them.  If there are any pets around you, be aware of where they are.  Review your medicines with your doctor. Some medicines can make you feel dizzy. This can increase your chance of falling. Ask your doctor what other things that you can do to help prevent falls. This information is not intended to replace advice given to you by your health care provider. Make sure you discuss any questions you have with your health care provider. Document Released: 06/08/2009 Document Revised: 01/18/2016 Document Reviewed: 09/16/2014 Elsevier Interactive Patient Education  2017 [employer].

## 2020-03-27 NOTE — Progress Notes (Addendum)
Subjective:   Samantha Hanna is a 79 y.o. female who presents for Medicare Annual (Subsequent) preventive examination.  Review of Systems    No ROS.  Medicare Wellness Virtual Visit.   Cardiac Risk Factors include: advanced age (>60men, >27 women)     Objective:    Today's Vitals   03/27/20 0935  Weight: 136 lb (61.7 kg)  Height: 5\' 6"  (1.676 m)   Body mass index is 21.95 kg/m.  Advanced Directives 03/27/2020 03/25/2019 03/23/2018 09/08/2017 03/21/2017 03/21/2016  Does Patient Have a Medical Advance Directive? Yes Yes Yes Yes Yes Yes  Type of Paramedic of [place];Living will [place];Living will [place];Living will - [place];Living will [place];Living will  Does patient want to make changes to medical advance directive? No - Patient declined No - Patient declined No - Patient declined - No - Patient declined -  Copy of [place] in Chart? No - copy requested No - copy requested - - No - copy requested No - copy requested    Current Medications (verified) Outpatient Encounter Medications as of 03/27/2020  Medication Sig   acetaminophen (TYLENOL) 325 MG tablet Take 325 mg by mouth every 6 (six) hours as needed for mild pain or headache.   fluticasone (FLONASE) 50 MCG/ACT nasal spray Place 1 spray into both nostrils daily as needed for allergies or rhinitis.    folic acid (FOLVITE) 427 MCG tablet Take 400 mcg by mouth daily.   meloxicam (MOBIC) 7.5 MG tablet Take 1 tablet (7.5 mg total) by mouth daily. For shoulder pain   Multiple Vitamin (MULTIVITAMIN WITH MINERALS) TABS tablet Take 1 tablet by mouth daily.   Omega-3 Fatty Acids (OMEGA 3 PO) Take 30 mLs by mouth daily. 800 mg of Fish Oil  per serving   Polyvinyl Alcohol-Povidone (REFRESH OP) Apply 1 drop to eye daily as needed (dry eyes).   SYNTHROID 75 MCG tablet TAKE 1 TABLET EVERY DAY ON EMPTY STOMACHWITH A  GLASS OF WATER AT LEAST 30-60 MINBEFORE BREAKFAST   vitamin B-12 (CYANOCOBALAMIN) 1000 MCG tablet Take 1,000 mcg by mouth daily.   Phenylephrine-Acetaminophen 10-650 MG PACK 1 tablet every 4 to 6 hours as needed for congestion   sertraline (ZOLOFT) 25 MG tablet Take 1 tablet (25 mg total) by mouth at bedtime. (Patient not taking: Reported on 03/27/2020)   [DISCONTINUED] azithromycin (ZITHROMAX) 500 MG tablet Take 1 tablet (500 mg total) by mouth daily.   [DISCONTINUED] predniSONE (DELTASONE) 10 MG tablet 6 tablets on Day 1 , then reduce by 1 tablet daily until gone   No facility-administered encounter medications on file as of 03/27/2020.    Allergies (verified) Hydrocodone, Shellfish allergy, Statins, and Penicillins   History: Past Medical History:  Diagnosis Date   Arthritis    Cancer ([place])    SKIN CANCER-BASAL CELL   GERD (gastroesophageal reflux disease)    Hyperlipidemia    Hypothyroidism    Thyroid disease    Past Surgical History:  Procedure Laterality Date   APPENDECTOMY     CARPOMETACARPAL ([place]) FUSION OF THUMB Left 09/10/2017   Procedure: CARPOMETACARPAL ([place]) FUSION OF THUMB;  Surgeon: Earnestine Leys, MD;  Location: [facility] ORS;  Service: Orthopedics;  Laterality: Left;   MELANOMA EXCISION     x 2, Dr. Sharlett Iles   Family History  Problem Relation Age of Onset   Osteoporosis Mother        Deceased, 12  Coronary artery disease Father 44       Deceased, 60 in MVA   Heart Problems Father    Arthritis Daughter 49       Rheumatiod   Healthy Daughter    Social History   Socioeconomic History   Marital status: Married    Spouse name: Not on file   Number of children: Not on file   Years of education: Not on file   Highest education level: Not on file  Occupational History   Not on file  Tobacco Use   Smoking status: Never Smoker   Smokeless tobacco: Never Used  Vaping Use   Vaping Use: Never used  Substance and Sexual Activity   Alcohol use: Yes     Alcohol/week: 3.0 standard drinks    Types: 3 Standard drinks or equivalent per week    Comment: social   Drug use: No   Sexual activity: Yes  Other Topics Concern   Not on file  Social History Narrative   She lives with husband (optometrist) who has Parkinson's disease.  They have two grown children.   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    Highest level of education:  Water quality scientist   Social Determinants of Health   Financial Resource Strain: Low Risk    Difficulty of Paying Living Expenses: Not hard at all  Food Insecurity: No Food Insecurity   Worried About Charity fundraiser in the Last Year: Never true   Arboriculturist in the Last Year: Never true  Transportation Needs: No Transportation Needs   Lack of Transportation (Medical): No   Lack of Transportation (Non-Medical): No  Physical Activity: Sufficiently Active   Days of Exercise per Week: 5 days   Minutes of Exercise per Session: 30 min  Stress: No Stress Concern Present   Feeling of Stress : Not at all  Social Connections: Unknown   Frequency of Communication with Friends and Family: More than three times a week   Frequency of Social Gatherings with Friends and Family: More than three times a week   Attends Religious Services: Not on Electrical engineer or Organizations: Yes   Attends Archivist Meetings: Not on file   Marital Status: Married    Tobacco Counseling Counseling given: Not Answered   Clinical Intake:  Pre-visit preparation completed: Yes        Diabetes: No  How often do you need to have someone help you when you read instructions, pamphlets, or other written materials from your doctor or pharmacy?: 1 - Never Interpreter Needed?: No      Activities of Daily Living In your present state of health, do you have any difficulty performing the following activities: 03/27/2020  Hearing? N  Vision? N  Difficulty concentrating or making  decisions? N  Walking or climbing stairs? N  Dressing or bathing? N  Doing errands, shopping? N  Preparing Food and eating ? N  Using the Toilet? N  In the past six months, have you accidently leaked urine? N  Do you have problems with loss of bowel control? N  Managing your Medications? N  Managing your Finances? N  Housekeeping or managing your Housekeeping? N  Some recent data might be hidden    Patient Care Team: Crecencio Mc, MD as PCP - General (Internal Medicine)  Indicate any recent Medical Services you may have received from other than [facility] providers in the  past year (date may be approximate).     Assessment:   This is a routine wellness examination for [facility].  I connected with [place] today by telephone and verified that I am speaking with the correct person using two identifiers. Location patient: home Location provider: work Persons participating in the virtual visit: patient, Marine scientist.    I discussed the limitations, risks, security and privacy concerns of performing an evaluation and management service by telephone and the availability of in person appointments. The patient expressed understanding and verbally consented to this telephonic visit.    Interactive audio and video telecommunications were attempted between this provider and patient, however failed, due to patient having technical difficulties OR patient did not have access to video capability.  We continued and completed visit with audio only.  Some vital signs may be absent or patient reported.   Hearing/Vision screen  Hearing Screening   125Hz  250Hz  500Hz  1000Hz  2000Hz  3000Hz  4000Hz  6000Hz  8000Hz   Right ear:           Left ear:           Comments: Patient is able to hear conversational tones without difficulty.  No issues reported.   Vision Screening Comments: Followed by Dr. Ellin Mayhew Wears corrective lenses Visual acuity not assessed, virtual visit.  They have seen their ophthalmologist in the last 12  months.     Dietary issues and exercise activities discussed: Current Exercise Habits: Home exercise routine, Type of exercise: walking, Time (Minutes): 30, Frequency (Times/Week): 5, Weekly Exercise (Minutes/Week): 150, Intensity: Mild  Goals       Patient Stated     Increase physical activity (pt-stated)      Walk more for exercise       Depression Screen PHQ 2/9 Scores 03/27/2020 03/27/2019 03/25/2019 03/23/2018 03/21/2017 03/21/2016 07/15/2012  PHQ - 2 Score 0 0 0 0 0 0 0  PHQ- 9 Score - 0 - - - - -    Fall Risk Fall Risk  03/27/2020 12/02/2019 07/21/2019 03/25/2019 03/23/2018  Falls in the past year? 0 0 0 0 Yes  Comment - - Emmi Telephone Survey: data to providers prior to load - -  Number falls in past yr: 0 - - - 1  Injury with Fall? 0 - - - No  Comment - - - - She lost her footing gardening.   Follow up Falls evaluation completed Falls evaluation completed - - Falls prevention discussed;Education provided    Handrails in use when climbing stairs? Yes  Home free of loose throw rugs in walkways, pet beds, electrical cords, etc? Yes  Adequate lighting in your home to reduce risk of falls? Yes   ASSISTIVE DEVICES UTILIZED TO PREVENT FALLS:  Life alert? No  Use of a cane, walker or w/c? No  Grab bars in the bathroom? Yes  Shower chair or bench in shower? Yes  Elevated toilet seat or a handicapped toilet? Yes   TIMED UP AND GO:  Was the test performed? No .   Cognitive Function: MMSE - Mini Mental State Exam 03/23/2018 03/21/2017 03/21/2016  Orientation to time 5 5 5   Orientation to Place 5 5 5   Registration 3 3 3   Attention/ Calculation 5 5 5   Recall 3 3 3   Language- name 2 objects 2 2 2   Language- repeat 1 1 1   Language- follow 3 step command 3 3 3   Language- read & follow direction 1 1 1   Write a sentence 1 1 1   Copy design 1  1 1  Total score 30 30 30      6CIT Screen 03/27/2020 03/25/2019  What Year? 0 points 0 points  What month? 0 points 0 points  What time? - 0  points  Count back from 20 - 0 points  Months in reverse 4 points 0 points  Repeat phrase 4 points 0 points  Total Score - 0    Immunizations Immunization History  Administered Date(s) Administered   Influenza Split 06/05/2011, 05/26/2012, 06/24/2013, 06/09/2014   Influenza, High Dose Seasonal PF 06/05/2019   Influenza-Unspecified 05/17/2015, 05/16/2016, 05/13/2018, 06/08/2018   PFIZER SARS-COV-2 Vaccination 09/16/2019, 10/07/2019   PPD Test 04/17/2015   Pneumococcal Conjugate-13 06/16/2014   Pneumococcal Polysaccharide-23 07/11/2011, 04/14/2017   Tdap 07/14/2012   Zoster 07/16/2010   Zoster Recombinat (Shingrix) 11/19/2017, 02/23/2018   Health Maintenance Health Maintenance  Topic Date Due   Hepatitis C Screening  Never done   MAMMOGRAM  05/08/2018   INFLUENZA VACCINE  03/26/2020   TETANUS/TDAP  07/14/2022   DEXA SCAN  Completed   COVID-19 Vaccine  Completed   PNA vac Low Risk Adult  Completed   Mammogram- ordered. Patient plans to schedule. Number provided [phone].  Hepatitis C Screening- deferred per patient preference  Dental Screening: Recommended annual dental exams for proper oral hygiene  Pill box in use to manage medications.    Community Resource Referral / Chronic Care Management: CRR required this visit?  No   CCM required this visit?  No      Plan:    Keep all routine maintenance appointments.   Follow up 04/19/20 @ 9:00; fasting  I have personally reviewed and noted the following in the patient's chart:   Medical and social history Use of alcohol, tobacco or illicit drugs  Current medications and supplements Functional ability and status Nutritional status Physical activity Advanced directives List of other physicians Hospitalizations, surgeries, and ER visits in previous 12 months Vitals Screenings to include cognitive, depression, and falls Referrals and appointments  In addition, I have reviewed and discussed with patient  certain preventive protocols, quality metrics, and best practice recommendations. A written personalized care plan for preventive services as well as general preventive health recommendations were provided to patient via mychart.     Samantha Hanna, Samantha Rada L, LPN   10/25/9189      I have reviewed the above information and agree with above.   Deborra Medina, MD

## 2020-04-12 ENCOUNTER — Telehealth: Payer: Self-pay | Admitting: Internal Medicine

## 2020-04-12 NOTE — Telephone Encounter (Signed)
Spoke with [name], patient's oldest daughter (who has medical POA)   About patient's level of cognitive decline.    Patient's husband the retired Dr Deon Pilling,  has end stage PD and is  declining rapidly. Because of his rapid decline he is reliant on  Syriana to manage his medications and meals.  According to Leonel Ramsay can no longer cook without burning the food,  she often forgets to feed him and forgets to give him his medications. Both parents according to [name] have lost significant weight due to Kanyla's inability to manage meals , in spite of daughters having food deliver.   The palliative care nurses for her father, and her father's physician (letvak) are very concerned that her cognitive decline has created a condition of unintentional neglect.   She refuses to allow [facility] paid caregiver into the house.  She throws away food delivered before feeding Dr Deon Pilling   I advised [name] to obtain General Guardianship to facilitate transitioning parents to [facility] so that both parents can have the assistance they need.   Aaren has an appt on August 25 a t 9 am with me and I will conference [name] in.

## 2020-04-12 NOTE — Telephone Encounter (Signed)
I CAN CALL DAUGHTER BACK AFTER MY LAST PATIENT TODAY  IT WILL BE AROUND 5:15 PLEASE SEND THIS BACK TO ME AFTER YOU CONTACT HER

## 2020-04-12 NOTE — Telephone Encounter (Signed)
Pt's daughter [name] called and is stating concern about pt's cognitive decline. Does not want pt to know that she has called. She states that she is not taking care of her father the way that he needs. She does not know how to administer medication to husband and daughter is very concerned. Please call back to advise. However; she will be unavailable tomorrow to move daughter into college. Please leave vm if calling tomorrow. [phone]. She is on pt's DPR.

## 2020-04-14 DIAGNOSIS — Z1231 Encounter for screening mammogram for malignant neoplasm of breast: Secondary | ICD-10-CM | POA: Diagnosis not present

## 2020-04-19 ENCOUNTER — Encounter: Payer: Self-pay | Admitting: Internal Medicine

## 2020-04-19 ENCOUNTER — Other Ambulatory Visit: Payer: Self-pay

## 2020-04-19 ENCOUNTER — Ambulatory Visit (INDEPENDENT_AMBULATORY_CARE_PROVIDER_SITE_OTHER): Payer: Medicare Other | Admitting: Internal Medicine

## 2020-04-19 VITALS — BP 140/82 | HR 76 | Temp 98.0°F | Resp 15 | Ht 66.0 in | Wt 127.6 lb

## 2020-04-19 DIAGNOSIS — R928 Other abnormal and inconclusive findings on diagnostic imaging of breast: Secondary | ICD-10-CM | POA: Diagnosis not present

## 2020-04-19 DIAGNOSIS — R634 Abnormal weight loss: Secondary | ICD-10-CM | POA: Diagnosis not present

## 2020-04-19 DIAGNOSIS — E538 Deficiency of other specified B group vitamins: Secondary | ICD-10-CM | POA: Diagnosis not present

## 2020-04-19 DIAGNOSIS — F0150 Vascular dementia without behavioral disturbance: Secondary | ICD-10-CM

## 2020-04-19 DIAGNOSIS — E034 Atrophy of thyroid (acquired): Secondary | ICD-10-CM

## 2020-04-19 LAB — CBC WITH DIFFERENTIAL/PLATELET
Basophils Absolute: 0 10*3/uL (ref 0.0–0.1)
Basophils Relative: 0.6 % (ref 0.0–3.0)
Eosinophils Absolute: 0 10*3/uL (ref 0.0–0.7)
Eosinophils Relative: 0.6 % (ref 0.0–5.0)
HCT: 39.8 % (ref 36.0–46.0)
Hemoglobin: 13.5 g/dL (ref 12.0–15.0)
Lymphocytes Relative: 21.1 % (ref 12.0–46.0)
Lymphs Abs: 1.1 10*3/uL (ref 0.7–4.0)
MCHC: 33.9 g/dL (ref 30.0–36.0)
MCV: 94.4 fl (ref 78.0–100.0)
Monocytes Absolute: 0.5 10*3/uL (ref 0.1–1.0)
Monocytes Relative: 9 % (ref 3.0–12.0)
Neutro Abs: 3.5 10*3/uL (ref 1.4–7.7)
Neutrophils Relative %: 68.7 % (ref 43.0–77.0)
Platelets: 181 10*3/uL (ref 150.0–400.0)
RBC: 4.22 Mil/uL (ref 3.87–5.11)
RDW: 13.5 % (ref 11.5–15.5)
WBC: 5.1 10*3/uL (ref 4.0–10.5)

## 2020-04-19 LAB — COMPREHENSIVE METABOLIC PANEL
ALT: 12 U/L (ref 0–35)
AST: 13 U/L (ref 0–37)
Albumin: 4.4 g/dL (ref 3.5–5.2)
Alkaline Phosphatase: 60 U/L (ref 39–117)
BUN: 14 mg/dL (ref 6–23)
CO2: 30 mEq/L (ref 19–32)
Calcium: 9.3 mg/dL (ref 8.4–10.5)
Chloride: 105 mEq/L (ref 96–112)
Creatinine, Ser: 0.72 mg/dL (ref 0.40–1.20)
GFR: 78.59 mL/min (ref >60.00)
Glucose, Bld: 97 mg/dL (ref 70–99)
Potassium: 4 mEq/L (ref 3.5–5.1)
Sodium: 143 mEq/L (ref 135–145)
Total Bilirubin: 0.7 mg/dL (ref 0.2–1.2)
Total Protein: 6.9 g/dL (ref 6.0–8.3)

## 2020-04-19 LAB — TSH: TSH: 0.63 u[IU]/mL (ref 0.35–4.50)

## 2020-04-19 LAB — VITAMIN B12: Vitamin B-12: 1460 pg/mL — ABNORMAL HIGH (ref 211–911)

## 2020-04-19 NOTE — Assessment & Plan Note (Signed)
Checking thyroid level today.

## 2020-04-19 NOTE — Assessment & Plan Note (Signed)
Reported by [facility].  She has not received a letter [facility] yet for additional images.  Advised daughter of findings as well [name])

## 2020-04-19 NOTE — Progress Notes (Signed)
Patient ID: Samantha Hanna, female    DOB: [birth year]  Age: 79 y.o. MRN: [id number]  The patient is here for annual follow up and  and management of other chronic and acute problems.    This visit occurred during the SARS-CoV-2 public health emergency.  Safety protocols were in place, including screening questions prior to the visit, additional usage of staff PPE, and extensive cleaning of exam room while observing appropriate contact time as indicated for disinfecting solutions.    The risk factors are reflected in the social history.  The roster of all physicians providing medical care to patient - is listed in the Snapshot section of the chart.  Activities of daily living:  The patient is 100% independent in all ADLs: dressing, toileting, feeding as well as independent mobility  Home safety : The patient has smoke detectors in the home. They wear seatbelts.  There are no firearms at home. There is no violence in the home.   There is no risks for hepatitis, STDs or HIV. There is no   history of blood transfusion. They have no travel history to infectious disease endemic areas of the world.  The patient states that she has seen their dentist in the last six month. She states that she has seen their eye doctor in the last year. She denies difficulty with regard to whispered voices and some television programs.  She has deferred audiologic testing in the last year.  They do not  have excessive sun exposure. Discussed the need for sun protection: hats, long sleeves and use of sunscreen if there is significant sun exposure.   Diet: the importance of a healthy diet is discussed. She has lost 10 lbs since her last visit. She states that she has a normal appetite and eats "three meals per day, just not a lot".  The benefits of regular aerobic exercise were discussed. She walks 4 times per week ,  20 minutes.   Depression screen: there are no signs or vegative symptoms of depression- irritability, change  in appetite, anhedonia, sadness/tearfullness.  Cognitive assessment: the patient cannot manage her  financial and personal affairs per daughter.    They could relate day,date,year and events; recalled 2/3 objects at 3 minutes; performed clock-face test normally.  The following portions of the patient's history were reviewed and updated as appropriate: allergies, current medications, past family history, past medical history,  past surgical history, past social history  and problem list.  Visual acuity was not assessed per patient preference since she has regular follow up with her ophthalmologist. Hearing and body mass index were assessed and reviewed.   During the course of the visit the patient was educated and counseled about appropriate screening and preventive services including : fall prevention , diabetes screening, nutrition counseling, colorectal cancer screening, and recommended immunizations.    CC: The primary encounter diagnosis was B12 deficiency. Diagnoses of Unintentional weight loss, Abnormal mammogram of left breast, Vascular dementia without behavioral disturbance ([place]), and Hypothyroidism due to acquired atrophy of thyroid were also pertinent to this visit.  Cognitive decline:  Patient's daughter Samantha Hanna was conferenced in after asking patient for permission to do so:   She states that it has been a difficult hectic .  Her husband , the retired Dr. Deon Pilling )  has had a difficult time due to progression of his Parkinson's Disease.  He is no longer able to walk without assistance , take his mediations or prepare meals . She has turned away several  home health agency aides but  States that her husband has a helper (privately paid)Samantha Hanna who comes at least 3 times per week to help. Per her daughter Samantha Hanna,  She throws away food that the family has had delivered,  Yet she and husband have lost considerable weight and her husband's physician, Dr Silvio Pate, is very concerned about  unintentionl neglect due to patient's loss of cognitive abilities impacting her husband's care.    History Samantha Hanna has a past medical history of Arthritis, Cancer ([place]), GERD (gastroesophageal reflux disease), Hyperlipidemia, Hypothyroidism, and Thyroid disease.   She has a past surgical history that includes Melanoma excision; Appendectomy; and Carpometacarpal ([facility]) fusion of thumb (Left, 09/10/2017).   Her family history includes Arthritis (age of onset: 11) in her daughter; Coronary artery disease (age of onset: 2) in her father; Healthy in her daughter; Heart Problems in her father; Osteoporosis in her mother.She reports that she has never smoked. She has never used smokeless tobacco. She reports current alcohol use of about 3.0 standard drinks of alcohol per week. She reports that she does not use drugs.  Outpatient Medications Prior to Visit  Medication Sig Dispense Refill  . acetaminophen (TYLENOL) 325 MG tablet Take 325 mg by mouth every 6 (six) hours as needed for mild pain or headache.    . fluticasone (FLONASE) 50 MCG/ACT nasal spray Place 1 spray into both nostrils daily as needed for allergies or rhinitis.     . folic acid (FOLVITE) 785 MCG tablet Take 400 mcg by mouth daily.    . meloxicam (MOBIC) 7.5 MG tablet Take 1 tablet (7.5 mg total) by mouth daily. For shoulder pain 30 tablet 11  . Multiple Vitamin (MULTIVITAMIN WITH MINERALS) TABS tablet Take 1 tablet by mouth daily.    . Omega-3 Fatty Acids (OMEGA 3 PO) Take 30 mLs by mouth daily. 800 mg of Fish Oil  per serving    . Phenylephrine-Acetaminophen 10-650 MG PACK 1 tablet every 4 to 6 hours as needed for congestion 56 each 0  . Polyvinyl Alcohol-Povidone (REFRESH OP) Apply 1 drop to eye daily as needed (dry eyes).    . sertraline (ZOLOFT) 25 MG tablet Take 1 tablet (25 mg total) by mouth at bedtime. 30 tablet 2  . SYNTHROID 75 MCG tablet TAKE 1 TABLET EVERY DAY ON EMPTY STOMACHWITH A GLASS OF WATER AT LEAST 30-60 MINBEFORE  BREAKFAST 90 tablet 0  . vitamin B-12 (CYANOCOBALAMIN) 1000 MCG tablet Take 1,000 mcg by mouth daily.     No facility-administered medications prior to visit.    Review of Systems   Patient denies headache, fevers, malaise, unintentional weight loss, skin rash, eye pain, sinus congestion and sinus pain, sore throat, dysphagia,  hemoptysis , cough, dyspnea, wheezing, chest pain, palpitations, orthopnea, edema, abdominal pain, nausea, melena, diarrhea, constipation, flank pain, dysuria, hematuria, urinary  Frequency, nocturia, numbness, tingling, seizures,  Focal weakness, Loss of consciousness,  Tremor, insomnia, depression, anxiety, and suicidal ideation.      Objective:  BP 140/82 (BP Location: Left Arm, Patient Position: Sitting, Cuff Size: Normal)   Pulse 76   Temp 98 F (36.7 C) (Oral)   Resp 15   Ht 5\' 6"  (1.676 m)   Wt 127 lb 9.6 oz (57.9 kg)   SpO2 97%   BMI 20.60 kg/m   Physical Exam  General appearance: alert, cooperative and appears stated age.  Thin ,  Tired appearing.  Ears: normal TM's and external ear canals both ears Throat: lips, mucosa, and  tongue normal; teeth and gums normal Neck: no adenopathy, no carotid bruit, supple, symmetrical, trachea midline and thyroid not enlarged, symmetric, no tenderness/mass/nodules Back: symmetric, no curvature. ROM normal. No CVA tenderness. Lungs: clear to auscultation bilaterally Heart: regular rate and rhythm, S1, S2 normal, no murmur, click, rub or gallop Abdomen: soft, non-tender; bowel sounds normal; no masses,  no organomegaly Pulses: 2+ and symmetric Skin: Skin color, texture, turgor normal. No rashes or lesions Lymph nodes: Cervical, supraclavicular, and axillary nodes normal. Neuro:  awake and interactive with normal mood and affect. Higher cortical functions were not assessed .  Speech is clear but she is having  word-finding difficulty . Extraocular movements are intact. Visual fields of both eyes are grossly intact.  Sensation to light touch is grossly intact bilaterally of upper and lower extremities. Motor examination shows 4+/5 symmetric hand grip and upper extremity and 5/5 lower extremity strength. There is no pronation or drift. Gait is non-ataxic     Assessment & Plan:   Problem List Items Addressed This Visit      Unprioritized   Abnormal mammogram of left breast    Reported by [facility].  She has not received a letter [facility] yet for additional images.  Advised daughter of findings as well Samantha Hanna)      B12 deficiency - Primary    She has been forgetting to take her B12 "until recently".  Checking level.        Relevant Orders   Vitamin B12   CBC with Differential/Platelet   Hypothyroidism    Checking thyroid level today       Vascular dementia [facility])    Unfortunately her dementia has progressed,  And she is unable to manage her husband's nutritional and medical needs.  Per daughter he has lost a considerable amount of weight. And patient has also lost 10 lbs.  Advised daughter Samantha Hanna that she should consider getting general guardianship if her mother continues to refuse to transition to assisted living, or at least  allow home health nursing/aides to assist them at home . Referring to Dr Manuella Ghazi for diagnosis.       Relevant Orders   Ambulatory referral to Neurology    Other Visit Diagnoses    Unintentional weight loss       Relevant Orders   Comprehensive metabolic panel   TSH      I am having Delphina B. Mysliwiec maintain her fluticasone, folic acid, multivitamin with minerals, Omega-3 Fatty Acids (OMEGA 3 PO), acetaminophen, Polyvinyl Alcohol-Povidone (REFRESH OP), meloxicam, sertraline, vitamin B-12, Phenylephrine-Acetaminophen, and Synthroid.  No orders of the defined types were placed in this encounter.   There are no discontinued medications.  Follow-up: Return in about 3 months (around 07/20/2020).   Crecencio Mc, MD

## 2020-04-19 NOTE — Patient Instructions (Signed)
You have lost a lot of weight!   I recommend that you and [name] STRONGLY consider transitioning to Assisted Living

## 2020-04-19 NOTE — Assessment & Plan Note (Signed)
" >>  ASSESSMENT AND PLAN FOR VASCULAR DEMENTIA (HCC) WRITTEN ON 04/19/2020  1:21 PM BY Junie Engram L, MD  Unfortunately her dementia has progressed,  And she is unable to manage her husband's nutritional and medical needs.  Per daughter he has lost a considerable amount of weight. And patient has also lost 10 lbs.  Advised daughter Mitzie that she should consider getting general guardianship if her mother continues to refuse to transition to assisted living, or at least  allow home health nursing/aides to assist them at home . Referring to Dr Maree for diagnosis.  "

## 2020-04-19 NOTE — Assessment & Plan Note (Addendum)
Unfortunately her dementia has progressed,  And she is unable to manage her husband's nutritional and medical needs.  Per daughter he has lost a considerable amount of weight. And patient has also lost 10 lbs.  Advised daughter [name] that she should consider getting general guardianship if her mother continues to refuse to transition to assisted living, or at least  allow home health nursing/aides to assist them at home . Referring to Dr Manuella Ghazi for diagnosis.

## 2020-04-19 NOTE — Assessment & Plan Note (Signed)
She has been forgetting to take her B12 "until recently".  Checking level.

## 2020-05-03 ENCOUNTER — Other Ambulatory Visit: Payer: Self-pay | Admitting: Internal Medicine

## 2020-05-03 DIAGNOSIS — R922 Inconclusive mammogram: Secondary | ICD-10-CM | POA: Diagnosis not present

## 2020-05-03 DIAGNOSIS — R928 Other abnormal and inconclusive findings on diagnostic imaging of breast: Secondary | ICD-10-CM | POA: Diagnosis not present

## 2020-05-03 LAB — HM MAMMOGRAPHY

## 2020-05-10 ENCOUNTER — Telehealth: Payer: Self-pay | Admitting: Internal Medicine

## 2020-05-10 NOTE — Telephone Encounter (Signed)
I spoke with pt she wants to hold off right now she says its a couple things going on right now.

## 2020-05-10 NOTE — Telephone Encounter (Signed)
The patient has dementia .  Her daughter [name] is actively pursuing legal guardianship because her father's care is suffering because Ms Waner won't let anyone help.  I have sent a mychart  message to [name] that she will need to [place] [facility] and take the patient to the appointment.  Can you forward the information to her via mychart?  Thank you

## 2020-05-10 NOTE — Telephone Encounter (Signed)
Rejection Reason - Patient Declined - patient would like to see her PCP first to determine if she needs to see neurology" [facility] said about 7 hours ago

## 2020-05-10 NOTE — Telephone Encounter (Signed)
Please let ms Salahuddin know that I am the one who ordered the neurology evaluation !!!

## 2020-05-11 NOTE — Telephone Encounter (Signed)
Good morning!  Oh wow! Sure I will send her the name of the office and the number.

## 2020-05-19 ENCOUNTER — Encounter: Payer: Self-pay | Admitting: Internal Medicine

## 2020-05-19 DIAGNOSIS — Z23 Encounter for immunization: Secondary | ICD-10-CM | POA: Diagnosis not present

## 2020-05-30 ENCOUNTER — Other Ambulatory Visit: Payer: Self-pay | Admitting: Unknown Physician Specialty

## 2020-05-30 DIAGNOSIS — J340 Abscess, furuncle and carbuncle of nose: Secondary | ICD-10-CM | POA: Diagnosis not present

## 2020-05-30 DIAGNOSIS — B079 Viral wart, unspecified: Secondary | ICD-10-CM | POA: Diagnosis not present

## 2020-06-01 LAB — SURGICAL PATHOLOGY

## 2020-07-19 DIAGNOSIS — D2261 Melanocytic nevi of right upper limb, including shoulder: Secondary | ICD-10-CM | POA: Diagnosis not present

## 2020-07-19 DIAGNOSIS — L821 Other seborrheic keratosis: Secondary | ICD-10-CM | POA: Diagnosis not present

## 2020-07-19 DIAGNOSIS — L570 Actinic keratosis: Secondary | ICD-10-CM | POA: Diagnosis not present

## 2020-07-19 DIAGNOSIS — C44519 Basal cell carcinoma of skin of other part of trunk: Secondary | ICD-10-CM | POA: Diagnosis not present

## 2020-07-19 DIAGNOSIS — D2262 Melanocytic nevi of left upper limb, including shoulder: Secondary | ICD-10-CM | POA: Diagnosis not present

## 2020-07-19 DIAGNOSIS — D2271 Melanocytic nevi of right lower limb, including hip: Secondary | ICD-10-CM | POA: Diagnosis not present

## 2020-07-19 DIAGNOSIS — D485 Neoplasm of uncertain behavior of skin: Secondary | ICD-10-CM | POA: Diagnosis not present

## 2020-07-19 DIAGNOSIS — X32XXXA Exposure to sunlight, initial encounter: Secondary | ICD-10-CM | POA: Diagnosis not present

## 2020-07-19 DIAGNOSIS — M71372 Other bursal cyst, left ankle and foot: Secondary | ICD-10-CM | POA: Diagnosis not present

## 2020-07-19 DIAGNOSIS — Z85820 Personal history of malignant melanoma of skin: Secondary | ICD-10-CM | POA: Diagnosis not present

## 2020-07-19 DIAGNOSIS — Z85828 Personal history of other malignant neoplasm of skin: Secondary | ICD-10-CM | POA: Diagnosis not present

## 2020-07-24 ENCOUNTER — Other Ambulatory Visit: Payer: Self-pay

## 2020-07-24 ENCOUNTER — Ambulatory Visit (INDEPENDENT_AMBULATORY_CARE_PROVIDER_SITE_OTHER): Payer: Medicare Other | Admitting: Internal Medicine

## 2020-07-24 ENCOUNTER — Encounter: Payer: Self-pay | Admitting: Internal Medicine

## 2020-07-24 VITALS — BP 140/82 | HR 66 | Temp 98.1°F | Resp 14 | Ht 66.0 in | Wt 129.0 lb

## 2020-07-24 DIAGNOSIS — R928 Other abnormal and inconclusive findings on diagnostic imaging of breast: Secondary | ICD-10-CM

## 2020-07-24 DIAGNOSIS — Z789 Other specified health status: Secondary | ICD-10-CM

## 2020-07-24 DIAGNOSIS — E034 Atrophy of thyroid (acquired): Secondary | ICD-10-CM | POA: Diagnosis not present

## 2020-07-24 DIAGNOSIS — F411 Generalized anxiety disorder: Secondary | ICD-10-CM

## 2020-07-24 DIAGNOSIS — N951 Menopausal and female climacteric states: Secondary | ICD-10-CM

## 2020-07-24 DIAGNOSIS — F0150 Vascular dementia without behavioral disturbance: Secondary | ICD-10-CM

## 2020-07-24 MED ORDER — SYNTHROID 75 MCG PO TABS
75.0000 ug | ORAL_TABLET | Freq: Every day | ORAL | 3 refills | Status: DC
Start: 2020-07-24 — End: 2021-08-15

## 2020-07-24 NOTE — Progress Notes (Signed)
Subjective:  Patient ID: Samantha Hanna, female    DOB: 03-Nov-1940  Age: 79 y.o. MRN: [id number]  CC: The primary encounter diagnosis was Hypothyroidism due to acquired atrophy of thyroid. Diagnoses of Statin intolerance, Vascular dementia without behavioral disturbance ([place]), Symptoms, such as flushing, sleeplessness, headache, lack of concentration, associated with the menopause, Anxiety state, and Abnormal mammogram of left breast were also pertinent to this visit.  HPI Samantha Hanna presents for follow up on vascular  Dementia with unintentional weight loss.   This visit occurred during the SARS-CoV-2 public health emergency.  Safety protocols were in place, including screening questions prior to the visit, additional usage of staff PPE, and extensive cleaning of exam room while observing appropriate contact time as indicated for disinfecting solutions.   Vascular dementia:  Still living at home with husband retired Dr Jethro Poling, who struggles with Parkinson's Disease.  Last seen in August , was referred to neurologist Dr.  Manuella Ghazi.  Patient apparently did not keep appt by choice because "there were so many appointments I made the choice to cancel the appointment."  I had advised daughter [name] after her last visit  to pursue general guardianship since her mother was neglecting her husband and herself and refused to consider transitioning to assisted living .  Patient has not lost any more weight.  Has an aide named Olegario Shearer that she has known for years that is now coming in for several hours DAILY .  Olegario Shearer is present at lunchtime and often dinner,  And  seems to be helping with husband mostly,   takes husband to the grocery store  Because patient "hates to shop."   She also has 2 aides that are coming in for a few hours per week as part of their long term home health care plan. Her grandsons do the yardwork  GAD: Patient is "not sure " if she is taking sertraline.   Refill history  Received form [facility]:  Not taking it.   She and Dr. Deon Pilling celebrated thanksgiving  At home with [name] her daughter, who lives in [place]. [name] is working full time and in graduate school.  Her grandsons are taking care of the yard.   [name] did not come home ; she is "moving back to [place]."    She states that her husband is "doing better " (not losing weight) and has a good appetite. She states that he is very determined to stay active.     Outpatient Medications Prior to Visit  Medication Sig Dispense Refill  . acetaminophen (TYLENOL) 325 MG tablet Take 325 mg by mouth every 6 (six) hours as needed for mild pain or headache.    . fluticasone (FLONASE) 50 MCG/ACT nasal spray Place 1 spray into both nostrils daily as needed for allergies or rhinitis.     . folic acid (FOLVITE) 622 MCG tablet Take 400 mcg by mouth daily.    . Multiple Vitamin (MULTIVITAMIN WITH MINERALS) TABS tablet Take 1 tablet by mouth daily.    . Omega-3 Fatty Acids (OMEGA 3 PO) Take 30 mLs by mouth daily. 800 mg of Fish Oil  per serving    . Polyvinyl Alcohol-Povidone (REFRESH OP) Apply 1 drop to eye daily as needed (dry eyes).    . vitamin B-12 (CYANOCOBALAMIN) 1000 MCG tablet Take 1,000 mcg by mouth daily.    Marland Kitchen SYNTHROID 75 MCG tablet TAKE 1 TABLET EVERY DAY ON EMPTY STOMACHWITH A GLASS OF WATER AT LEAST 30-60  MINBEFORE BREAKFAST 90 tablet 0  . meloxicam (MOBIC) 7.5 MG tablet Take 1 tablet (7.5 mg total) by mouth daily. For shoulder pain (Patient not taking: Reported on 07/24/2020) 30 tablet 11  . Phenylephrine-Acetaminophen 10-650 MG PACK 1 tablet every 4 to 6 hours as needed for congestion (Patient not taking: Reported on 07/24/2020) 56 each 0  . sertraline (ZOLOFT) 25 MG tablet Take 1 tablet (25 mg total) by mouth at bedtime. (Patient not taking: Reported on 07/24/2020) 30 tablet 2   No facility-administered medications prior to visit.    Review of Systems;  Patient denies headache, fevers, malaise, unintentional weight  loss, skin rash, eye pain, sinus congestion and sinus pain, sore throat, dysphagia,  hemoptysis , cough, dyspnea, wheezing, chest pain, palpitations, orthopnea, edema, abdominal pain, nausea, melena, diarrhea, constipation, flank pain, dysuria, hematuria, urinary  Frequency, nocturia, numbness, tingling, seizures,  Focal weakness, Loss of consciousness,  Tremor, insomnia, depression, anxiety, and suicidal ideation.      Objective:  BP 140/82 (BP Location: Left Arm, Patient Position: Sitting, Cuff Size: Normal)   Pulse 66   Temp 98.1 F (36.7 C) (Oral)   Resp 14   Ht 5\' 6"  (1.676 m)   Wt 129 lb (58.5 kg)   SpO2 98%   BMI 20.82 kg/m   BP Readings from Last 3 Encounters:  07/24/20 140/82  04/19/20 140/82  10/19/18 118/68    Wt Readings from Last 3 Encounters:  07/24/20 129 lb (58.5 kg)  04/19/20 127 lb 9.6 oz (57.9 kg)  03/27/20 136 lb (61.7 kg)    General appearance: alert, cooperative, well dressed and appears stated age Ears: normal TM's and external ear canals both ears Throat: lips, mucosa, and tongue normal; teeth and gums normal Neck: no adenopathy, no carotid bruit, supple, symmetrical, trachea midline and thyroid not enlarged, symmetric, no tenderness/mass/nodules Back: symmetric, no curvature. ROM normal. No CVA tenderness. Lungs: clear to auscultation bilaterally Heart: regular rate and rhythm, S1, S2 normal, no murmur, click, rub or gallop Abdomen: soft, non-tender; bowel sounds normal; no masses,  no organomegaly Pulses: 2+ and symmetric Skin: Skin color, texture, turgor normal. No rashes or lesions Lymph nodes: Cervical, supraclavicular, and axillary nodes normal. Neuro:  awake and interactive with normal mood and affect. Higher cortical functions are normal. Speech is clear without word-finding difficulty or dysarthria. Extraocular movements are intact. Visual fields of both eyes are grossly intact. Sensation to light touch is grossly intact bilaterally of upper and  lower extremities. Motor examination shows 4+/5 symmetric hand grip and upper extremity and 5/5 lower extremity strength. There is no pronation or drift. Gait is non-ataxic    No results found for: HGBA1C  Lab Results  Component Value Date   CREATININE 0.72 04/19/2020   CREATININE 0.87 08/16/2019   CREATININE 0.87 10/19/2018    Lab Results  Component Value Date   WBC 5.1 04/19/2020   HGB 13.5 04/19/2020   HCT 39.8 04/19/2020   PLT 181.0 04/19/2020   GLUCOSE 97 04/19/2020   CHOL 245 (H) 08/16/2019   TRIG 98.0 08/16/2019   HDL 56.40 08/16/2019   LDLDIRECT 172.6 09/08/2013   LDLCALC 169 (H) 08/16/2019   ALT 12 04/19/2020   AST 13 04/19/2020   NA 143 04/19/2020   K 4.0 04/19/2020   CL 105 04/19/2020   CREATININE 0.72 04/19/2020   BUN 14 04/19/2020   CO2 30 04/19/2020   TSH 0.63 04/19/2020    No results found.  Assessment & Plan:   Problem List  Items Addressed This Visit      Unprioritized   Vascular dementia [facility])    Her weight has stabilized and she is now allowing personal care home health aides in the home on a daily basis to assist her and husband [name] with meals,  Grocery shopping,  Etc.  . Referral  to Dr Manuella Ghazi was made at last visit but she has continually deferred the evaluation .       Symptoms, such as flushing, sleeplessness, headache, lack of concentration, associated with the menopause    She is no longer reporting these symptoms,  And has never taken the sertraline on a regular basis       Statin intolerance   Hypothyroidism - Primary   Relevant Medications   SYNTHROID 75 MCG tablet   Other Relevant Orders   Comprehensive metabolic panel   TSH   Lipid panel   Anxiety state    SSRI therapy has been deferred repeatedly by patient . She appears more calm today       Abnormal mammogram of left breast    Additional images were done at [facility] in September and normal.   Annual screening recommended in September 2022         I provided   30 minutes of  face-to-face time during this encounter reviewing patient's current problems and past surgeries, labs and imaging studies, providing counseling on the above mentioned problems , and coordination  of care . I have discontinued Jannine B. Hagarty's sertraline and Phenylephrine-Acetaminophen. I have also changed her Synthroid. Additionally, I am having her maintain her fluticasone, folic acid, multivitamin with minerals, Omega-3 Fatty Acids (OMEGA 3 PO), acetaminophen, Polyvinyl Alcohol-Povidone (REFRESH OP), meloxicam, and vitamin B-12.  Meds ordered this encounter  Medications  . SYNTHROID 75 MCG tablet    Sig: Take 1 tablet (75 mcg total) by mouth daily before breakfast.    Dispense:  90 tablet    Refill:  3    FOR NEXT FILL. THANK YOU    Medications Discontinued During This Encounter  Medication Reason  . Phenylephrine-Acetaminophen 10-650 MG PACK   . sertraline (ZOLOFT) 25 MG tablet   . SYNTHROID 75 MCG tablet Reorder    Follow-up: Return in about 3 months (around 10/23/2020).   Crecencio Mc, MD

## 2020-07-24 NOTE — Patient Instructions (Signed)
I'm glad you and Konrad Dolores are doing well   Your weight is finally stable.  (You were losing too much weight!)  I'll see you again in 3 months

## 2020-07-25 NOTE — Assessment & Plan Note (Signed)
She is no longer reporting these symptoms,  And has never taken the sertraline on a regular basis

## 2020-07-25 NOTE — Assessment & Plan Note (Signed)
SSRI therapy has been deferred repeatedly by patient . She appears more calm today

## 2020-07-25 NOTE — Assessment & Plan Note (Addendum)
Additional images were done at [facility] in September and normal.   Annual screening recommended in September 2022

## 2020-07-25 NOTE — Assessment & Plan Note (Signed)
Her weight has stabilized and she is now allowing personal care home health aides in the home on a daily basis to assist her and husband Konrad Dolores with meals,  Grocery shopping,  Etc.  . Referral  to Dr Manuella Ghazi was made at last visit but she has continually deferred the evaluation .

## 2020-07-25 NOTE — Assessment & Plan Note (Signed)
" >>  ASSESSMENT AND PLAN FOR VASCULAR DEMENTIA (HCC) WRITTEN ON 07/25/2020 10:39 AM BY Lakeisa Heninger L, MD  Her weight has stabilized and she is now allowing personal care home health aides in the home on a daily basis to assist her and husband [name] with meals,  Grocery shopping,  Etc.  . Referral  to Dr Maree was made at last visit but she has continually deferred the evaluation .  "

## 2020-07-26 DIAGNOSIS — C44519 Basal cell carcinoma of skin of other part of trunk: Secondary | ICD-10-CM | POA: Diagnosis not present

## 2020-10-04 DIAGNOSIS — H353131 Nonexudative age-related macular degeneration, bilateral, early dry stage: Secondary | ICD-10-CM | POA: Diagnosis not present

## 2020-10-04 DIAGNOSIS — H02889 Meibomian gland dysfunction of unspecified eye, unspecified eyelid: Secondary | ICD-10-CM | POA: Diagnosis not present

## 2020-10-04 DIAGNOSIS — H2513 Age-related nuclear cataract, bilateral: Secondary | ICD-10-CM | POA: Diagnosis not present

## 2020-10-25 ENCOUNTER — Ambulatory Visit: Payer: Medicare Other | Admitting: Internal Medicine

## 2020-10-27 DIAGNOSIS — T1511XA Foreign body in conjunctival sac, right eye, initial encounter: Secondary | ICD-10-CM | POA: Diagnosis not present

## 2020-11-22 ENCOUNTER — Telehealth: Payer: Self-pay

## 2020-11-22 NOTE — Telephone Encounter (Signed)
Pt's daughter [name] called and wants to speak with Dr Derrel Nip about worsening dementia. She is trying to get home health care. Pt's husband has moved to [facility] and she does not want pt home alone. She is on the wait list at [facility]. Please call her back at [phone]. [name] is on the [facility]

## 2020-11-23 NOTE — Telephone Encounter (Signed)
Samantha Hanna returned your call

## 2020-11-23 NOTE — Telephone Encounter (Signed)
LMTCB

## 2020-11-24 NOTE — Telephone Encounter (Signed)
LMTCB

## 2020-12-05 NOTE — Telephone Encounter (Signed)
Samantha Hanna called back returning your call

## 2020-12-05 NOTE — Telephone Encounter (Signed)
LMTCB

## 2020-12-06 DIAGNOSIS — H1045 Other chronic allergic conjunctivitis: Secondary | ICD-10-CM | POA: Diagnosis not present

## 2020-12-14 NOTE — Telephone Encounter (Signed)
LMTCB

## 2020-12-15 NOTE — Telephone Encounter (Signed)
Spoke with [name] to let her know that pt would need an appt to discuss the need for home health, or palliative care. [name] stated that the issue is that her mother does not think that she has dementia and refuses to go to any doctors appt because she knows she does not have dementia. However daughter stated that since the pt's husband has been moved to [facility] because he requires 24 hr care she doesn't feel comfortable that the pt is living alone. [name] stated that the pt's dementia is getting worse she is wandering around, forgetting who people are(her children), not eating, begging her husband to come home knowing he is not able. She also stated that the issue with home health is that the pt will not allow anyone in the home to help because she wouldn't when her husband first came home from the hospital, she was slamming the door in their faces. She was unable to take care of her husband so they placed him at [facility]. Daughter stated that she is working with a company called [employer], a company she used with her father, to hire the one and only person that pt will allow in the home. Th lady is a very close family friend that they used through this same company, however they are needing documentation from Dr. Derrel Nip stating that the pt has dementia and cognitively unable to live alone. [name] stated that she is working with that company to get the information faxed to our office. I suggested to the daughter that we could get social services involved and they could go out to the home and access the pt. The daughter already has the pt a place set at [facility] the pt is just refusing to leave her home. Daughter stated that she would like to hold off on contacting social services for now and see if she can get the help through [facility] first. I called pt and scheduled her a follow up appt with Dr. Derrel Nip on May 9th per the daughter's request.

## 2020-12-22 NOTE — Telephone Encounter (Signed)
Samantha Hanna called back and stated that she had spoken to Dr. Silvio Pate who is pt's husbands doctor. He stated to them that he may be able to pull some strings at [facility] so that the pt and husband can be in the same room in assisted living. This hopefully will make the pt more willing to move into [facility]. However the daughter stated that when the told her that she said "no way I'm not moving to [facility]".

## 2021-01-01 ENCOUNTER — Other Ambulatory Visit: Payer: Self-pay

## 2021-01-01 ENCOUNTER — Encounter: Payer: Self-pay | Admitting: Internal Medicine

## 2021-01-01 ENCOUNTER — Ambulatory Visit (INDEPENDENT_AMBULATORY_CARE_PROVIDER_SITE_OTHER): Payer: Medicare Other | Admitting: Internal Medicine

## 2021-01-01 DIAGNOSIS — R030 Elevated blood-pressure reading, without diagnosis of hypertension: Secondary | ICD-10-CM

## 2021-01-01 DIAGNOSIS — E034 Atrophy of thyroid (acquired): Secondary | ICD-10-CM | POA: Diagnosis not present

## 2021-01-01 DIAGNOSIS — R0981 Nasal congestion: Secondary | ICD-10-CM

## 2021-01-01 DIAGNOSIS — F0150 Vascular dementia without behavioral disturbance: Secondary | ICD-10-CM | POA: Diagnosis not present

## 2021-01-01 MED ORDER — FLUTICASONE PROPIONATE 50 MCG/ACT NA SUSP: 2.0000 | Freq: Every day | NASAL | 6 refills | Status: DC

## 2021-01-01 NOTE — Assessment & Plan Note (Signed)
" >>  ASSESSMENT AND PLAN FOR VASCULAR DEMENTIA (HCC) WRITTEN ON 01/02/2021  9:19 AM BY MARYLYNN BOYER L, MD  Progressive since 2019, presenting with depression and weight loss. Her weight has stabilized with increased supervision and assistance at home. It is my opinion that her health and nutritional status are now reliant upon the supervision and support of a personal aide, whose services would be required for a minimum of eight hours daily ..  I have strongly recommended that she reconsider her refusal to transition to [facility].  She has refused, however and has refused neurologic evaluation multiple times in the past. She requires assistance with meal preparation and transportation. Currently she states that she  is managing her own medications, but given her level of cognitive dysfunction I am recommending that medication administration be added  to the list of activities that require assistance.  CCM consult in progress   "

## 2021-01-01 NOTE — Assessment & Plan Note (Addendum)
Progressive since 2019, presenting with depression and weight loss. Her weight has stabilized with increased supervision and assistance at home. It is my opinion that her health and nutritional status are now reliant upon the supervision and support of a personal aide, whose services would be required for a minimum of eight hours daily ..  I have strongly recommended that she reconsider her refusal to transition to [facility].  She has refused, however and has refused neurologic evaluation multiple times in the past. She requires assistance with meal preparation and transportation. Currently she states that she  is managing her own medications, but given her level of cognitive dysfunction I am recommending that medication administration be added  to the list of activities that require assistance.  CCM consult in progress

## 2021-01-01 NOTE — Progress Notes (Signed)
Subjective:  Patient ID: Samantha Hanna, female    DOB: 02-Apr-1941  Age: 80 y.o. MRN: [id number]  CC: Diagnoses of Hypothyroidism due to acquired atrophy of thyroid, Vascular dementia without behavioral disturbance ([place]), Sinus congestion, and Elevated blood-pressure reading, without diagnosis of hypertension were pertinent to this visit.  HPI BETTINA WARN presents for follow up on vascular dementia.  Visit was requested by daughters,  who is concerned about her ability to remain at home safely without increased supervision secondary to cognitive changes due to dementia. Patient refuses to consider joining her husband at [facility], even though his physician has advised that [place] remain in A/L due to progression of his Parkinson's disease resulting in dysphagia and difficulty ambulating.   This visit occurred during the SARS-CoV-2 public health emergency.  Safety protocols were in place, including screening questions prior to the visit, additional usage of staff PPE, and extensive cleaning of exam room while observing appropriate contact time as indicated for disinfecting solutions.   Cc:  She reports mild sinus congestion for several weeks. Not taking anything for it except "sinus stuff" (determined to be saline spray)..  Reports occasional sneezing,  No rhinitis , headache or fevers.  No loss of smell or taste .  Patient has been living alone  Since mid March , when her husband was admitted to [facility] and discharged to [facility] for palliative care .  She has lost 7 lbs over the past year,  25 in the lst 4 yrs  BMI now 20   She is unable to provide any details about her husband's length of stay at [facility].  She repeatedly states that she is working on arranging staffing to bring hie home.  She has daily assistance  from a long term family friend nsamed  Vicky, who has been employed by family for the past 10 or more years.  She cannot quantify how many hours daily Vicky helps her, but states that  it is variable,  But that  Olegario Shearer comes at lunchtime and stays until dark.  Olegario Shearer provides transportation to [place] to see her husband. Grocery shops,  Meal preparation   (when she is not eating meals with Tommie  at [facility] , and house keeping. She admits that  "without Vicky I could not survive" .  Latoyna states that Olegario Shearer has several people whom she has recommended to work for [employer] to help facilitate her husband's 's return home who be able to provide 24 hour care.  She is not sure what these people are called or what agency they would be from    Correspondence from family indicates that Dr Deon Pilling does not think he can return home due to the level of care he needs and past  Negative experiences with home staffing.  Faydra admits that in the past she would not allow personnel other than Vicky in the home , but states that she did so "because they would sit on the couch and fall asleep") . She does acknowledge that Dr Silvio Pate has advised that  Konrad Dolores should  remain at [facility] and has offered to arrange a living situation that would allow them to live together. .   She cannot comment on  what her daughters think is best.  She is not willing to  move to [facility]. She does acknowledge that she does have dementia but states that she is not ready to leave her neighbors. States that she doesn't see them  that often now that she is alone.   She states that she continue to drive her care and managing the household finances.    Denies any difficulty  bc so many things are automatically drafted. 'I'm not as sharp as I used to be."   Correspondence from daughters states that she is no longer driving, but depends on Vicky or the daughters for transportation. [name] lives in [facility] and [name] is in [place].    Did not celebrate Mother's Day with family this year "family was off with their other family"    Weight loss discussed:  She continues to refer to all activities in the plural sense .  ("We") eat cereal or eggs.  3 meals  daily .  Eats afternoon meal with [name] at [facility] .  [name] has dysphagia,  Has a chopped diet.   Outpatient Medications Prior to Visit  Medication Sig Dispense Refill  . acetaminophen (TYLENOL) 325 MG tablet Take 325 mg by mouth every 6 (six) hours as needed for mild pain or headache.    . folic acid (FOLVITE) 678 MCG tablet Take 400 mcg by mouth daily.    . Multiple Vitamin (MULTIVITAMIN WITH MINERALS) TABS tablet Take 1 tablet by mouth daily.    . Omega-3 Fatty Acids (OMEGA 3 PO) Take 30 mLs by mouth daily. 800 mg of Fish Oil  per serving    . SYNTHROID 75 MCG tablet Take 1 tablet (75 mcg total) by mouth daily before breakfast. 90 tablet 3  . vitamin B-12 (CYANOCOBALAMIN) 1000 MCG tablet Take 1,000 mcg by mouth daily.    . fluticasone (FLONASE) 50 MCG/ACT nasal spray Place 1 spray into both nostrils daily as needed for allergies or rhinitis.     . meloxicam (MOBIC) 7.5 MG tablet Take 1 tablet (7.5 mg total) by mouth daily. For shoulder pain (Patient not taking: No sig reported) 30 tablet 11  . Polyvinyl Alcohol-Povidone (REFRESH OP) Apply 1 drop to eye daily as needed (dry eyes). (Patient not taking: Reported on 01/01/2021)     No facility-administered medications prior to visit.    Review of Systems;  Patient denies headache, fevers, malaise, unintentional weight loss, skin rash, eye pain, sinus  pain, sore throat, dysphagia,  hemoptysis , cough, dyspnea, wheezing, chest pain, palpitations, orthopnea, edema, abdominal pain, nausea, melena, diarrhea, constipation, flank pain, dysuria, hematuria, urinary  Frequency, nocturia, numbness, tingling, seizures,  Focal weakness, Loss of consciousness,  Tremor, insomnia, depression, anxiety, and suicidal ideation.      Objective:  BP (!) 158/78 (BP Location: Left Arm, Patient Position: Sitting)   Pulse 77   Temp 98 F (36.7 C)   Ht 5' 5.98" (1.676 m)   Wt 129 lb 3.2 oz (58.6 kg)   SpO2 95%   BMI 20.86 kg/m   BP Readings from Last 3  Encounters:  01/01/21 (!) 158/78  07/24/20 140/82  04/19/20 140/82    Wt Readings from Last 3 Encounters:  01/01/21 129 lb 3.2 oz (58.6 kg)  07/24/20 129 lb (58.5 kg)  04/19/20 127 lb 9.6 oz (57.9 kg)    General appearance: alert, cooperative and appears stated age Ears: normal TM's and external ear canals both ears Throat: lips, mucosa, and tongue normal; teeth and gums normal Neck: no adenopathy, no carotid bruit, supple, symmetrical, trachea midline and thyroid not enlarged, symmetric, no tenderness/mass/nodules Back: symmetric, no curvature. ROM normal. No CVA tenderness. Lungs: clear to auscultation bilaterally Heart: regular rate and rhythm, S1, S2 normal, no murmur,  click, rub or gallop Abdomen: soft, non-tender; bowel sounds normal; no masses,  no organomegaly Pulses: 2+ and symmetric Skin: Skin color, texture, turgor normal. No rashes or lesions Lymph nodes: Cervical, supraclavicular, and axillary nodes normal. Neuro:  awake and interactive with normal mood and affect. Higher cortical functions are impaired (see MMSE).  Speech is NOTABLE FOR word-finding difficulty without dysarthria. Extraocular movements are intact. Visual fields of both eyes are grossly intact. Sensation to light touch is grossly intact bilaterally of upper and lower extremities. Motor examination shows 4+/5 symmetric hand grip and upper extremity and 5/5 lower extremity strength. There is no pronation or drift. Gait is non-ataxic   Cognitive Testing MMSE - Mini Mental State Exam   Orientation to time 3 (missed month /day)  Orientation to Place 5  Registration 3  Attention/ Calculation 2  Recall 1  Language- name 2 objects 1  Language- repeat 1  Language- follow 3 step command 3  Language- read & follow direction 1  Write a sentence 1  Copy design 0  Total score 21           No results found for: HGBA1C  Lab Results  Component Value Date   CREATININE 0.72  04/19/2020   CREATININE 0.87 08/16/2019   CREATININE 0.87 10/19/2018    Lab Results  Component Value Date   WBC 5.1 04/19/2020   HGB 13.5 04/19/2020   HCT 39.8 04/19/2020   PLT 181.0 04/19/2020   GLUCOSE 97 04/19/2020   CHOL 245 (H) 08/16/2019   TRIG 98.0 08/16/2019   HDL 56.40 08/16/2019   LDLDIRECT 172.6 09/08/2013   LDLCALC 169 (H) 08/16/2019   ALT 12 04/19/2020   AST 13 04/19/2020   NA 143 04/19/2020   K 4.0 04/19/2020   CL 105 04/19/2020   CREATININE 0.72 04/19/2020   BUN 14 04/19/2020   CO2 30 04/19/2020   TSH 0.63 04/19/2020    No results found.  Assessment & Plan:   Problem List Items Addressed This Visit      Unprioritized   Hypothyroidism   Relevant Orders   Comprehensive metabolic panel   Lipid panel   TSH   Vascular dementia ([place])    Progressive since 2019, presenting with depression and weight loss. Her weight has stabilized with increased supervision and assistance at home. It is my opinion that her health and nutritional status are now reliant upon the supervision and support of a personal aide, whose services would be required for a minimum of eight hours daily ..  I have strongly recommended that she reconsider her refusal to transition to [facility].  She has refused, however and has refused neurologic evaluation multiple times in the past. She requires assistance with meal preparation and transportation. Currently she states that she  is managing her own medications, but given her level of cognitive dysfunction I am recommending that medication administration be added  to the list of activities that require assistance.  CCM consult in progress        Relevant Orders   Comprehensive metabolic panel   Lipid panel   Sinus congestion    Exam is suggestive of allergic rhinitis.  flonase prescribed      Elevated blood-pressure reading, without diagnosis of hypertension    She has no prior history of hypertension. She will not be able to remember to   Check blood pressure at home;  Will need to have aide /home health rn check her  blood pressure  over the next  3-4 weeks and to submit readings for evaluation.          I have discontinued Eleanor B. Grossi's fluticasone. I am also having her start on fluticasone. Additionally, I am having her maintain her folic acid, multivitamin with minerals, Omega-3 Fatty Acids (OMEGA 3 PO), acetaminophen, Polyvinyl Alcohol-Povidone (REFRESH OP), meloxicam, vitamin B-12, and Synthroid.  Meds ordered this encounter  Medications  . fluticasone (FLONASE) 50 MCG/ACT nasal spray    Sig: Place 2 sprays into both nostrils daily.    Dispense:  16 g    Refill:  6    Medications Discontinued During This Encounter  Medication Reason  . fluticasone (FLONASE) 50 MCG/ACT nasal spray     Follow-up: No follow-ups on file.   Crecencio Mc, MD

## 2021-01-01 NOTE — Patient Instructions (Addendum)
I am recommending that you try this nasal spray for your congestion:  Flonase ;  2 squirts in each nostril  DAILY

## 2021-01-02 ENCOUNTER — Encounter: Payer: Self-pay | Admitting: Internal Medicine

## 2021-01-02 ENCOUNTER — Other Ambulatory Visit (INDEPENDENT_AMBULATORY_CARE_PROVIDER_SITE_OTHER): Payer: Medicare Other

## 2021-01-02 DIAGNOSIS — F0150 Vascular dementia without behavioral disturbance: Secondary | ICD-10-CM

## 2021-01-02 DIAGNOSIS — R030 Elevated blood-pressure reading, without diagnosis of hypertension: Secondary | ICD-10-CM | POA: Insufficient documentation

## 2021-01-02 DIAGNOSIS — E034 Atrophy of thyroid (acquired): Secondary | ICD-10-CM | POA: Diagnosis not present

## 2021-01-02 DIAGNOSIS — R0981 Nasal congestion: Secondary | ICD-10-CM | POA: Insufficient documentation

## 2021-01-02 LAB — COMPREHENSIVE METABOLIC PANEL
ALT: 12 U/L (ref 0–35)
AST: 12 U/L (ref 0–37)
Albumin: 4.2 g/dL (ref 3.5–5.2)
Alkaline Phosphatase: 54 U/L (ref 39–117)
BUN: 23 mg/dL (ref 6–23)
CO2: 30 mEq/L (ref 19–32)
Calcium: 9.2 mg/dL (ref 8.4–10.5)
Chloride: 106 mEq/L (ref 96–112)
Creatinine, Ser: 0.69 mg/dL (ref 0.40–1.20)
GFR: 83.66 mL/min (ref >60.00)
Glucose, Bld: 96 mg/dL (ref 70–99)
Potassium: 4 mEq/L (ref 3.5–5.1)
Sodium: 142 mEq/L (ref 135–145)
Total Bilirubin: 0.6 mg/dL (ref 0.2–1.2)
Total Protein: 6.5 g/dL (ref 6.0–8.3)

## 2021-01-02 LAB — LIPID PANEL
Cholesterol: 254 mg/dL — ABNORMAL HIGH (ref 0–200)
HDL: 68.1 mg/dL (ref >39.00)
LDL Cholesterol: 171 mg/dL — ABNORMAL HIGH (ref 0–99)
NonHDL: 186.3
Total CHOL/HDL Ratio: 4
Triglycerides: 76 mg/dL (ref 0.0–149.0)
VLDL: 15.2 mg/dL (ref 0.0–40.0)

## 2021-01-02 LAB — TSH: TSH: 2.6 u[IU]/mL (ref 0.35–4.50)

## 2021-01-02 NOTE — Assessment & Plan Note (Signed)
She has no prior history of hypertension. She will not be able to remember to  Check blood pressure at home;  Will need to have aide /home health rn check her  blood pressure  over the next 3-4 weeks and to submit readings for evaluation.

## 2021-01-02 NOTE — Assessment & Plan Note (Signed)
Exam is suggestive of allergic rhinitis.  flonase prescribed

## 2021-01-03 ENCOUNTER — Telehealth: Payer: Self-pay

## 2021-01-03 NOTE — Chronic Care Management (AMB) (Signed)
  Chronic Care Management   Note  01/03/2021 Name: [name] MRN: [id number] DOB: 1940-11-10  Samantha Hanna is a 80 y.o. year old female who is a primary care patient of Derrel Nip, Aris Everts, MD. I reached out to Samantha Hanna by phone today in response to a referral sent by [name] PCP, Crecencio Mc, MD     [name] was given information about Chronic Care Management services today including:  1. CCM service includes personalized support from designated clinical staff supervised by her physician, including individualized plan of care and coordination with other care providers 2. 24/7 contact phone numbers for assistance for urgent and routine care needs. 3. Service will only be billed when office clinical staff spend 20 minutes or more in a month to coordinate care. 4. Only one practitioner may furnish and bill the service in a calendar month. 5. The patient may stop CCM services at any time (effective at the end of the month) by phone call to the office staff. 6. The patient will be responsible for cost sharing (co-pay) of up to 20% of the service fee (after annual deductible is met).  Patient agreed to services and verbal consent obtained.   Follow up plan: Telephone appointment with care management team member scheduled for:01/10/2021  Noreene Larsson, [place], [place], [place] [address] Direct Dial: [phone] [email or web address]: [email or web address]

## 2021-01-05 ENCOUNTER — Other Ambulatory Visit: Payer: Self-pay | Admitting: Internal Medicine

## 2021-01-05 DIAGNOSIS — E785 Hyperlipidemia, unspecified: Secondary | ICD-10-CM | POA: Insufficient documentation

## 2021-01-05 DIAGNOSIS — E782 Mixed hyperlipidemia: Secondary | ICD-10-CM

## 2021-01-05 MED ORDER — EZETIMIBE 10 MG PO TABS: 10.0000 mg | ORAL_TABLET | Freq: Every day | ORAL | 3 refills | Status: DC

## 2021-01-05 NOTE — Assessment & Plan Note (Signed)
Statin intolerant.  zetia trial to commence.

## 2021-01-10 ENCOUNTER — Telehealth: Payer: Self-pay | Admitting: *Deleted

## 2021-01-10 ENCOUNTER — Telehealth: Payer: Medicare Other

## 2021-01-10 NOTE — Telephone Encounter (Signed)
  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 Unsuccessful Initial Outreach Call Attempt.  The patient was referred to the case management team for assistance with care management and care coordination.  LCSW made several attempts to try and contact patient today to perform the initial phone assessment, but without success.  Patient was unavailable at the time of LCSW's calls, and LCSW was unable to leave HIPAA compliant messages on voicemail, as mailbox is full.  LCSW has requested that the [place] contact patient to try and reschedule the initial telephone outreach call.  LCSW will make a second outreach attempt to patient within the next 10 business days.   Follow Up Plan: If patient returns call to provider office, please advise to call LCSW at # [phone].  Nat Christen LCSW Licensed Clinical Social Worker [place]  [phone]

## 2021-01-12 ENCOUNTER — Telehealth: Payer: Self-pay

## 2021-01-12 NOTE — Chronic Care Management (AMB) (Signed)
  Care Management   Note  01/12/2021 Name: Samantha Hanna MRN: [id number] DOB: 01-16-1941  Samantha Hanna is a 80 y.o. year old female who is a primary care patient of Derrel Nip, Aris Everts, MD and is actively engaged with the care management team. I reached out to Samantha Hanna by phone today to assist with re-scheduling an initial visit with the Licensed Clinical Social Worker  Follow up plan: Unsuccessful telephone outreach attempt made.The care management team will reach out to the patient again over the next 7 days.  If patient returns call to provider office, please advise to call [facility]  at [place], [place], [place], [place] [address] Direct Dial: [phone] [email or web address]: [email or web address]

## 2021-01-16 NOTE — Chronic Care Management (AMB) (Signed)
  Care Management   Note  01/16/2021 Name: Samantha Hanna MRN: [id number] DOB: June 06, 1941  Samantha Hanna is a 80 y.o. year old female who is a primary care patient of Derrel Nip, Aris Everts, MD and is actively engaged with the care management team. I reached out to Samantha Hanna by phone today to assist with re-scheduling an initial visit with the Licensed Clinical Social Worker  Follow up plan: Unsuccessful telephone outreach attempt made. A HIPAA compliant phone message was left for the patient providing contact information and requesting a return call.  The care management team will reach out to the patient again over the next 7 days.  If patient returns call to provider office, please advise to call [facility]  at [place], [place], [place], [place] [address] Direct Dial: [phone] [email or web address]: [email or web address]

## 2021-01-19 DIAGNOSIS — M7072 Other bursitis of hip, left hip: Secondary | ICD-10-CM | POA: Diagnosis not present

## 2021-01-24 NOTE — Chronic Care Management (AMB) (Signed)
  Care Management   Note  01/24/2021 Name: [name] MRN: [id number] DOB: 19-Dec-1940  Samantha Hanna is a 80 y.o. year old female who is a primary care patient of Derrel Nip, Aris Everts, MD and is actively engaged with the care management team. I reached out to Samantha Hanna by phone today to assist with re-scheduling an initial visit with the Licensed Clinical Social Worker  Follow up plan: Telephone appointment with care management team member scheduled for:01/31/2021   Noreene Larsson, [place], [place] [facility]  [place], [place] [address] Direct Dial: [phone] [email or web address] Website: [email or web address]

## 2021-01-30 ENCOUNTER — Other Ambulatory Visit: Payer: Medicare Other

## 2021-01-31 ENCOUNTER — Telehealth: Payer: Medicare Other

## 2021-01-31 ENCOUNTER — Telehealth: Payer: Medicare Other | Admitting: *Deleted

## 2021-01-31 ENCOUNTER — Telehealth: Payer: Self-pay | Admitting: *Deleted

## 2021-01-31 NOTE — Telephone Encounter (Signed)
  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 2nd Unsuccessful Initial Outreach Call Attempt.  The patient was referred to the case management team for assistance with care management and care coordination.  LCSW made several attempts to try and contact patient today to perform the initial phone assessment, but without success.  Patient was unavailable at the time of LCSW's calls.  A HIPAA compliant message was left on voicemail for patient, as LCSW continues to await a return call.  LCSW has requested that the [place] contact patient to try and reschedule the third initial telephone outreach call.  Jabber and Spoofing Codes were utilized when performing outreach calls.    Follow Up Plan: If patient returns call to provider office, please advise to call LCSW at # [phone].  Nat Christen LCSW Licensed Clinical Social Worker [place]  [phone]

## 2021-02-02 DIAGNOSIS — D2221 Melanocytic nevi of right ear and external auricular canal: Secondary | ICD-10-CM | POA: Diagnosis not present

## 2021-02-02 DIAGNOSIS — D2262 Melanocytic nevi of left upper limb, including shoulder: Secondary | ICD-10-CM | POA: Diagnosis not present

## 2021-02-02 DIAGNOSIS — D485 Neoplasm of uncertain behavior of skin: Secondary | ICD-10-CM | POA: Diagnosis not present

## 2021-02-02 DIAGNOSIS — L814 Other melanin hyperpigmentation: Secondary | ICD-10-CM | POA: Diagnosis not present

## 2021-02-02 DIAGNOSIS — L985 Mucinosis of the skin: Secondary | ICD-10-CM | POA: Diagnosis not present

## 2021-02-02 DIAGNOSIS — D225 Melanocytic nevi of trunk: Secondary | ICD-10-CM | POA: Diagnosis not present

## 2021-02-02 DIAGNOSIS — D2261 Melanocytic nevi of right upper limb, including shoulder: Secondary | ICD-10-CM | POA: Diagnosis not present

## 2021-02-02 DIAGNOSIS — Z85828 Personal history of other malignant neoplasm of skin: Secondary | ICD-10-CM | POA: Diagnosis not present

## 2021-02-02 DIAGNOSIS — L570 Actinic keratosis: Secondary | ICD-10-CM | POA: Diagnosis not present

## 2021-02-02 DIAGNOSIS — D2272 Melanocytic nevi of left lower limb, including hip: Secondary | ICD-10-CM | POA: Diagnosis not present

## 2021-02-02 DIAGNOSIS — Z85820 Personal history of malignant melanoma of skin: Secondary | ICD-10-CM | POA: Diagnosis not present

## 2021-02-02 DIAGNOSIS — B081 Molluscum contagiosum: Secondary | ICD-10-CM | POA: Diagnosis not present

## 2021-03-05 ENCOUNTER — Telehealth: Payer: Medicare Other | Admitting: *Deleted

## 2021-03-05 ENCOUNTER — Telehealth: Payer: Self-pay | Admitting: *Deleted

## 2021-03-05 NOTE — Telephone Encounter (Signed)
  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 3rd Unsuccessful Initial Outreach Call Attempt.   Third unsuccessful telephone outreach was attempted today. The patient was referred to the case management team for assistance with care management and care coordination. The patient's primary care provider has been notified of our unsuccessful attempts to make or maintain contact with the patient. The care management team is pleased to engage with this patient at any time in the future should he/she be interested in assistance from the care management team.   Follow Up Plan: If patient returns call to provider office, please advise to contact LCSW directly.  HIPAA compliant message left on voicemail for patient including contact information.    Nat Christen LCSW Licensed Clinical Social Worker [place]  [phone]

## 2021-03-06 NOTE — Chronic Care Management (AMB) (Unsigned)
Erroneous Encounter.  Nat Christen LCSW Licensed Clinical Social Worker [place]  [phone]

## 2021-03-28 ENCOUNTER — Ambulatory Visit: Payer: Medicare Other

## 2021-08-08 DIAGNOSIS — Z23 Encounter for immunization: Secondary | ICD-10-CM | POA: Diagnosis not present

## 2021-08-13 ENCOUNTER — Telehealth: Payer: Self-pay | Admitting: Internal Medicine

## 2021-08-13 ENCOUNTER — Emergency Department: Payer: Medicare Other

## 2021-08-13 ENCOUNTER — Other Ambulatory Visit: Payer: Self-pay

## 2021-08-13 ENCOUNTER — Emergency Department
Admission: EM | Admit: 2021-08-13 | Discharge: 2021-08-13 | Disposition: A | Discharge status: Home / Self Care | Payer: Medicare Other | Attending: Emergency Medicine | Admitting: Emergency Medicine

## 2021-08-13 DIAGNOSIS — M549 Dorsalgia, unspecified: Secondary | ICD-10-CM | POA: Diagnosis not present

## 2021-08-13 DIAGNOSIS — E039 Hypothyroidism, unspecified: Secondary | ICD-10-CM | POA: Insufficient documentation

## 2021-08-13 DIAGNOSIS — S02609A Fracture of mandible, unspecified, initial encounter for closed fracture: Secondary | ICD-10-CM

## 2021-08-13 DIAGNOSIS — G319 Degenerative disease of nervous system, unspecified: Secondary | ICD-10-CM | POA: Diagnosis not present

## 2021-08-13 DIAGNOSIS — S02611A Fracture of condylar process of right mandible, initial encounter for closed fracture: Secondary | ICD-10-CM | POA: Diagnosis not present

## 2021-08-13 DIAGNOSIS — W01198A Fall on same level from slipping, tripping and stumbling with subsequent striking against other object, initial encounter: Secondary | ICD-10-CM | POA: Insufficient documentation

## 2021-08-13 DIAGNOSIS — Z85828 Personal history of other malignant neoplasm of skin: Secondary | ICD-10-CM | POA: Diagnosis not present

## 2021-08-13 DIAGNOSIS — S025XXA Fracture of tooth (traumatic), initial encounter for closed fracture: Secondary | ICD-10-CM | POA: Insufficient documentation

## 2021-08-13 DIAGNOSIS — Z79899 Other long term (current) drug therapy: Secondary | ICD-10-CM | POA: Insufficient documentation

## 2021-08-13 DIAGNOSIS — Z23 Encounter for immunization: Secondary | ICD-10-CM | POA: Insufficient documentation

## 2021-08-13 DIAGNOSIS — R519 Headache, unspecified: Secondary | ICD-10-CM | POA: Insufficient documentation

## 2021-08-13 DIAGNOSIS — M542 Cervicalgia: Secondary | ICD-10-CM | POA: Diagnosis not present

## 2021-08-13 DIAGNOSIS — F0390 Unspecified dementia without behavioral disturbance: Secondary | ICD-10-CM | POA: Insufficient documentation

## 2021-08-13 DIAGNOSIS — S02601A Fracture of unspecified part of body of right mandible, initial encounter for closed fracture: Secondary | ICD-10-CM | POA: Insufficient documentation

## 2021-08-13 DIAGNOSIS — S02612A Fracture of condylar process of left mandible, initial encounter for closed fracture: Secondary | ICD-10-CM | POA: Diagnosis not present

## 2021-08-13 DIAGNOSIS — M4312 Spondylolisthesis, cervical region: Secondary | ICD-10-CM | POA: Diagnosis not present

## 2021-08-13 DIAGNOSIS — M47812 Spondylosis without myelopathy or radiculopathy, cervical region: Secondary | ICD-10-CM | POA: Diagnosis not present

## 2021-08-13 DIAGNOSIS — T148XXA Other injury of unspecified body region, initial encounter: Secondary | ICD-10-CM

## 2021-08-13 DIAGNOSIS — S02602A Fracture of unspecified part of body of left mandible, initial encounter for closed fracture: Secondary | ICD-10-CM | POA: Diagnosis not present

## 2021-08-13 DIAGNOSIS — S0990XA Unspecified injury of head, initial encounter: Secondary | ICD-10-CM | POA: Diagnosis present

## 2021-08-13 DIAGNOSIS — S199XXA Unspecified injury of neck, initial encounter: Secondary | ICD-10-CM | POA: Diagnosis not present

## 2021-08-13 MED ORDER — CLINDAMYCIN HCL 300 MG PO CAPS: 600.0000 mg | ORAL_CAPSULE | Freq: Four times a day (QID) | ORAL | 0 refills | Status: AC

## 2021-08-13 MED ORDER — ACETAMINOPHEN 500 MG PO TABS
1000.0000 mg | ORAL_TABLET | Freq: Once | ORAL | Status: AC
  Administered 2021-08-13: 17:00:00 1000 mg via ORAL
  Filled 2021-08-13: qty 2

## 2021-08-13 MED ORDER — TETANUS-DIPHTH-ACELL PERTUSSIS 5-2.5-18.5 LF-MCG/0.5 IM SUSY
0.5000 mL | PREFILLED_SYRINGE | Freq: Once | INTRAMUSCULAR | Status: AC
  Administered 2021-08-13: 17:00:00 0.5 mL via INTRAMUSCULAR
  Filled 2021-08-13: qty 0.5

## 2021-08-13 MED ORDER — CLINDAMYCIN HCL 150 MG PO CAPS
300.0000 mg | ORAL_CAPSULE | Freq: Once | ORAL | Status: AC
  Administered 2021-08-13: 17:00:00 300 mg via ORAL
  Filled 2021-08-13: qty 2

## 2021-08-13 MED ORDER — NAPROXEN 500 MG PO TABS
500.0000 mg | ORAL_TABLET | Freq: Once | ORAL | Status: AC
  Administered 2021-08-13: 17:00:00 500 mg via ORAL
  Filled 2021-08-13: qty 1

## 2021-08-13 NOTE — Telephone Encounter (Signed)
FYI

## 2021-08-13 NOTE — Telephone Encounter (Signed)
Daughter called in to advise Dr Derrel Nip that her  mother had a fall, she is at emergency room [facility] , Can called daughter back at [phone]

## 2021-08-13 NOTE — ED Provider Notes (Signed)
[facility] Emergency Department Provider Note  ____________________________________________   Event Date/Time   First MD Initiated Contact with Patient 08/13/21 1622     (approximate)  I have reviewed the triage vital signs and the nursing notes.   HISTORY  Chief Complaint Fall   HPI Samantha Hanna is a 80 y.o. female with the below noted past medical history who presents for assessment of face and jaw pain after tripping on a curb and hitting her face against the pavement.  Patient states she did not injure anywhere else including her bilateral upper extremities or lower extremities and does not currently have any neck or back pain.  She is not on any blood thinners.  She denies any chest or abdominal pain.  She is adamant she was not feeling lightheaded dizzy short of breath or had any other preceding symptoms before tripping on the curb.  She otherwise has been in her usual state of health without any recent fevers, chills, cough, nausea, vomiting, diarrhea, rash, burning with urination or any other acute recent sick symptoms or falls or injuries.  She is not sure when her last tetanus shot was.  No other acute concerns at this time.         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 04/07/2017   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   Impingement syndrome of left shoulder region 02/28/2017   Cervical neck pain with evidence of disc disease 03/24/2016   Benign paroxysmal positional  vertigo 01/08/2016   Carpal tunnel syndrome 01/08/2016   Symptoms, such as flushing, sleeplessness, headache, lack of concentration, associated with the menopause 05/14/2015   Spinal stenosis in cervical region 05/14/2015   Anxiety state 04/18/2015   Vascular dementia ([place]) 04/18/2015   Shoulder pain, left 04/07/2015   B12 deficiency 02/15/2014   Irritable bowel syndrome 09/08/2013   Preoperative evaluation to rule out surgical contraindication 07/15/2012   Hypothyroidism 07/15/2012   Statin intolerance 07/14/2012   Screening for breast cancer 07/11/2011   Screening for colon cancer 07/11/2011    Past Surgical History:  Procedure Laterality Date   APPENDECTOMY     CARPOMETACARPAL ([place]) FUSION OF THUMB Left 09/10/2017   Procedure: CARPOMETACARPAL ([place]) FUSION OF THUMB;  Surgeon: Earnestine Leys, MD;  Location: [facility] ORS;  Service: Orthopedics;  Laterality: Left;   MELANOMA EXCISION     x 2, Dr. Sharlett Iles    Prior to Admission medications   Medication Sig Start Date End Date Taking? Authorizing Provider  clindamycin (CLEOCIN) 300 MG capsule Take 2 capsules (600 mg total) by mouth 4 (four) times daily for 7 days. 08/13/21 08/20/21 Yes Lucrezia Starch, MD  acetaminophen (TYLENOL) 325 MG tablet Take 325 mg by mouth every 6 (six) hours as needed for mild pain or headache.    [provider]  ezetimibe (ZETIA) 10 MG tablet Take 1 tablet (10 mg total) by mouth daily. 01/05/21   Crecencio Mc, MD  fluticasone (FLONASE) 50 MCG/ACT nasal spray Place  2 sprays into both nostrils daily. 01/01/21   Crecencio Mc, MD  folic acid (FOLVITE) 619 MCG tablet Take 400 mcg by mouth daily.    [provider]  Multiple Vitamin (MULTIVITAMIN WITH MINERALS) TABS tablet Take 1 tablet by mouth daily.    [provider]  Omega-3 Fatty Acids (OMEGA 3 PO) Take 30 mLs by mouth daily. 800 mg of Fish Oil  per serving    [provider]  SYNTHROID 75 MCG tablet Take 1 tablet (75 mcg  total) by mouth daily before breakfast. 07/24/20   Crecencio Mc, MD  vitamin B-12 (CYANOCOBALAMIN) 1000 MCG tablet Take 1,000 mcg by mouth daily.    [provider]    Allergies Hydrocodone, Shellfish allergy, Statins, and Penicillins  Family History  Problem Relation Age of Onset   Osteoporosis Mother        Deceased, 19   Coronary artery disease Father 44       Deceased, 67 in MVA   Heart Problems Father    Arthritis Daughter 35       Rheumatiod   Healthy Daughter     Social History Social History   Tobacco Use   Smoking status: Never   Smokeless tobacco: Never  Vaping Use   Vaping Use: Never used  Substance Use Topics   Alcohol use: Yes    Alcohol/week: 3.0 standard drinks    Types: 3 Standard drinks or equivalent per week    Comment: social   Drug use: No    Review of Systems  Review of Systems  Constitutional:  Negative for chills and fever.  HENT:  Negative for sore throat.   Eyes:  Negative for pain.  Respiratory:  Negative for cough and stridor.   Cardiovascular:  Negative for chest pain.  Gastrointestinal:  Negative for vomiting.  Genitourinary:  Negative for dysuria.  Musculoskeletal:  Negative for myalgias.  Skin:  Negative for rash.  Neurological:  Positive for headaches. Negative for seizures and loss of consciousness.  Psychiatric/Behavioral:  Negative for suicidal ideas.   All other systems reviewed and are negative.    ____________________________________________   PHYSICAL EXAM:  VITAL SIGNS: ED Triage Vitals  Enc Vitals Group     BP 08/13/21 1528 (!) 152/75     Pulse Rate 08/13/21 1528 80     Resp 08/13/21 1528 16     Temp 08/13/21 1528 97.7 F (36.5 C)     Temp src --      SpO2 08/13/21 1528 93 %     Weight 08/13/21 1527 145 lb (65.8 kg)     Height 08/13/21 1527 5\' 6"  (1.676 m)     Head Circumference --      Peak Flow --      Pain Score 08/13/21 1527 8     Pain Loc --      Pain Edu? --      Excl. in [place]? --     Vitals:   08/13/21 1528  BP: (!) 152/75  Pulse: 80  Resp: 16  Temp: 97.7 F (36.5 C)  SpO2: 93%   Physical Exam Vitals and nursing note reviewed.  Constitutional:      General: She is not in acute distress.    Appearance: She is well-developed.  HENT:     Head: Normocephalic.     Right Ear: External ear normal.     Left Ear: External ear normal.     Nose: Nose normal.  Eyes:  Conjunctiva/sclera: Conjunctivae normal.  Cardiovascular:     Rate and Rhythm: Normal rate and regular rhythm.     Heart sounds: No murmur heard. Pulmonary:     Effort: Pulmonary effort is normal. No respiratory distress.     Breath sounds: Normal breath sounds.  Abdominal:     Palpations: Abdomen is soft.     Tenderness: There is no abdominal tenderness.  Musculoskeletal:        General: No swelling.     Cervical back: Neck supple.  Skin:    General: Skin is warm and dry.     Capillary Refill: Capillary refill takes less than 2 seconds.  Neurological:     Mental Status: She is alert and oriented to person, place, and time.  Psychiatric:        Mood and Affect: Mood normal.    Cranial nerves II through XII are grossly intact.  Patient has no difficulty managing her secretions and has no respiratory distress or stridor over the neck.  There is an abrasion immediately under the right nostril as well as under the chin and a small laceration on the inside of the upper lip.  Patient also has chipped her 2 front teeth.  They are otherwise not loose and do not see any other obvious injury teeth.  Oropharynx is otherwise unremarkable.   TMs are unremarkable bilaterally and there is no trauma to the posterior frontal or superior scalp but there is some tenderness of the bilateral TMJ regions.  Patient is able to speak without difficulty and swallow without any difficulty.  There is edema over the bilateral TMJ joints is no obvious deformity. ____________________________________________   LABS (all  labs ordered are listed, but only abnormal results are displayed)  Labs Reviewed - No data to display ____________________________________________  EKG  ____________________________________________  RADIOLOGY  ED MD interpretation:    CT face shows acute fractures at the base of the bilateral mandible condyle with anterior displacement and dislocation of the condyles.  CT head shows no skull fracture or intracranial hemorrhage or other clear acute process aside from chronic microvascular ischemic changes and atrophy.  CT C-spine shows no acute fracture or traumatic alignment.  There is multilevel degenerative changes and foraminal stenosis.  Official radiology report(s): CT HEAD WO CONTRAST (5MM)  Result Date: 08/13/2021 CLINICAL DATA:  Head trauma, moderate-severe; Neck trauma (Age >= 65y); Facial trauma, blunt EXAM: CT HEAD WITHOUT CONTRAST CT MAXILLOFACIAL WITHOUT CONTRAST CT CERVICAL SPINE WITHOUT CONTRAST TECHNIQUE: Multidetector CT imaging of the head, cervical spine, and maxillofacial structures were performed using the standard protocol without intravenous contrast. Multiplanar CT image reconstructions of the cervical spine and maxillofacial structures were also generated. COMPARISON:  MRI of the cervical spine 05/04/2015. MRI head 05/04/2015. FINDINGS: CT HEAD FINDINGS Brain: No evidence of acute large vascular territory infarction, hemorrhage, hydrocephalus, extra-axial collection or mass lesion/mass effect. Patchy white matter hypoattenuation, nonspecific but compatible with chronic microvascular ischemic disease. Atrophy with ex vacuo ventricular dilation. Vascular: No hyperdense vessel identified. Calcific intracranial atherosclerosis. Skull: No acute fracture. Other: No mastoid effusions. CT MAXILLOFACIAL FINDINGS Osseous: Acute fractures at the base of bilateral mandibular condyles with anterior displacement and dislocation of the condyles. Orbits: Negative. No traumatic or  inflammatory finding. Sinuses: Clear. Soft tissues: Negative. CT CERVICAL SPINE FINDINGS Alignment: Mild retrolisthesis C4 on C5, favor degenerative given degenerative changes at this level. Otherwise, no substantial sagittal subluxation. Skull base and vertebrae: No evidence of acute fracture. Corticated fragment along the left occipital condyle is  favored degenerative or secondary to remote trauma. Vertebral body heights are maintained. Soft tissues and spinal canal: No prevertebral fluid or swelling. No visible canal hematoma. Disc levels: Multilevel degenerative disease, greatest at C4-C5. Multilevel facet/uncovertebral hypertrophy with potentially moderate foraminal stenosis bilaterally at C4-C5 and on the left at C5-C6. Possibly moderate canal stenosis at C3-C4 and C4-C5. Upper chest: Visualized lung apices are clear Other: Small thyroid. IMPRESSION: CT maxillofacial: Acute fractures at the base of bilateral mandibular condyles with anterior displacement and dislocation of the condyles. CT head: 1. No evidence of acute intracranial abnormality 2. Chronic microvascular ischemic disease and atrophy. CT cervical spine: 1. No evidence of acute fracture or traumatic malalignment. 2. Multilevel degenerative change, including potentially moderate foraminal stenosis bilaterally at C4-C5 and on the left at C5-C6 and potentially moderate canal stenosis C3-C4 and C4-C5. MRI of the cervical spine could better evaluate the canal and foramina clinically indicated. Electronically Signed   By: Margaretha Sheffield M.D.   On: 08/13/2021 16:15   CT Cervical Spine Wo Contrast  Result Date: 08/13/2021 CLINICAL DATA:  Head trauma, moderate-severe; Neck trauma (Age >= 65y); Facial trauma, blunt EXAM: CT HEAD WITHOUT CONTRAST CT MAXILLOFACIAL WITHOUT CONTRAST CT CERVICAL SPINE WITHOUT CONTRAST TECHNIQUE: Multidetector CT imaging of the head, cervical spine, and maxillofacial structures were performed using the standard protocol  without intravenous contrast. Multiplanar CT image reconstructions of the cervical spine and maxillofacial structures were also generated. COMPARISON:  MRI of the cervical spine 05/04/2015. MRI head 05/04/2015. FINDINGS: CT HEAD FINDINGS Brain: No evidence of acute large vascular territory infarction, hemorrhage, hydrocephalus, extra-axial collection or mass lesion/mass effect. Patchy white matter hypoattenuation, nonspecific but compatible with chronic microvascular ischemic disease. Atrophy with ex vacuo ventricular dilation. Vascular: No hyperdense vessel identified. Calcific intracranial atherosclerosis. Skull: No acute fracture. Other: No mastoid effusions. CT MAXILLOFACIAL FINDINGS Osseous: Acute fractures at the base of bilateral mandibular condyles with anterior displacement and dislocation of the condyles. Orbits: Negative. No traumatic or inflammatory finding. Sinuses: Clear. Soft tissues: Negative. CT CERVICAL SPINE FINDINGS Alignment: Mild retrolisthesis C4 on C5, favor degenerative given degenerative changes at this level. Otherwise, no substantial sagittal subluxation. Skull base and vertebrae: No evidence of acute fracture. Corticated fragment along the left occipital condyle is favored degenerative or secondary to remote trauma. Vertebral body heights are maintained. Soft tissues and spinal canal: No prevertebral fluid or swelling. No visible canal hematoma. Disc levels: Multilevel degenerative disease, greatest at C4-C5. Multilevel facet/uncovertebral hypertrophy with potentially moderate foraminal stenosis bilaterally at C4-C5 and on the left at C5-C6. Possibly moderate canal stenosis at C3-C4 and C4-C5. Upper chest: Visualized lung apices are clear Other: Small thyroid. IMPRESSION: CT maxillofacial: Acute fractures at the base of bilateral mandibular condyles with anterior displacement and dislocation of the condyles. CT head: 1. No evidence of acute intracranial abnormality 2. Chronic  microvascular ischemic disease and atrophy. CT cervical spine: 1. No evidence of acute fracture or traumatic malalignment. 2. Multilevel degenerative change, including potentially moderate foraminal stenosis bilaterally at C4-C5 and on the left at C5-C6 and potentially moderate canal stenosis C3-C4 and C4-C5. MRI of the cervical spine could better evaluate the canal and foramina clinically indicated. Electronically Signed   By: Margaretha Sheffield M.D.   On: 08/13/2021 16:15   CT Maxillofacial Wo Contrast  Result Date: 08/13/2021 CLINICAL DATA:  Head trauma, moderate-severe; Neck trauma (Age >= 65y); Facial trauma, blunt EXAM: CT HEAD WITHOUT CONTRAST CT MAXILLOFACIAL WITHOUT CONTRAST CT CERVICAL SPINE WITHOUT CONTRAST TECHNIQUE: Multidetector CT imaging of  the head, cervical spine, and maxillofacial structures were performed using the standard protocol without intravenous contrast. Multiplanar CT image reconstructions of the cervical spine and maxillofacial structures were also generated. COMPARISON:  MRI of the cervical spine 05/04/2015. MRI head 05/04/2015. FINDINGS: CT HEAD FINDINGS Brain: No evidence of acute large vascular territory infarction, hemorrhage, hydrocephalus, extra-axial collection or mass lesion/mass effect. Patchy white matter hypoattenuation, nonspecific but compatible with chronic microvascular ischemic disease. Atrophy with ex vacuo ventricular dilation. Vascular: No hyperdense vessel identified. Calcific intracranial atherosclerosis. Skull: No acute fracture. Other: No mastoid effusions. CT MAXILLOFACIAL FINDINGS Osseous: Acute fractures at the base of bilateral mandibular condyles with anterior displacement and dislocation of the condyles. Orbits: Negative. No traumatic or inflammatory finding. Sinuses: Clear. Soft tissues: Negative. CT CERVICAL SPINE FINDINGS Alignment: Mild retrolisthesis C4 on C5, favor degenerative given degenerative changes at this level. Otherwise, no substantial  sagittal subluxation. Skull base and vertebrae: No evidence of acute fracture. Corticated fragment along the left occipital condyle is favored degenerative or secondary to remote trauma. Vertebral body heights are maintained. Soft tissues and spinal canal: No prevertebral fluid or swelling. No visible canal hematoma. Disc levels: Multilevel degenerative disease, greatest at C4-C5. Multilevel facet/uncovertebral hypertrophy with potentially moderate foraminal stenosis bilaterally at C4-C5 and on the left at C5-C6. Possibly moderate canal stenosis at C3-C4 and C4-C5. Upper chest: Visualized lung apices are clear Other: Small thyroid. IMPRESSION: CT maxillofacial: Acute fractures at the base of bilateral mandibular condyles with anterior displacement and dislocation of the condyles. CT head: 1. No evidence of acute intracranial abnormality 2. Chronic microvascular ischemic disease and atrophy. CT cervical spine: 1. No evidence of acute fracture or traumatic malalignment. 2. Multilevel degenerative change, including potentially moderate foraminal stenosis bilaterally at C4-C5 and on the left at C5-C6 and potentially moderate canal stenosis C3-C4 and C4-C5. MRI of the cervical spine could better evaluate the canal and foramina clinically indicated. Electronically Signed   By: Margaretha Sheffield M.D.   On: 08/13/2021 16:15    ____________________________________________   PROCEDURES  Procedure(s) performed (including Critical Care):  Procedures   ____________________________________________   INITIAL IMPRESSION / ASSESSMENT AND PLAN / ED COURSE      Patient presents with above-stated history and exam for assessment of facial industries described above after ground-level fall with clear mechanical trip mechanism described.  On arrival she is slight hypertensive with otherwise stable vital signs on room air.  She denies any other acute concerns and states she is primarily only sore at the bilateral TMJ  regions.  CT face shows acute fractures at the base of the bilateral mandible condyle with anterior displacement and dislocation of the condyles.  CT head shows no skull fracture or intracranial hemorrhage or other clear acute process aside from chronic microvascular ischemic changes and atrophy.  CT C-spine shows no acute fracture or traumatic alignment.  There is multilevel degenerative changes and foraminal stenosis.  At this time L[place]o suspicion for other significant occult injury not described on exam or CT.  I do not believe the inner lip laceration requires closure at this time and is not through and through.  Abrasions were cleaned.  Tetanus updated.  Discussed patient's injuries with on-call ENT physician Dr.   Kathyrn Sheriff  Who recommended no emergent surgical intervention or attempt at reduction at this time.  Recommended close outpatient OMFS follow-up in 1 week and liquid diet.  On reassessment patient states she prefers to use Tylenol and NSAIDs for pain control and does not want any opioids.  She is able to tolerate p.o. at this time without difficulty.  Given pain seems to be under control with patient having no difficulty swallowing or tolerating secretions think she is stable for discharge with outpatient follow-up.  Will prescribe clindamycin for possible open fracture.  Also updated patient's daughter.  She will follow-up with her dentist and OMFS and call her PCP tomorrow to inform them of recent events.  She has no other concerns at this time.  Discharged in stable condition.  Strict and precautions advised and discussed.      ____________________________________________   FINAL CLINICAL IMPRESSION(S) / ED DIAGNOSES  Final diagnoses:  Closed fracture of jaw, initial encounter ([place])  Abrasion  Closed fracture of tooth, initial encounter    Medications  acetaminophen (TYLENOL) tablet 1,000 mg (1,000 mg Oral Given 08/13/21 1648)  naproxen (NAPROSYN) tablet 500 mg (500 mg  Oral Given 08/13/21 1648)  clindamycin (CLEOCIN) capsule 300 mg (300 mg Oral Given 08/13/21 1648)  Tdap (BOOSTRIX) injection 0.5 mL (0.5 mLs Intramuscular Given 08/13/21 1728)     ED Discharge Orders          Ordered    clindamycin (CLEOCIN) 300 MG capsule  4 times daily        08/13/21 1734             Note:  This document was prepared using Dragon voice recognition software and may include unintentional dictation errors.    Lucrezia Starch, MD 08/13/21 [phone]

## 2021-08-13 NOTE — ED Triage Notes (Signed)
Pt to ED for fall, states tripped over sandal. Denies LOC, dizziness. No blood thinner use.  Front teeth knocked out during fall. Minimal swelling noted to upper lip. Pt c/o pain to both sides of jaw.

## 2021-08-13 NOTE — ED Notes (Signed)
Pt to ED for mechanical fall. Tripped on shoes. Pt in NAD, accompanied by family member.

## 2021-08-15 ENCOUNTER — Other Ambulatory Visit: Payer: Self-pay | Admitting: Internal Medicine

## 2021-08-25 DIAGNOSIS — Z23 Encounter for immunization: Secondary | ICD-10-CM | POA: Diagnosis not present

## 2021-12-03 ENCOUNTER — Telehealth: Payer: Self-pay | Admitting: Internal Medicine

## 2021-12-03 NOTE — Telephone Encounter (Signed)
Copied from [facility] [id number]. Topic: Medicare AWV ?>> Dec 03, 2021 10:04 AM Harris-Coley, Hannah Beat wrote: ?Reason for CRM: Attempted to schedule AWV. Unable to LVM.  Will try at later time. ?

## 2022-02-05 ENCOUNTER — Ambulatory Visit (INDEPENDENT_AMBULATORY_CARE_PROVIDER_SITE_OTHER): Payer: Medicare Other

## 2022-02-05 VITALS — Ht 66.0 in | Wt 145.0 lb

## 2022-02-05 DIAGNOSIS — Z0000 Encounter for general adult medical examination without abnormal findings: Secondary | ICD-10-CM

## 2022-02-05 DIAGNOSIS — Z1231 Encounter for screening mammogram for malignant neoplasm of breast: Secondary | ICD-10-CM | POA: Diagnosis not present

## 2022-02-05 NOTE — Patient Instructions (Addendum)
  Samantha Hanna , Thank you for taking time to come for your Medicare Wellness Visit. I appreciate your ongoing commitment to your health goals. Please review the following plan we discussed and let me know if I can assist you in the future.   These are the goals we discussed:  Goals       Patient Stated     Increase physical activity (pt-stated)      Walk more for exercise        This is a list of the screening recommended for you and due dates:  Health Maintenance  Topic Date Due   Mammogram  05/03/2021   COVID-19 Vaccine (4 - Pfizer series) 02/21/2022*   Hepatitis C Screening: USPSTF Recommendation to screen - Ages 18-79 yo.  04/26/2022*   Flu Shot  03/26/2022   Tetanus Vaccine  08/14/2031   Pneumonia Vaccine  Completed   DEXA scan (bone density measurement)  Completed   Zoster (Shingles) Vaccine  Completed   HPV Vaccine  Aged Out   Cologuard (Stool DNA test)  Discontinued  *Topic was postponed. The date shown is not the original due date.

## 2022-02-05 NOTE — Progress Notes (Signed)
Subjective:   Samantha Hanna is a 81 y.o. female who presents for Medicare Annual (Subsequent) preventive examination.  Review of Systems    No ROS.  Medicare Wellness Virtual Visit.  Visual/audio telehealth visit, UTA vital signs.   See social history for additional risk factors.         Objective:    Today's Vitals   02/05/22 0921  Weight: 145 lb (65.8 kg)  Height: '5\' 6"'$  (1.676 m)   Body mass index is 23.4 kg/m.     02/05/2022    9:27 AM 08/13/2021    3:28 PM 03/27/2020    9:48 AM 03/25/2019    9:45 AM 03/23/2018   11:10 AM 09/08/2017    5:06 PM 03/21/2017   11:16 AM  Advanced Directives  Does Patient Have a Medical Advance Directive? Yes No Yes Yes Yes Yes Yes  Type of Paramedic of [place];Living will  [employer];Living will [place];Living will [place];Living will  [place];Living will  Does patient want to make changes to medical advance directive? No - Patient declined  No - Patient declined No - Patient declined No - Patient declined  No - Patient declined  Copy of [place] in Chart? No - copy requested  No - copy requested No - copy requested   No - copy requested    Current Medications (verified) Outpatient Encounter Medications as of 02/05/2022  Medication Sig   acetaminophen (TYLENOL) 325 MG tablet Take 325 mg by mouth every 6 (six) hours as needed for mild pain or headache.   ezetimibe (ZETIA) 10 MG tablet Take 1 tablet (10 mg total) by mouth daily.   fluticasone (FLONASE) 50 MCG/ACT nasal spray Place 2 sprays into both nostrils daily.   folic acid (FOLVITE) 400 MCG tablet Take 400 mcg by mouth daily.   Multiple Vitamin (MULTIVITAMIN WITH MINERALS) TABS tablet Take 1 tablet by mouth daily.   Omega-3 Fatty Acids (OMEGA 3 PO) Take 30 mLs by mouth daily. 800 mg of Fish Oil  per serving   SYNTHROID 75 MCG tablet TAKE 1 TABLET BY MOUTH DAILY  BEFORE BREAKFAST.   vitamin B-12 (CYANOCOBALAMIN) 1000 MCG tablet Take 1,000 mcg by mouth daily.   No facility-administered encounter medications on file as of 02/05/2022.    Allergies (verified) Hydrocodone, Shellfish allergy, Statins, and Penicillins   History: Past Medical History:  Diagnosis Date   Arthritis    Cancer ([place])    SKIN CANCER-BASAL CELL   GERD (gastroesophageal reflux disease)    Hyperlipidemia    Hypothyroidism    Thyroid disease    Past Surgical History:  Procedure Laterality Date   APPENDECTOMY     CARPOMETACARPAL ([place]) FUSION OF THUMB Left 09/10/2017   Procedure: CARPOMETACARPAL ([place]) FUSION OF THUMB;  Surgeon: Earnestine Leys, MD;  Location: [facility] ORS;  Service: Orthopedics;  Laterality: Left;   MELANOMA EXCISION     x 2, Dr. Sharlett Iles   Family History  Problem Relation Age of Onset   Osteoporosis Mother        Deceased, 69   Coronary artery disease Father 15       Deceased, 21 in MVA   Heart Problems Father    Arthritis Daughter 18       Rheumatiod   Healthy Daughter    Social History   Socioeconomic History   Marital status: Married    Spouse name: Not on file  Number of children: Not on file   Years of education: Not on file   Highest education level: Not on file  Occupational History   Not on file  Tobacco Use   Smoking status: Never   Smokeless tobacco: Never  Vaping Use   Vaping Use: Never used  Substance and Sexual Activity   Alcohol use: Yes    Alcohol/week: 3.0 standard drinks of alcohol    Types: 3 Standard drinks or equivalent per week    Comment: social   Drug use: No   Sexual activity: Yes  Other Topics Concern   Not on file  Social History Narrative   She lives with husband (optometrist) who has Parkinson's disease.  They have two grown children.   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    Highest level of education:  Water quality scientist   Social Determinants of Health   Financial  Resource Strain: Low Risk  (02/05/2022)   Overall Financial Resource Strain (CARDIA)    Difficulty of Paying Living Expenses: Not hard at all  Food Insecurity: No Food Insecurity (02/05/2022)   Hunger Vital Sign    Worried About Running Out of Food in the Last Year: Never true    Ran Out of Food in the Last Year: Never true  Transportation Needs: No Transportation Needs (02/05/2022)   PRAPARE - Hydrologist (Medical): No    Lack of Transportation (Non-Medical): No  Physical Activity: Sufficiently Active (02/05/2022)   Exercise Vital Sign    Days of Exercise per Week: 5 days    Minutes of Exercise per Session: 30 min  Stress: No Stress Concern Present (02/05/2022)   [place]    Feeling of Stress : Not at all  Social Connections: Unknown (02/05/2022)   Social Connection and Isolation Panel [NHANES]    Frequency of Communication with Friends and Family: More than three times a week    Frequency of Social Gatherings with Friends and Family: More than three times a week    Attends Religious Services: Not on Advertising copywriter or Organizations: Yes    Attends Archivist Meetings: Not on file    Marital Status: Married   Tobacco Counseling Counseling given: Not Answered  Clinical Intake: Pre-visit preparation completed: Yes        Diabetes: No  How often do you need to have someone help you when you read instructions, pamphlets, or other written materials from your doctor or pharmacy?: 1 - Never  Interpreter Needed?: No    Activities of Daily Living    02/05/2022    9:29 AM  In your present state of health, do you have any difficulty performing the following activities:  Hearing? 0  Vision? 0  Difficulty concentrating or making decisions? 0  Walking or climbing stairs? 0  Dressing or bathing? 0  Doing errands, shopping? 0  Preparing Food and eating ? N  Using  the Toilet? N  In the past six months, have you accidently leaked urine? N  Do you have problems with loss of bowel control? N  Managing your Medications? N  Managing your Finances? N  Housekeeping or managing your Housekeeping? N   Patient Care Team: Crecencio Mc, MD as PCP - General (Internal Medicine)  Indicate any recent Medical Services you may have received from other than [facility] providers in the past year (date  may be approximate).     Assessment:   This is a routine wellness examination for [facility].  Virtual Visit via Telephone Note  I connected with  Rolland Bimler on 02/05/22 at  9:15 AM EDT by telephone and verified that I am speaking with the correct person using two identifiers.  Persons participating in the virtual visit: patient/Nurse Health Advisor   I discussed the limitations of performing an evaluation and management service by telehealth. We continued and completed visit with audio only. Some vital signs may be absent or patient reported.   Hearing/Vision screen Hearing Screening - Comments:: Patient is able to hear conversational tones without difficulty. No issues reported.  Vision Screening - Comments:: Followed by Dr. Ellin Mayhew  Wears corrective lenses  They have seen their ophthalmologist in the last 12 months.   Dietary issues and exercise activities discussed: Current Exercise Habits: Home exercise routine, Type of exercise: walking, Time (Minutes): 30, Frequency (Times/Week): 5, Weekly Exercise (Minutes/Week): 150, Intensity: Mild Healthy diet Good water intake   Goals Addressed               This Visit's Progress     Patient Stated     Increase physical activity (pt-stated)   On track     Walk more for exercise       Depression Screen    02/05/2022    9:26 AM 01/01/2021   11:27 AM 03/27/2020    9:45 AM 03/27/2019   10:51 PM 03/25/2019    9:41 AM 03/23/2018   11:15 AM 03/21/2017   10:57 AM  PHQ 2/9 Scores  PHQ - 2 Score 0 1 0 0 0 0 0  PHQ- 9  Score  1  0       Fall Risk    02/05/2022    9:28 AM 01/01/2021   11:23 AM 07/24/2020    9:14 AM 04/19/2020    9:06 AM 03/27/2020    9:51 AM  Fall Risk   Falls in the past year? 0 0 0 0 0  Number falls in past yr:  0   0  Injury with Fall?  0   0  Follow up Falls evaluation completed Falls evaluation completed Falls evaluation completed Falls evaluation completed Falls evaluation completed    [place]: Home free of loose throw rugs in walkways, pet beds, electrical cords, etc? Yes  Adequate lighting in your home to reduce risk of falls? Yes   ASSISTIVE DEVICES UTILIZED TO PREVENT FALLS: Life alert? No  Use of a cane, walker or w/c? No   TIMED UP AND GO: Was the test performed? No .   Cognitive Function: Patient is alert and oriented x3.     03/23/2018   11:19 AM 03/21/2017   11:40 AM 03/21/2016    3:36 PM  MMSE - Mini Mental State Exam  Orientation to time '5 5 5  '$ Orientation to Place '5 5 5  '$ Registration '3 3 3  '$ Attention/ Calculation '5 5 5  '$ Recall '3 3 3  '$ Language- name 2 objects '2 2 2  '$ Language- repeat '1 1 1  '$ Language- follow 3 step command '3 3 3  '$ Language- read & follow direction '1 1 1  '$ Write a sentence '1 1 1  '$ Copy design '1 1 1  '$ Total score '30 30 30        '$ 03/27/2020    9:53 AM 03/25/2019    9:45 AM  6CIT Screen  What Year?  0 points 0 points  What month? 0 points 0 points  What time?  0 points  Count back from 20  0 points  Months in reverse 4 points 0 points  Repeat phrase 4 points 0 points  Total Score  0 points    Immunizations Immunization History  Administered Date(s) Administered   Influenza Split 06/05/2011, 05/26/2012, 06/24/2013, 06/09/2014   Influenza, High Dose Seasonal PF 06/05/2019, 05/19/2020   Influenza-Unspecified 05/17/2015, 05/16/2016, 05/13/2018, 06/08/2018   PFIZER(Purple Top)SARS-COV-2 Vaccination 09/16/2019, 10/07/2019, 06/23/2020   PPD Test 04/17/2015   Pneumococcal Conjugate-13 06/16/2014    Pneumococcal Polysaccharide-23 07/11/2011, 04/14/2017   Tdap 07/14/2012, 08/13/2021   Zoster Recombinat (Shingrix) 11/19/2017, 02/23/2018   Zoster, Live 07/16/2010   Screening Tests Health Maintenance  Topic Date Due   MAMMOGRAM  05/03/2021   COVID-19 Vaccine (4 - Pfizer series) 02/21/2022 (Originally 08/18/2020)   Hepatitis C Screening  04/26/2022 (Originally 07/02/1959)   INFLUENZA VACCINE  03/26/2022   TETANUS/TDAP  08/14/2031   Pneumonia Vaccine 21+ Years old  Completed   DEXA SCAN  Completed   Zoster Vaccines- Shingrix  Completed   HPV VACCINES  Aged Out   Fecal DNA (Cologuard)  Discontinued   Health Maintenance Health Maintenance Due  Topic Date Due   MAMMOGRAM  05/03/2021   Mammogram- ordered today per consent. Phone number [phone] provided for self scheduling.  Lung Cancer Screening: (Low Dose CT Chest recommended if Age 63-80 years, 30 pack-year currently smoking OR have quit w/in 15years.) does not qualify.   Hepatitis C Screening: does not qualify.  Vision Screening: Recommended annual ophthalmology exams for early detection of glaucoma and other disorders of the eye.  Dental Screening: Recommended annual dental exams for proper oral hygiene.  Community Resource Referral / Chronic Care Management: CRR required this visit?  No   CCM required this visit?  No      Plan:   Keep all routine maintenance appointments.   I have personally reviewed and noted the following in the patient's chart:   Medical and social history Use of alcohol, tobacco or illicit drugs  Current medications and supplements including opioid prescriptions.  Functional ability and status Nutritional status Physical activity Advanced directives List of other physicians Hospitalizations, surgeries, and ER visits in previous 12 months Vitals Screenings to include cognitive, depression, and falls Referrals and appointments  In addition, I have reviewed and discussed with patient  certain preventive protocols, quality metrics, and best practice recommendations. A written personalized care plan for preventive services as well as general preventive health recommendations were provided to patient.     Varney Biles, LPN   6/38/9373

## 2022-03-13 IMAGING — CT CT HEAD W/O CM
4 series · 15 of 47 positions shown, 17 images · non-contrast
Comparison: MRI of the cervical spine 05/04/2015. MRI head
05/04/2015.

CLINICAL DATA: Head trauma, moderate-severe; Neck trauma (Age >=
65y); Facial trauma, blunt

EXAM:
CT HEAD WITHOUT CONTRAST
CT MAXILLOFACIAL WITHOUT CONTRAST
CT CERVICAL SPINE WITHOUT CONTRAST
TECHNIQUE: Multidetector CT imaging of the head, cervical spine, and
maxillofacial structures were performed using the standard protocol
without intravenous contrast. Multiplanar CT image reconstructions
of the cervical spine and maxillofacial structures were also
generated.

[Series 2: head wo · axial · 0.39mm/px · z∈[+383,+498]mm · 7 of 31 slices shown, 9 images]
[im 4/31  brain]
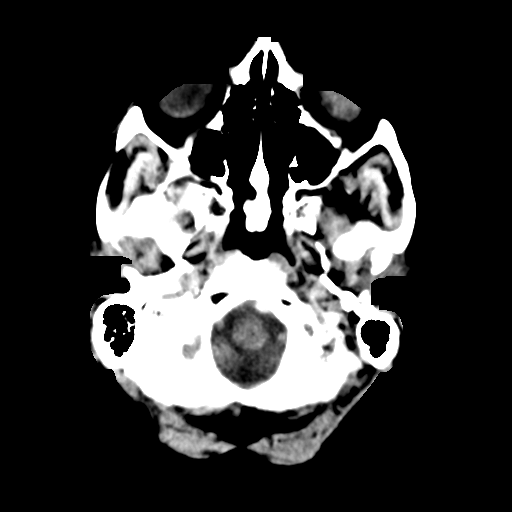
[im 4/31  bone]
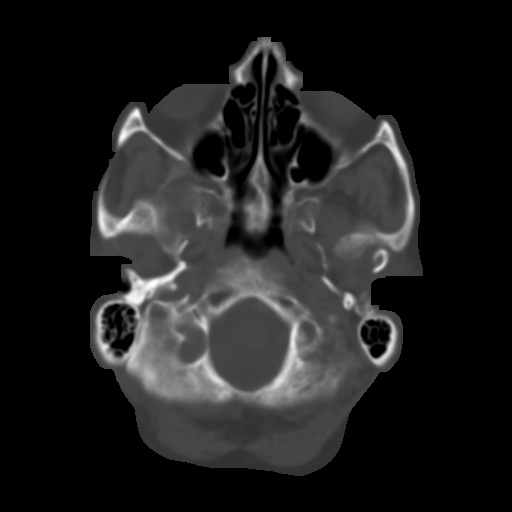
[im 8/31  brain]
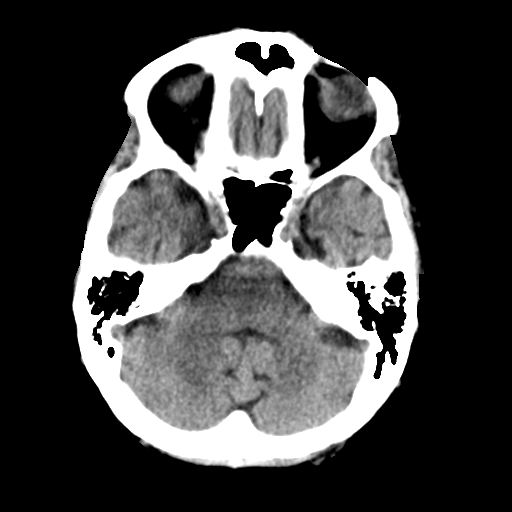
[im 12/31  brain]
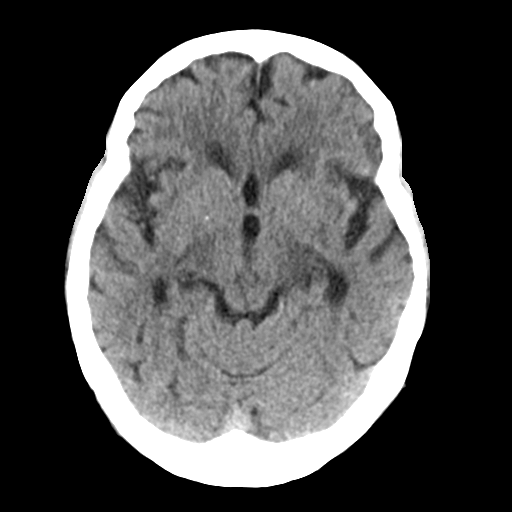
[im 16/31  brain]
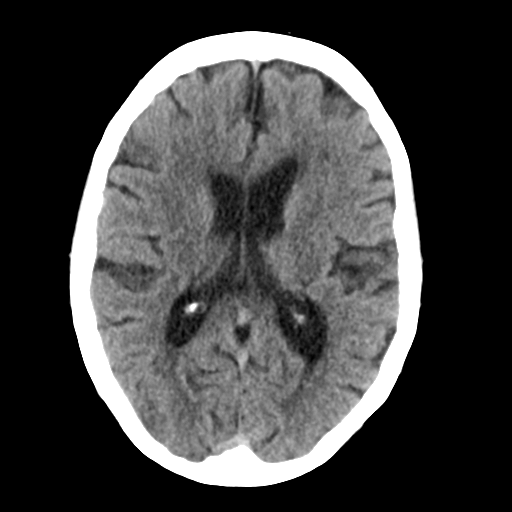
[im 19/31  brain]
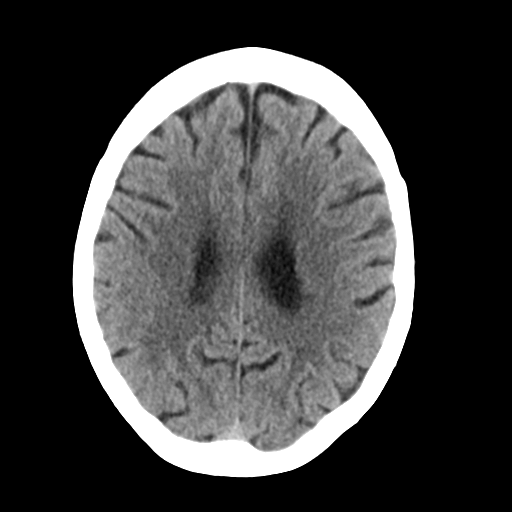
[im 19/31  bone]
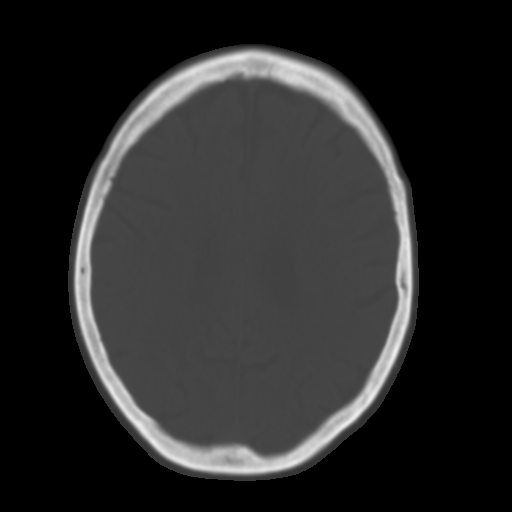
[im 23/31  brain]
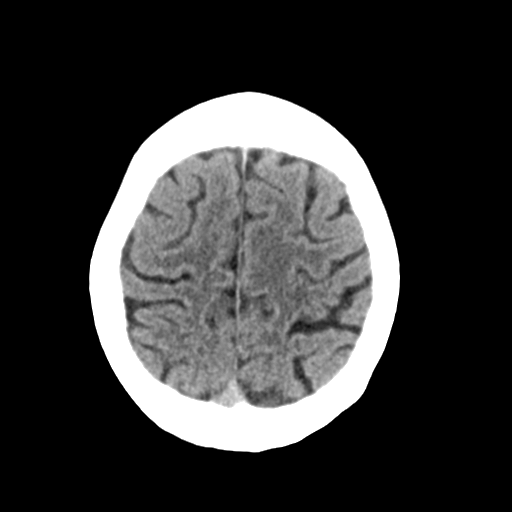
[im 27/31  brain]
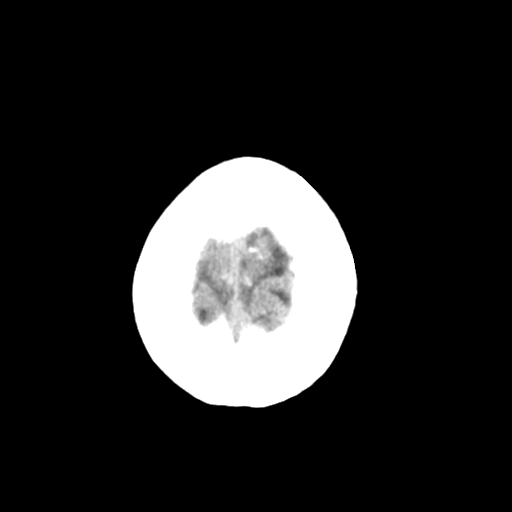

[Series 3: head bone · axial · 0.39mm/px · z∈[+382,+398]mm · 2 of 76 slices shown]
[im 8/76  bone]
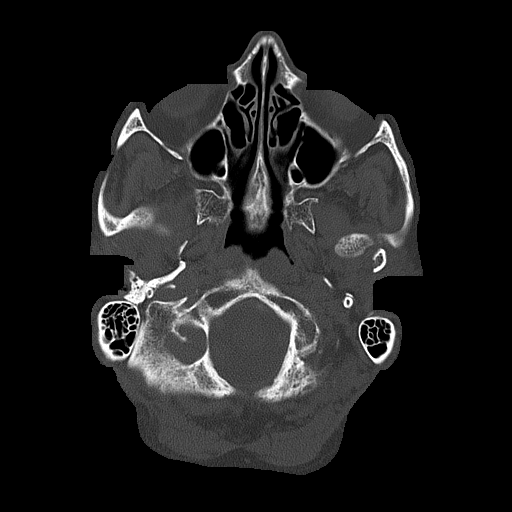
[im 16/76  bone]
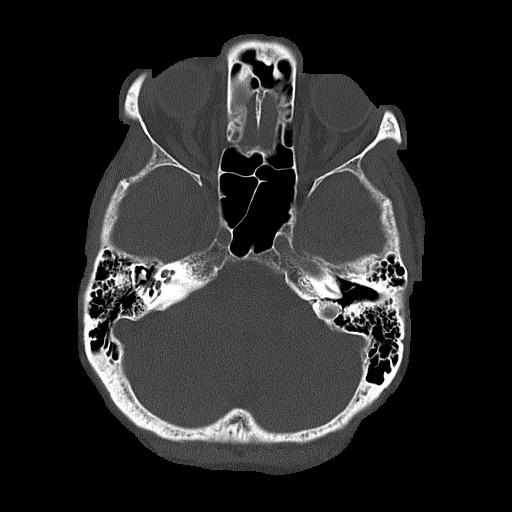

[Series 4: coronal soft tissue · coronal · 0.30mm/px · 3 of 65 slices shown]
[im 22/65  brain]
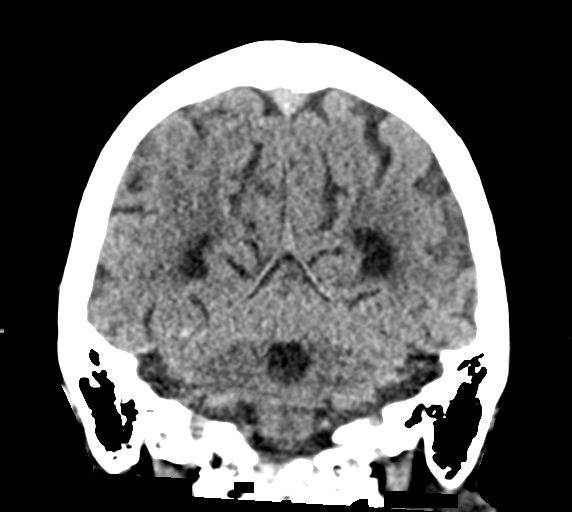
[im 29/65  brain]
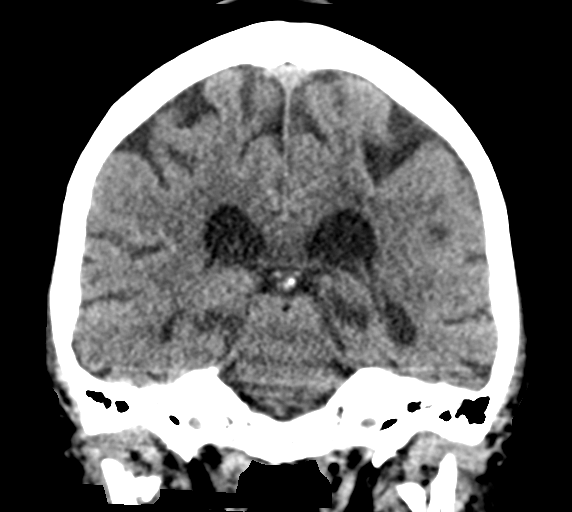
[im 36/65  brain]
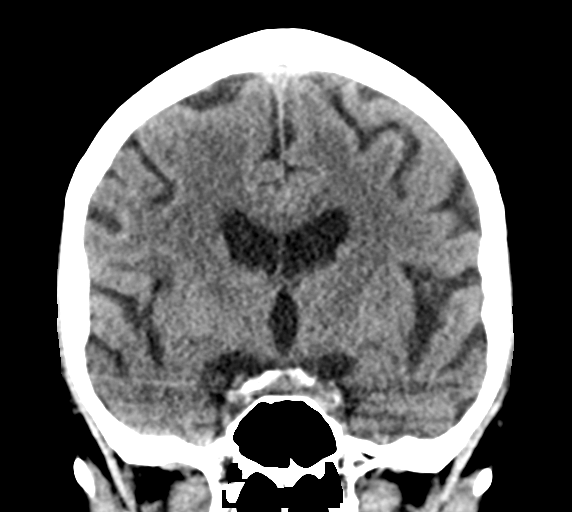

[Series 5: sagittal soft tissue · sagittal · 0.30mm/px · 3 of 57 slices shown]
[im 19/57  brain]
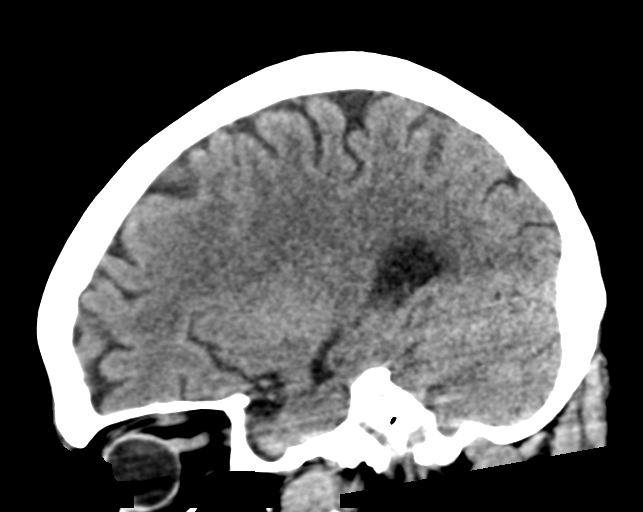
[im 29/57  brain]
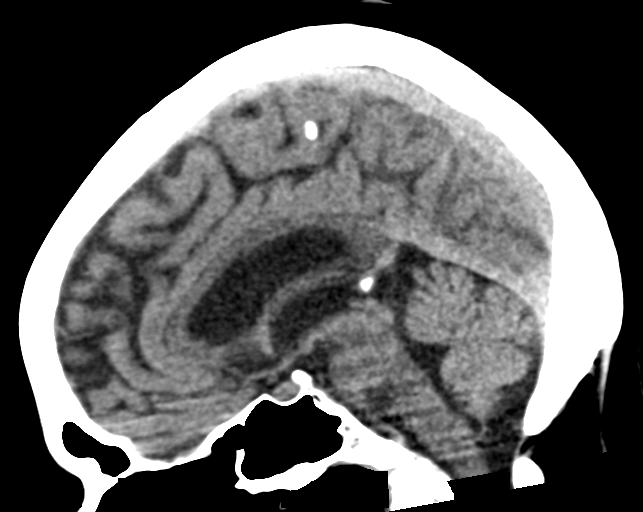
[im 38/57  brain]
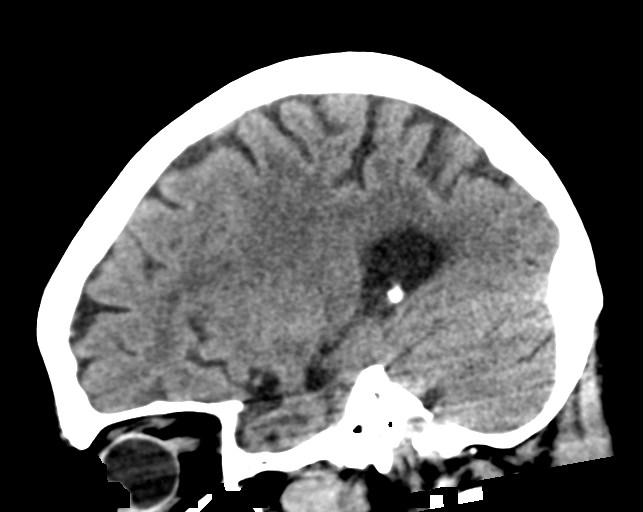

[15 of 47 positions shown; findings below may reference images not displayed]

FINDINGS: CT HEAD FINDINGS

Brain: No evidence of acute large vascular territory infarction,
hemorrhage, hydrocephalus, extra-axial collection or mass
lesion/mass effect. Patchy white matter hypoattenuation, nonspecific
but compatible with chronic microvascular ischemic disease. Atrophy
with ex vacuo ventricular dilation.

Vascular: No hyperdense vessel identified. Calcific intracranial
atherosclerosis.

Skull: No acute fracture.

Other: No mastoid effusions.

CT MAXILLOFACIAL FINDINGS

Osseous: Acute fractures at the base of bilateral mandibular
condyles with anterior displacement and dislocation of the condyles.

Orbits: Negative. No traumatic or inflammatory finding.

Sinuses: Clear.

Soft tissues: Negative.

CT CERVICAL SPINE FINDINGS

Alignment: Mild retrolisthesis C4 on C5, favor degenerative given
degenerative changes at this level. Otherwise, no substantial
sagittal subluxation.

Skull base and vertebrae: No evidence of acute fracture. Corticated
fragment along the left occipital condyle is favored degenerative or
secondary to remote trauma. Vertebral body heights are maintained.

Soft tissues and spinal canal: No prevertebral fluid or swelling. No
visible canal hematoma.

Disc levels: Multilevel degenerative disease, greatest at C4-C5.
Multilevel facet/uncovertebral hypertrophy with potentially moderate
foraminal stenosis bilaterally at C4-C5 and on the left at C5-C6.
Possibly moderate canal stenosis at C3-C4 and C4-C5.

Upper chest: Visualized lung apices are clear

Other: Small thyroid.
IMPRESSION: CT maxillofacial:

Acute fractures at the base of bilateral mandibular condyles with
anterior displacement and dislocation of the condyles.

CT head:

1. No evidence of acute intracranial abnormality
2. Chronic microvascular ischemic disease and atrophy.

CT cervical spine:

1. No evidence of acute fracture or traumatic malalignment.
2. Multilevel degenerative change, including potentially moderate
foraminal stenosis bilaterally at C4-C5 and on the left at C5-C6 and
potentially moderate canal stenosis C3-C4 and C4-C5. MRI of the
cervical spine could better evaluate the canal and foramina
clinically indicated.

## 2022-03-29 ENCOUNTER — Telehealth: Payer: Self-pay

## 2022-03-29 ENCOUNTER — Ambulatory Visit (INDEPENDENT_AMBULATORY_CARE_PROVIDER_SITE_OTHER): Payer: Medicare Other | Admitting: Internal Medicine

## 2022-03-29 ENCOUNTER — Encounter: Payer: Self-pay | Admitting: Internal Medicine

## 2022-03-29 VITALS — BP 132/84 | HR 69 | Temp 97.5°F | Ht 66.0 in | Wt 127.4 lb

## 2022-03-29 DIAGNOSIS — R634 Abnormal weight loss: Secondary | ICD-10-CM | POA: Diagnosis not present

## 2022-03-29 DIAGNOSIS — F01B4 Vascular dementia, moderate, with anxiety: Secondary | ICD-10-CM

## 2022-03-29 DIAGNOSIS — E538 Deficiency of other specified B group vitamins: Secondary | ICD-10-CM

## 2022-03-29 DIAGNOSIS — F5102 Adjustment insomnia: Secondary | ICD-10-CM | POA: Insufficient documentation

## 2022-03-29 DIAGNOSIS — F4321 Adjustment disorder with depressed mood: Secondary | ICD-10-CM | POA: Diagnosis not present

## 2022-03-29 DIAGNOSIS — E034 Atrophy of thyroid (acquired): Secondary | ICD-10-CM | POA: Diagnosis not present

## 2022-03-29 HISTORY — DX: Abnormal weight loss: R63.4

## 2022-03-29 HISTORY — DX: Adjustment insomnia: F51.02

## 2022-03-29 LAB — CBC WITH DIFFERENTIAL/PLATELET
Basophils Absolute: 0 10*3/uL (ref 0.0–0.1)
Basophils Relative: 0.5 % (ref 0.0–3.0)
Eosinophils Absolute: 0 10*3/uL (ref 0.0–0.7)
Eosinophils Relative: 0.6 % (ref 0.0–5.0)
HCT: 41.6 % (ref 36.0–46.0)
Hemoglobin: 13.5 g/dL (ref 12.0–15.0)
Lymphocytes Relative: 23.9 % (ref 12.0–46.0)
Lymphs Abs: 1.2 10*3/uL (ref 0.7–4.0)
MCHC: 32.5 g/dL (ref 30.0–36.0)
MCV: 95.2 fl (ref 78.0–100.0)
Monocytes Absolute: 0.5 10*3/uL (ref 0.1–1.0)
Monocytes Relative: 10.1 % (ref 3.0–12.0)
Neutro Abs: 3.2 10*3/uL (ref 1.4–7.7)
Neutrophils Relative %: 64.9 % (ref 43.0–77.0)
Platelets: 201 10*3/uL (ref 150.0–400.0)
RBC: 4.37 Mil/uL (ref 3.87–5.11)
RDW: 14.3 % (ref 11.5–15.5)
WBC: 5 10*3/uL (ref 4.0–10.5)

## 2022-03-29 LAB — COMPREHENSIVE METABOLIC PANEL
ALT: 12 U/L (ref 0–35)
AST: 16 U/L (ref 0–37)
Albumin: 4.6 g/dL (ref 3.5–5.2)
Alkaline Phosphatase: 65 U/L (ref 39–117)
BUN: 20 mg/dL (ref 6–23)
CO2: 30 mEq/L (ref 19–32)
Calcium: 9.4 mg/dL (ref 8.4–10.5)
Chloride: 106 mEq/L (ref 96–112)
Creatinine, Ser: 0.74 mg/dL (ref 0.40–1.20)
GFR: 77.32 mL/min (ref >60.00)
Glucose, Bld: 104 mg/dL — ABNORMAL HIGH (ref 70–99)
Potassium: 3.8 mEq/L (ref 3.5–5.1)
Sodium: 142 mEq/L (ref 135–145)
Total Bilirubin: 0.5 mg/dL (ref 0.2–1.2)
Total Protein: 7.3 g/dL (ref 6.0–8.3)

## 2022-03-29 LAB — B12 AND FOLATE PANEL
Folate: 13.2 ng/mL (ref >5.9)
Vitamin B-12: 219 pg/mL (ref 211–911)

## 2022-03-29 LAB — TSH: TSH: 1.12 u[IU]/mL (ref 0.35–5.50)

## 2022-03-29 NOTE — Assessment & Plan Note (Signed)
Secondary to loss of husband one month ago. Recommend trial of melatonin. She is alone at night,  Making sedatives unsalfe

## 2022-03-29 NOTE — Assessment & Plan Note (Signed)
" >>  ASSESSMENT AND PLAN FOR VASCULAR DEMENTIA (HCC) WRITTEN ON 03/29/2022  1:23 PM BY Perline Awe L, MD  She has been widowed since her last visit and now has a daytime aide until 7 pm. "

## 2022-03-29 NOTE — Telephone Encounter (Signed)
At [place], patient and her aide, Mabeline Caras, would like to verify that the Melatonin Dr. Deborra Medina mentioned, should be purchased over-the-counter.  I spoke with Everrett Coombe, CMA, and he verified the Melatonin is over-the-counter.

## 2022-03-29 NOTE — Assessment & Plan Note (Signed)
Recurrent, due to lapse in therapy.  IF ab was ordered last year and never done.

## 2022-03-29 NOTE — Assessment & Plan Note (Signed)
She has been widowed since her last visit and now has a daytime aide until 7 pm.

## 2022-03-29 NOTE — Assessment & Plan Note (Addendum)
Secondary to unexpected loss of husband on July 4.  She has not been eating or sleeping . She denies all  Behaviors reported by her aide and her neighbors.  She has had  20 lb weight loss  .  She continues to decline a transition to assisted living.

## 2022-03-29 NOTE — Assessment & Plan Note (Signed)
Secondary to husband's. death

## 2022-03-29 NOTE — Progress Notes (Signed)
Subjective:  Patient ID: Samantha Hanna, female    DOB: [birth year]  Age: 81 y.o. MRN: [id number]  CC: The primary encounter diagnosis was Hypothyroidism due to acquired atrophy of thyroid. Diagnoses of Moderate vascular dementia with anxiety (Samantha Hanna), Unintentional weight loss of 10% body weight within 6 months, Grief, B12 deficiency, and Insomnia due to psychological stress were also pertinent to this visit.   HPI Samantha Hanna presents for  Chief Complaint  Patient presents with   Insomnia    Insominia & stopped up nose    Grief:  husband Samantha Hanna passed away in the middle of the night on July 4 .  She found him the following morning.   Samantha Hanna is close by but Samantha Hanna is supporting from a distance.  She is accompanied by Samantha Hanna an aide  who worked with Samantha Hanna prior to his death  stays until 7 pm  . Family is working on getting an Armed forces technical officer. .  She is having a hard time,  not sleeping .  Not eating well.   She was treated and released by the ED on Dec 19 for facial trauma resulting in acute fractures at the base of bilateral mandibular condyles with anterior displacement and dislocation of the condyles.  The injury occurred during a fall  on the sidewalk after tripping over the curb.  She was advised to follow up with [facility] in one week after ENT on call physician  Samantha Hanna reviewed the CT report . She also broke her front teeth ,  which have been replaced. She continues to deny pain and states that she was told she did not need surgery     Outpatient Medications Prior to Visit  Medication Sig Dispense Refill   Omega-3 Fatty Acids (OMEGA 3 PO) Take 30 mLs by mouth daily. 800 mg of Fish Oil  per serving     SYNTHROID 75 MCG tablet TAKE 1 TABLET BY MOUTH DAILY BEFORE BREAKFAST. 90 tablet 3   acetaminophen (TYLENOL) 325 MG tablet Take 325 mg by mouth every 6 (six) hours as needed for mild pain or headache. (Patient not taking: Reported on 03/29/2022)     ezetimibe (ZETIA) 10 MG tablet Take 1 tablet  (10 mg total) by mouth daily. (Patient not taking: Reported on 03/29/2022) 90 tablet 3   fluticasone (FLONASE) 50 MCG/ACT nasal spray Place 2 sprays into both nostrils daily. (Patient not taking: Reported on 12/26/8411) 16 g 6   folic acid (FOLVITE) 244 MCG tablet Take 400 mcg by mouth daily. (Patient not taking: Reported on 03/29/2022)     Multiple Vitamin (MULTIVITAMIN WITH MINERALS) TABS tablet Take 1 tablet by mouth daily. (Patient not taking: Reported on 03/29/2022)     vitamin B-12 (CYANOCOBALAMIN) 1000 MCG tablet Take 1,000 mcg by mouth daily. (Patient not taking: Reported on 03/29/2022)     No facility-administered medications prior to visit.    Review of Systems;  Patient denies headache, fevers, malaise, unintentional weight loss, skin rash, eye pain, sinus congestion and sinus pain, sore throat, dysphagia,  hemoptysis , cough, dyspnea, wheezing, chest pain, palpitations, orthopnea, edema, abdominal pain, nausea, melena, diarrhea, constipation, flank pain, dysuria, hematuria, urinary  Frequency, nocturia, numbness, tingling, seizures,  Focal weakness, Loss of consciousness,  Tremor, insomnia, depression, anxiety, and suicidal ideation.      Objective:  BP 132/84 (BP Location: Left Arm, Patient Position: Sitting, Cuff Size: Small)   Pulse 69   Temp (!) 97.5 F (36.4 C) (Oral)   Ht 5'  6" (1.676 m)   Wt 127 lb 6.4 oz (57.8 kg)   SpO2 99%   BMI 20.56 kg/m   BP Readings from Last 3 Encounters:  03/29/22 132/84  08/13/21 (!) 142/99  01/01/21 (!) 158/78    Wt Readings from Last 3 Encounters:  03/29/22 127 lb 6.4 oz (57.8 kg)  02/05/22 145 lb (65.8 kg)  08/13/21 145 lb (65.8 kg)    General appearance: alert, cooperative and appears stated age Ears: normal TM's and external ear canals both ears Throat: lips, mucosa, and tongue normal; teeth and gums normal Neck: no adenopathy, no carotid bruit, supple, symmetrical, trachea midline and thyroid not enlarged, symmetric, no  tenderness/mass/nodules Back: symmetric, no curvature. ROM normal. No CVA tenderness. Lungs: clear to auscultation bilaterally Heart: regular rate and rhythm, S1, S2 normal, no murmur, click, rub or gallop Abdomen: soft, non-tender; bowel sounds normal; no masses,  no organomegaly Pulses: 2+ and symmetric Skin: Skin color, texture, turgor normal. No rashes or lesions Lymph nodes: Cervical, supraclavicular, and axillary nodes normal. Psych:  sad, but composed and articulate.  No results found for: "HGBA1C"  Lab Results  Component Value Date   CREATININE 0.74 03/29/2022   CREATININE 0.69 01/02/2021   CREATININE 0.72 04/19/2020    Lab Results  Component Value Date   WBC 5.0 03/29/2022   HGB 13.5 03/29/2022   HCT 41.6 03/29/2022   PLT 201.0 03/29/2022   GLUCOSE 104 (H) 03/29/2022   CHOL 254 (H) 01/02/2021   TRIG 76.0 01/02/2021   HDL 68.10 01/02/2021   LDLDIRECT 172.6 09/08/2013   LDLCALC 171 (H) 01/02/2021   ALT 12 03/29/2022   AST 16 03/29/2022   NA 142 03/29/2022   K 3.8 03/29/2022   CL 106 03/29/2022   CREATININE 0.74 03/29/2022   BUN 20 03/29/2022   CO2 30 03/29/2022   TSH 1.12 03/29/2022    CT HEAD WO CONTRAST (5MM)  Result Date: 08/13/2021 CLINICAL DATA:  Head trauma, moderate-severe; Neck trauma (Age >= 65y); Facial trauma, blunt EXAM: CT HEAD WITHOUT CONTRAST CT MAXILLOFACIAL WITHOUT CONTRAST CT CERVICAL SPINE WITHOUT CONTRAST TECHNIQUE: Multidetector CT imaging of the head, cervical spine, and maxillofacial structures were performed using the standard protocol without intravenous contrast. Multiplanar CT image reconstructions of the cervical spine and maxillofacial structures were also generated. COMPARISON:  MRI of the cervical spine 05/04/2015. MRI head 05/04/2015. FINDINGS: CT HEAD FINDINGS Brain: No evidence of acute large vascular territory infarction, hemorrhage, hydrocephalus, extra-axial collection or mass lesion/mass effect. Patchy white matter  hypoattenuation, nonspecific but compatible with chronic microvascular ischemic disease. Atrophy with ex vacuo ventricular dilation. Vascular: No hyperdense vessel identified. Calcific intracranial atherosclerosis. Skull: No acute fracture. Other: No mastoid effusions. CT MAXILLOFACIAL FINDINGS Osseous: Acute fractures at the base of bilateral mandibular condyles with anterior displacement and dislocation of the condyles. Orbits: Negative. No traumatic or inflammatory finding. Sinuses: Clear. Soft tissues: Negative. CT CERVICAL SPINE FINDINGS Alignment: Mild retrolisthesis C4 on C5, favor degenerative given degenerative changes at this level. Otherwise, no substantial sagittal subluxation. Skull base and vertebrae: No evidence of acute fracture. Corticated fragment along the left occipital condyle is favored degenerative or secondary to remote trauma. Vertebral body heights are maintained. Soft tissues and spinal canal: No prevertebral fluid or swelling. No visible canal hematoma. Disc levels: Multilevel degenerative disease, greatest at C4-C5. Multilevel facet/uncovertebral hypertrophy with potentially moderate foraminal stenosis bilaterally at C4-C5 and on the left at C5-C6. Possibly moderate canal stenosis at C3-C4 and C4-C5. Upper chest: Visualized lung apices are  clear Other: Small thyroid. IMPRESSION: CT maxillofacial: Acute fractures at the base of bilateral mandibular condyles with anterior displacement and dislocation of the condyles. CT head: 1. No evidence of acute intracranial abnormality 2. Chronic microvascular ischemic disease and atrophy. CT cervical spine: 1. No evidence of acute fracture or traumatic malalignment. 2. Multilevel degenerative change, including potentially moderate foraminal stenosis bilaterally at C4-C5 and on the left at C5-C6 and potentially moderate canal stenosis C3-C4 and C4-C5. MRI of the cervical spine could better evaluate the canal and foramina clinically indicated.  Electronically Signed   By: Samantha Hanna M.D.   On: 08/13/2021 16:15   CT Cervical Spine Wo Contrast  Result Date: 08/13/2021 CLINICAL DATA:  Head trauma, moderate-severe; Neck trauma (Age >= 65y); Facial trauma, blunt EXAM: CT HEAD WITHOUT CONTRAST CT MAXILLOFACIAL WITHOUT CONTRAST CT CERVICAL SPINE WITHOUT CONTRAST TECHNIQUE: Multidetector CT imaging of the head, cervical spine, and maxillofacial structures were performed using the standard protocol without intravenous contrast. Multiplanar CT image reconstructions of the cervical spine and maxillofacial structures were also generated. COMPARISON:  MRI of the cervical spine 05/04/2015. MRI head 05/04/2015. FINDINGS: CT HEAD FINDINGS Brain: No evidence of acute large vascular territory infarction, hemorrhage, hydrocephalus, extra-axial collection or mass lesion/mass effect. Patchy white matter hypoattenuation, nonspecific but compatible with chronic microvascular ischemic disease. Atrophy with ex vacuo ventricular dilation. Vascular: No hyperdense vessel identified. Calcific intracranial atherosclerosis. Skull: No acute fracture. Other: No mastoid effusions. CT MAXILLOFACIAL FINDINGS Osseous: Acute fractures at the base of bilateral mandibular condyles with anterior displacement and dislocation of the condyles. Orbits: Negative. No traumatic or inflammatory finding. Sinuses: Clear. Soft tissues: Negative. CT CERVICAL SPINE FINDINGS Alignment: Mild retrolisthesis C4 on C5, favor degenerative given degenerative changes at this level. Otherwise, no substantial sagittal subluxation. Skull base and vertebrae: No evidence of acute fracture. Corticated fragment along the left occipital condyle is favored degenerative or secondary to remote trauma. Vertebral body heights are maintained. Soft tissues and spinal canal: No prevertebral fluid or swelling. No visible canal hematoma. Disc levels: Multilevel degenerative disease, greatest at C4-C5. Multilevel  facet/uncovertebral hypertrophy with potentially moderate foraminal stenosis bilaterally at C4-C5 and on the left at C5-C6. Possibly moderate canal stenosis at C3-C4 and C4-C5. Upper chest: Visualized lung apices are clear Other: Small thyroid. IMPRESSION: CT maxillofacial: Acute fractures at the base of bilateral mandibular condyles with anterior displacement and dislocation of the condyles. CT head: 1. No evidence of acute intracranial abnormality 2. Chronic microvascular ischemic disease and atrophy. CT cervical spine: 1. No evidence of acute fracture or traumatic malalignment. 2. Multilevel degenerative change, including potentially moderate foraminal stenosis bilaterally at C4-C5 and on the left at C5-C6 and potentially moderate canal stenosis C3-C4 and C4-C5. MRI of the cervical spine could better evaluate the canal and foramina clinically indicated. Electronically Signed   By: Samantha Hanna M.D.   On: 08/13/2021 16:15   CT Maxillofacial Wo Contrast  Result Date: 08/13/2021 CLINICAL DATA:  Head trauma, moderate-severe; Neck trauma (Age >= 65y); Facial trauma, blunt EXAM: CT HEAD WITHOUT CONTRAST CT MAXILLOFACIAL WITHOUT CONTRAST CT CERVICAL SPINE WITHOUT CONTRAST TECHNIQUE: Multidetector CT imaging of the head, cervical spine, and maxillofacial structures were performed using the standard protocol without intravenous contrast. Multiplanar CT image reconstructions of the cervical spine and maxillofacial structures were also generated. COMPARISON:  MRI of the cervical spine 05/04/2015. MRI head 05/04/2015. FINDINGS: CT HEAD FINDINGS Brain: No evidence of acute large vascular territory infarction, hemorrhage, hydrocephalus, extra-axial collection or mass lesion/mass effect. Patchy white matter  hypoattenuation, nonspecific but compatible with chronic microvascular ischemic disease. Atrophy with ex vacuo ventricular dilation. Vascular: No hyperdense vessel identified. Calcific intracranial atherosclerosis.  Skull: No acute fracture. Other: No mastoid effusions. CT MAXILLOFACIAL FINDINGS Osseous: Acute fractures at the base of bilateral mandibular condyles with anterior displacement and dislocation of the condyles. Orbits: Negative. No traumatic or inflammatory finding. Sinuses: Clear. Soft tissues: Negative. CT CERVICAL SPINE FINDINGS Alignment: Mild retrolisthesis C4 on C5, favor degenerative given degenerative changes at this level. Otherwise, no substantial sagittal subluxation. Skull base and vertebrae: No evidence of acute fracture. Corticated fragment along the left occipital condyle is favored degenerative or secondary to remote trauma. Vertebral body heights are maintained. Soft tissues and spinal canal: No prevertebral fluid or swelling. No visible canal hematoma. Disc levels: Multilevel degenerative disease, greatest at C4-C5. Multilevel facet/uncovertebral hypertrophy with potentially moderate foraminal stenosis bilaterally at C4-C5 and on the left at C5-C6. Possibly moderate canal stenosis at C3-C4 and C4-C5. Upper chest: Visualized lung apices are clear Other: Small thyroid. IMPRESSION: CT maxillofacial: Acute fractures at the base of bilateral mandibular condyles with anterior displacement and dislocation of the condyles. CT head: 1. No evidence of acute intracranial abnormality 2. Chronic microvascular ischemic disease and atrophy. CT cervical spine: 1. No evidence of acute fracture or traumatic malalignment. 2. Multilevel degenerative change, including potentially moderate foraminal stenosis bilaterally at C4-C5 and on the left at C5-C6 and potentially moderate canal stenosis C3-C4 and C4-C5. MRI of the cervical spine could better evaluate the canal and foramina clinically indicated. Electronically Signed   By: Samantha Hanna M.D.   On: 08/13/2021 16:15    Assessment & Plan:   Problem List Items Addressed This Visit     Vascular dementia [facility])    She has been widowed since her last visit and  now has a daytime aide until 7 pm.      Unintentional weight loss of 10% body weight within 6 months    Secondary to husband's. death      Relevant Orders   CBC with Differential/Platelet (Completed)   Comprehensive metabolic panel (Completed)   B12 and Folate Panel (Completed)   Insomnia due to psychological stress    Secondary to loss of husband one month ago. Recommend trial of melatonin. She is alone at night,  Making sedatives unsalfe      Hypothyroidism - Primary    Thyroid function is WNL on current dose.  No current changes needed.   Lab Results  Component Value Date   TSH 1.12 03/29/2022         Relevant Orders   TSH (Completed)   Grief reaction    Secondary to unexpected loss of husband on July 4.  She has not been eating or sleeping . She denies all  Behaviors reported by her aide and her neighbors.  She has had  20 lb weight loss  .  She continues to decline a transition to assisted living.        B12 deficiency    Recurrent, due to lapse in therapy.  IF ab was ordered last year and never done.       Relevant Orders   Intrinsic Factor Antibodies    I spent a total of 34 minutes with this patient in a face to face visit on the date of this encounter reviewing the last office visit with me,  her last ER visit in December,   patient's current  diet and eating habits ( clarified by her aide) ,  and post visit ordering of testing and therapeutics.    Follow-up: No follow-ups on file.   Crecencio Mc, MD

## 2022-03-29 NOTE — Patient Instructions (Addendum)
I am so sorry to find out that Samantha Hanna passed.  I am comforted , as I know you are,  that he is with his [employer] and no longer struggling  You need to supplement your diet  because you have lost weight  Please consider adding a protein shake to your favorite ice cream in a blender and drinking it daily

## 2022-03-29 NOTE — Telephone Encounter (Signed)
I did relay this message to patient and her aide, Mabeline Caras.

## 2022-03-29 NOTE — Assessment & Plan Note (Signed)
Thyroid function is WNL on current dose.  No current changes needed.   Lab Results  Component Value Date   TSH 1.12 03/29/2022

## 2022-04-05 ENCOUNTER — Ambulatory Visit
Admission: RE | Admit: 2022-04-05 | Discharge: 2022-04-05 | Disposition: A | Discharge status: Home / Self Care | Payer: Medicare Other | Source: Ambulatory Visit | Attending: Internal Medicine | Admitting: Internal Medicine

## 2022-04-05 DIAGNOSIS — Z1231 Encounter for screening mammogram for malignant neoplasm of breast: Secondary | ICD-10-CM | POA: Diagnosis not present

## 2022-04-09 ENCOUNTER — Ambulatory Visit: Payer: Medicare Other

## 2022-04-09 ENCOUNTER — Ambulatory Visit (INDEPENDENT_AMBULATORY_CARE_PROVIDER_SITE_OTHER): Payer: Medicare Other

## 2022-04-09 ENCOUNTER — Inpatient Hospital Stay
Admission: RE | Admit: 2022-04-09 | Discharge: 2022-04-09 | Disposition: A | Discharge status: Home / Self Care | Payer: Self-pay | Source: Ambulatory Visit | Attending: *Deleted | Admitting: *Deleted

## 2022-04-09 ENCOUNTER — Other Ambulatory Visit: Payer: Self-pay | Admitting: Pediatrics

## 2022-04-09 ENCOUNTER — Other Ambulatory Visit: Payer: Self-pay | Admitting: *Deleted

## 2022-04-09 ENCOUNTER — Other Ambulatory Visit: Payer: Self-pay | Admitting: Internal Medicine

## 2022-04-09 DIAGNOSIS — Z1231 Encounter for screening mammogram for malignant neoplasm of breast: Secondary | ICD-10-CM

## 2022-04-09 DIAGNOSIS — N6489 Other specified disorders of breast: Secondary | ICD-10-CM

## 2022-04-09 DIAGNOSIS — E538 Deficiency of other specified B group vitamins: Secondary | ICD-10-CM

## 2022-04-09 DIAGNOSIS — R928 Other abnormal and inconclusive findings on diagnostic imaging of breast: Secondary | ICD-10-CM

## 2022-04-09 MED ORDER — CYANOCOBALAMIN 1000 MCG/ML IJ SOLN
1000.0000 ug | Freq: Once | INTRAMUSCULAR | Status: AC
  Administered 2022-04-09: 1000 ug via INTRAMUSCULAR

## 2022-04-09 NOTE — Progress Notes (Signed)
Pt presented today for a b12 injection. Left deltoid, IM. Pt voiced no concerns nor showed any signs of distress during injection.

## 2022-04-15 ENCOUNTER — Ambulatory Visit: Payer: Medicare Other | Admitting: Internal Medicine

## 2022-04-16 ENCOUNTER — Ambulatory Visit: Payer: Medicare Other

## 2022-04-16 ENCOUNTER — Other Ambulatory Visit: Payer: Medicare Other

## 2022-04-23 ENCOUNTER — Ambulatory Visit: Payer: Medicare Other

## 2022-04-24 ENCOUNTER — Ambulatory Visit: Payer: Medicare Other

## 2022-04-24 ENCOUNTER — Ambulatory Visit (INDEPENDENT_AMBULATORY_CARE_PROVIDER_SITE_OTHER): Payer: Medicare Other | Admitting: *Deleted

## 2022-04-24 DIAGNOSIS — E538 Deficiency of other specified B group vitamins: Secondary | ICD-10-CM | POA: Diagnosis not present

## 2022-04-24 MED ORDER — CYANOCOBALAMIN 1000 MCG/ML IJ SOLN
1000.0000 ug | Freq: Once | INTRAMUSCULAR | Status: AC
  Administered 2022-04-24: 1000 ug via INTRAMUSCULAR

## 2022-04-24 NOTE — Progress Notes (Signed)
Pt received B12 in right deltoid & tolerated it well with no concerns or complaints

## 2022-04-30 ENCOUNTER — Ambulatory Visit: Payer: Medicare Other

## 2022-05-02 ENCOUNTER — Ambulatory Visit
Admission: RE | Admit: 2022-05-02 | Discharge: 2022-05-02 | Disposition: A | Discharge status: Home / Self Care | Payer: Medicare Other | Source: Ambulatory Visit | Attending: Internal Medicine | Admitting: Internal Medicine

## 2022-05-02 DIAGNOSIS — N6489 Other specified disorders of breast: Secondary | ICD-10-CM | POA: Diagnosis not present

## 2022-05-02 DIAGNOSIS — R928 Other abnormal and inconclusive findings on diagnostic imaging of breast: Secondary | ICD-10-CM | POA: Diagnosis not present

## 2022-05-20 ENCOUNTER — Ambulatory Visit (INDEPENDENT_AMBULATORY_CARE_PROVIDER_SITE_OTHER): Payer: Self-pay | Admitting: Internal Medicine

## 2022-05-20 ENCOUNTER — Encounter: Payer: Self-pay | Admitting: Internal Medicine

## 2022-05-20 DIAGNOSIS — E538 Deficiency of other specified B group vitamins: Secondary | ICD-10-CM

## 2022-05-30 MED ORDER — CYANOCOBALAMIN 1000 MCG/ML IJ SOLN: 1000.0000 ug | Freq: Once | INTRAMUSCULAR | Status: DC

## 2022-05-30 NOTE — Progress Notes (Signed)
Patient failed to keep scheduled appointment and will be charged a no show fee.   

## 2022-06-12 DIAGNOSIS — D485 Neoplasm of uncertain behavior of skin: Secondary | ICD-10-CM | POA: Diagnosis not present

## 2022-06-12 DIAGNOSIS — D2262 Melanocytic nevi of left upper limb, including shoulder: Secondary | ICD-10-CM | POA: Diagnosis not present

## 2022-06-12 DIAGNOSIS — D0439 Carcinoma in situ of skin of other parts of face: Secondary | ICD-10-CM | POA: Diagnosis not present

## 2022-06-12 DIAGNOSIS — D2261 Melanocytic nevi of right upper limb, including shoulder: Secondary | ICD-10-CM | POA: Diagnosis not present

## 2022-06-12 DIAGNOSIS — D2272 Melanocytic nevi of left lower limb, including hip: Secondary | ICD-10-CM | POA: Diagnosis not present

## 2022-06-12 DIAGNOSIS — C44619 Basal cell carcinoma of skin of left upper limb, including shoulder: Secondary | ICD-10-CM | POA: Diagnosis not present

## 2022-06-12 DIAGNOSIS — Z85820 Personal history of malignant melanoma of skin: Secondary | ICD-10-CM | POA: Diagnosis not present

## 2022-06-12 DIAGNOSIS — Z85828 Personal history of other malignant neoplasm of skin: Secondary | ICD-10-CM | POA: Diagnosis not present

## 2022-06-12 DIAGNOSIS — D225 Melanocytic nevi of trunk: Secondary | ICD-10-CM | POA: Diagnosis not present

## 2022-06-12 DIAGNOSIS — C44719 Basal cell carcinoma of skin of left lower limb, including hip: Secondary | ICD-10-CM | POA: Diagnosis not present

## 2022-06-12 DIAGNOSIS — C44519 Basal cell carcinoma of skin of other part of trunk: Secondary | ICD-10-CM | POA: Diagnosis not present

## 2022-06-14 ENCOUNTER — Telehealth: Payer: Self-pay

## 2022-06-14 NOTE — Telephone Encounter (Signed)
Pt transferred to access nurse due to pt c/o having diarrhea w/pain & dizziness. Pt states diarrhea starts after eating a meal, this has been ongoing x1 wk. Per access she has been referred back to PCP for further instructions.

## 2022-06-14 NOTE — Telephone Encounter (Signed)
Patient's daughter, Lyda Jester, intermittent diarrhea with pain that makes her double over.  I transferred call to Access Nurse.

## 2022-06-17 ENCOUNTER — Other Ambulatory Visit: Payer: Self-pay | Admitting: Internal Medicine

## 2022-06-20 ENCOUNTER — Telehealth: Payer: Self-pay | Admitting: Internal Medicine

## 2022-06-20 DIAGNOSIS — F01B4 Vascular dementia, moderate, with anxiety: Secondary | ICD-10-CM

## 2022-06-20 NOTE — Telephone Encounter (Signed)
Pt daughter is calling about the pt being referred to [facility] memory and cognitive disorder clinic. Pt daughter would like to be called [phone]

## 2022-06-21 NOTE — Telephone Encounter (Signed)
Is it okay to lace the referral?

## 2022-06-21 NOTE — Addendum Note (Signed)
Addended by: Crecencio Mc on: 06/21/2022 05:21 PM   Modules accepted: Orders

## 2022-06-21 NOTE — Telephone Encounter (Signed)
REFERRAL HAS BEEN MADE, BUT PATIENT HAS DECLINED PREVIOUS REFERRALS TO NEUROLOGY., SO UNLESS DAUGHTER HANDLES MAKING THE APPOINTMENT THE PATIENT WILL LIKELY NOT SCHEDULE

## 2022-06-24 NOTE — Telephone Encounter (Signed)
LMTCB. Need to let pt's daughter know that the requested referral has been placed.

## 2022-06-26 NOTE — Telephone Encounter (Signed)
Left a detailed message letting pt's daughter know that referral has been placed.

## 2022-07-26 DIAGNOSIS — Z23 Encounter for immunization: Secondary | ICD-10-CM | POA: Diagnosis not present

## 2022-08-06 NOTE — Telephone Encounter (Signed)
Error

## 2022-08-15 DIAGNOSIS — C44519 Basal cell carcinoma of skin of other part of trunk: Secondary | ICD-10-CM | POA: Diagnosis not present

## 2022-08-15 DIAGNOSIS — C44619 Basal cell carcinoma of skin of left upper limb, including shoulder: Secondary | ICD-10-CM | POA: Diagnosis not present

## 2022-08-15 DIAGNOSIS — C44719 Basal cell carcinoma of skin of left lower limb, including hip: Secondary | ICD-10-CM | POA: Diagnosis not present

## 2022-08-16 DIAGNOSIS — Z23 Encounter for immunization: Secondary | ICD-10-CM | POA: Diagnosis not present

## 2022-09-06 DIAGNOSIS — L244 Irritant contact dermatitis due to drugs in contact with skin: Secondary | ICD-10-CM | POA: Diagnosis not present

## 2022-12-18 DIAGNOSIS — D485 Neoplasm of uncertain behavior of skin: Secondary | ICD-10-CM | POA: Diagnosis not present

## 2022-12-18 DIAGNOSIS — Z85820 Personal history of malignant melanoma of skin: Secondary | ICD-10-CM | POA: Diagnosis not present

## 2022-12-18 DIAGNOSIS — D225 Melanocytic nevi of trunk: Secondary | ICD-10-CM | POA: Diagnosis not present

## 2022-12-18 DIAGNOSIS — Z86006 Personal history of melanoma in-situ: Secondary | ICD-10-CM | POA: Diagnosis not present

## 2022-12-18 DIAGNOSIS — D2261 Melanocytic nevi of right upper limb, including shoulder: Secondary | ICD-10-CM | POA: Diagnosis not present

## 2022-12-18 DIAGNOSIS — Z85828 Personal history of other malignant neoplasm of skin: Secondary | ICD-10-CM | POA: Diagnosis not present

## 2022-12-18 DIAGNOSIS — C44519 Basal cell carcinoma of skin of other part of trunk: Secondary | ICD-10-CM | POA: Diagnosis not present

## 2022-12-18 DIAGNOSIS — D2272 Melanocytic nevi of left lower limb, including hip: Secondary | ICD-10-CM | POA: Diagnosis not present

## 2022-12-18 DIAGNOSIS — Z872 Personal history of diseases of the skin and subcutaneous tissue: Secondary | ICD-10-CM | POA: Diagnosis not present

## 2022-12-18 DIAGNOSIS — D0439 Carcinoma in situ of skin of other parts of face: Secondary | ICD-10-CM | POA: Diagnosis not present

## 2023-01-08 ENCOUNTER — Telehealth: Payer: Self-pay | Admitting: Internal Medicine

## 2023-01-08 NOTE — Telephone Encounter (Signed)
Contacted Consuela Mimes to schedule their annual wellness visit. Appointment made for 02/19/2023.  Verlee Rossetti; Care Guide Ambulatory Clinical Support [place] [facility] Direct Dial: [phone]

## 2023-01-10 ENCOUNTER — Telehealth: Payer: Self-pay | Admitting: Internal Medicine

## 2023-01-10 NOTE — Telephone Encounter (Signed)
Morrell Riddle (caregiver) called stating pt has abdominal pain and dizziness for three days sent to access nurse

## 2023-01-10 NOTE — Telephone Encounter (Signed)
Morrell Riddle returned Shiro CMA call. Transferred.

## 2023-01-10 NOTE — Telephone Encounter (Signed)
Samantha Hanna called stating she was talking to the nurse and then she got transferred back to make an appointment and the provider does not have any appointments until june

## 2023-01-10 NOTE — Telephone Encounter (Signed)
See message below °

## 2023-01-10 NOTE — Telephone Encounter (Signed)
LMTCB

## 2023-01-10 NOTE — Telephone Encounter (Signed)
Spoke with pt's caregiver and scheduled her for an appointment on Tuesday. Caregiver was advised that if symptoms worsen over the weekend then she will need to be evaluated at [facility] or the ED.

## 2023-01-14 ENCOUNTER — Ambulatory Visit: Payer: Medicare Other | Admitting: Nurse Practitioner

## 2023-01-16 ENCOUNTER — Ambulatory Visit (INDEPENDENT_AMBULATORY_CARE_PROVIDER_SITE_OTHER): Payer: Medicare Other | Admitting: Nurse Practitioner

## 2023-01-16 ENCOUNTER — Encounter: Payer: Self-pay | Admitting: Nurse Practitioner

## 2023-01-16 VITALS — BP 128/78 | HR 69 | Temp 97.9°F | Ht 67.0 in | Wt 135.8 lb

## 2023-01-16 DIAGNOSIS — R42 Dizziness and giddiness: Secondary | ICD-10-CM

## 2023-01-16 NOTE — Progress Notes (Signed)
Established Patient Office Visit  Subjective:  Patient ID: Samantha Hanna, female    DOB: 1940-09-22  Age: 82 y.o. MRN: [id number]  CC:  Chief Complaint  Patient presents with   Dizziness    HPI  Samantha Hanna presents for dizziness and abdominal pain  last week. She is accompanied by her caregiver Samantha Hanna. She is feeling better today no dizziness or abdominal pain today.    She is taking synthroid daily.   HPI   Past Medical History:  Diagnosis Date   Arthritis    Cancer (HCC)    SKIN CANCER-BASAL CELL   GERD (gastroesophageal reflux disease)    Hyperlipidemia    Hypothyroidism    Thyroid disease     Past Surgical History:  Procedure Laterality Date   APPENDECTOMY     CARPOMETACARPAL (CMC) FUSION OF THUMB Left 09/10/2017   Procedure: CARPOMETACARPAL (CMC) FUSION OF THUMB;  Surgeon: Deeann Saint, MD;  Location: [facility] ORS;  Service: Orthopedics;  Laterality: Left;   MELANOMA EXCISION     x 2, Dr. Jarold Motto    Family History  Problem Relation Age of Onset   Osteoporosis Mother        Deceased, 57   Coronary artery disease Father 77       Deceased, 58 in MVA   Heart Problems Father    Arthritis Daughter 57       Rheumatiod   Healthy Daughter     Social History   Socioeconomic History   Marital status: Married    Spouse name: Not on file   Number of children: Not on file   Years of education: Not on file   Highest education level: Not on file  Occupational History   Not on file  Tobacco Use   Smoking status: Never   Smokeless tobacco: Never  Vaping Use   Vaping Use: Never used  Substance and Sexual Activity   Alcohol use: Yes    Alcohol/week: 3.0 standard drinks of alcohol    Types: 3 Standard drinks or equivalent per week    Comment: social   Drug use: No   Sexual activity: Yes  Other Topics Concern   Not on file  Social History Narrative   She lives with husband (optometrist) who has Parkinson's disease.  They have two grown children.   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    Highest level of education:  Energy manager   Social Determinants of Health   Financial Resource Strain: Low Risk  (02/05/2022)   Overall Financial Resource Strain (CARDIA)    Difficulty of Paying Living Expenses: Not hard at all  Food Insecurity: No Food Insecurity (02/05/2022)   Hunger Vital Sign    Worried About Running Out of Food in the Last Year: Never true    Ran Out of Food in the Last Year: Never true  Transportation Needs: No Transportation Needs (02/05/2022)   PRAPARE - Administrator, Civil Service (Medical): No    Lack of Transportation (Non-Medical): No  Physical Activity: Sufficiently Active (02/05/2022)   Exercise Vital Sign    Days of Exercise per Week: 5 days    Minutes of Exercise per Session: 30 min  Stress: No Stress Concern Present (02/05/2022)   [employer] of Occupational Health - Occupational Stress Questionnaire    Feeling of Stress : Not at all  Social Connections: Unknown (02/05/2022)   Social Connection and Isolation Panel [NHANES]  Frequency of Communication with Friends and Family: More than three times a week    Frequency of Social Gatherings with Friends and Family: More than three times a week    Attends Religious Services: Not on file    Active Member of Clubs or Organizations: Yes    Attends Banker Meetings: Not on file    Marital Status: Married  Intimate Partner Violence: Not At Risk (02/05/2022)   Humiliation, Afraid, Rape, and Kick questionnaire    Fear of Current or Ex-Partner: No    Emotionally Abused: No    Physically Abused: No    Sexually Abused: No     Outpatient Medications Prior to Visit  Medication Sig Dispense Refill   Omega-3 Fatty Acids (OMEGA 3 PO) Take 30 mLs by mouth daily. 800 mg of Fish Oil  per serving     SYNTHROID 75 MCG tablet TAKE 1 TABLET BY MOUTH DAILY BEFORE BREAKFAST. 90 tablet 3   No facility-administered  medications prior to visit.    Allergies  Allergen Reactions   Hydrocodone     Hallucination   Shellfish Allergy Itching   Statins Other (See Comments)    Unknown    Penicillins Rash    Has patient had a PCN reaction causing immediate rash, facial/tongue/throat swelling, SOB or lightheadedness with hypotension: No Has patient had a PCN reaction causing severe rash involving mucus membranes or skin necrosis: No Has patient had a PCN reaction that required hospitalization: No Has patient had a PCN reaction occurring within the last 10 years: No If all of the above answers are "NO", then may proceed with Cephalosporin use.     ROS Review of Systems  Constitutional: Negative.   HENT: Negative.    Eyes: Negative.   Respiratory:  Negative for chest tightness.   Cardiovascular: Negative.   Gastrointestinal: Negative.   Genitourinary: Negative.   Musculoskeletal: Negative.   Neurological: Negative.   Psychiatric/Behavioral: Negative.        Objective:    Physical Exam Constitutional:      Appearance: Normal appearance. She is obese.  HENT:     Head: Normocephalic.     Right Ear: Tympanic membrane normal.     Left Ear: Tympanic membrane normal.     Nose: Nose normal.     Mouth/Throat:     Mouth: Mucous membranes are moist.     Pharynx: Oropharynx is clear.  Eyes:     Extraocular Movements: Extraocular movements intact.     Conjunctiva/sclera: Conjunctivae normal.     Pupils: Pupils are equal, round, and reactive to light.  Cardiovascular:     Rate and Rhythm: Normal rate and regular rhythm.     Pulses: Normal pulses.     Heart sounds: Normal heart sounds.  Pulmonary:     Effort: Pulmonary effort is normal. No respiratory distress.     Breath sounds: Normal breath sounds. No rhonchi.  Abdominal:     General: Bowel sounds are normal.     Palpations: Abdomen is soft. There is no mass.     Tenderness: There is no abdominal tenderness.     Hernia: No hernia is present.   Musculoskeletal:        General: Normal range of motion.     Cervical back: Neck supple. No tenderness.  Skin:    General: Skin is warm.     Capillary Refill: Capillary refill takes less than 2 seconds.  Neurological:     General: No focal deficit present.  Mental Status: She is alert and oriented to person, place, and time. Mental status is at baseline.  Psychiatric:        Mood and Affect: Mood normal.        Behavior: Behavior normal.        Thought Content: Thought content normal.        Judgment: Judgment normal.     BP 128/78   Pulse 69   Temp 97.9 F (36.6 C) (Oral)   Ht 5\' 7"  (1.702 m)   Wt 135 lb 12.8 oz (61.6 kg)   SpO2 99%   BMI 21.27 kg/m  Wt Readings from Last 3 Encounters:  01/16/23 135 lb 12.8 oz (61.6 kg)  03/29/22 127 lb 6.4 oz (57.8 kg)  02/05/22 145 lb (65.8 kg)     Health Maintenance  Topic Date Due   Hepatitis C Screening  Never done   COVID-19 Vaccine (4 - 2023-24 season) 04/26/2022   Medicare Annual Wellness (AWV)  02/06/2023   INFLUENZA VACCINE  03/27/2023   MAMMOGRAM  04/06/2023   DTaP/Tdap/Td (3 - Td or Tdap) 08/14/2031   Pneumonia Vaccine 30+ Years old  Completed   DEXA SCAN  Completed   Zoster Vaccines- Shingrix  Completed   HPV VACCINES  Aged Out   Fecal DNA (Cologuard)  Discontinued    There are no preventive care reminders to display for this patient.  Lab Results  Component Value Date   TSH 1.12 03/29/2022   Lab Results  Component Value Date   WBC 5.0 03/29/2022   HGB 13.5 03/29/2022   HCT 41.6 03/29/2022   MCV 95.2 03/29/2022   PLT 201.0 03/29/2022   Lab Results  Component Value Date   NA 142 03/29/2022   K 3.8 03/29/2022   CO2 30 03/29/2022   GLUCOSE 104 (H) 03/29/2022   BUN 20 03/29/2022   CREATININE 0.74 03/29/2022   BILITOT 0.5 03/29/2022   ALKPHOS 65 03/29/2022   AST 16 03/29/2022   ALT 12 03/29/2022   PROT 7.3 03/29/2022   ALBUMIN 4.6 03/29/2022   CALCIUM 9.4 03/29/2022   GFR 77.32 03/29/2022    Lab Results  Component Value Date   CHOL 254 (H) 01/02/2021   Lab Results  Component Value Date   HDL 68.10 01/02/2021   Lab Results  Component Value Date   LDLCALC 171 (H) 01/02/2021   Lab Results  Component Value Date   TRIG 76.0 01/02/2021   Lab Results  Component Value Date   CHOLHDL 4 01/02/2021   No results found for: "HGBA1C"    Assessment & Plan:  Dizziness Assessment & Plan: Vitals reassuring and dizziness resolved at present. Advice to increase fluid intake. Advise to change position slowly.     Follow-up: Return if symptoms worsen or fail to improve.   Kara Dies, NP

## 2023-01-16 NOTE — Patient Instructions (Signed)
Nice to meet you today. Keep a dairy for abdominal pain. Change position slowly.

## 2023-01-24 DIAGNOSIS — R42 Dizziness and giddiness: Secondary | ICD-10-CM | POA: Insufficient documentation

## 2023-01-24 HISTORY — DX: Dizziness and giddiness: R42

## 2023-01-24 NOTE — Assessment & Plan Note (Addendum)
Vitals reassuring and dizziness resolved at present. Advice to increase fluid intake. Advise to change position slowly.

## 2023-01-31 ENCOUNTER — Telehealth: Payer: Self-pay | Admitting: Internal Medicine

## 2023-01-31 NOTE — Telephone Encounter (Signed)
Kira care giver called in stating that pt needs a tb shot as long as some FL2 paperwork to be fill asap. She was looking for an appt for Monday with provider. Theres no available openings, she will drop off the form on Monday. Does pt needs to come ans see provider? About the tb?? Please advice.
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
## 2023-02-03 ENCOUNTER — Telehealth: Payer: Self-pay | Admitting: Internal Medicine

## 2023-02-03 DIAGNOSIS — Z0279 Encounter for issue of other medical certificate: Secondary | ICD-10-CM

## 2023-02-03 NOTE — Telephone Encounter (Signed)
Completed what I could and placed in red folder.

## 2023-02-03 NOTE — Telephone Encounter (Signed)
Patient's caregiver dropped off FL2 form and diet plan to be completed by Dr Darrick Huntsman. Form is up front in Tullo's color folder.

## 2023-02-03 NOTE — Telephone Encounter (Signed)
Is it okay to schedule a tb skin test?

## 2023-02-03 NOTE — Telephone Encounter (Signed)
Abigail from [facility] called in stating that they the [facility] form back by Wednesday. She sated it was also fax over. They need urgent to put pt in memory care. Any questions, she's available @ [phone]

## 2023-02-04 NOTE — Telephone Encounter (Signed)
FL2, diet , med list and last 4 visits fax to [phone] [facility].

## 2023-02-04 NOTE — Telephone Encounter (Signed)
I have faxed all requested information.

## 2023-02-04 NOTE — Telephone Encounter (Signed)
A nurse from [facility] called in stating that pt might be coming in to their fcility on Friday, so they need recents OV summary, lists of meds, and last physical visit fax over to them. Her fax number its [phone].

## 2023-02-04 NOTE — Telephone Encounter (Signed)
noted 

## 2023-02-04 NOTE — Telephone Encounter (Signed)
I have attempted to call [facility] several times to find out if the tb skin test is still needed. No answer no voicemail.

## 2023-02-04 NOTE — Telephone Encounter (Signed)
Pt daughter called in stating that the memory assisted facility also wants for pt to have med Remarain and Ceroquil for anxiety.

## 2023-02-04 NOTE — Telephone Encounter (Signed)
Patient's daughter, Samantha Hanna called. She hates to be a pest about getting FL2 form completed, but she really needs it by Wednesday. Memory care will not hold room for Friday. Please call Laure at [phone].

## 2023-02-05 ENCOUNTER — Other Ambulatory Visit: Payer: Self-pay | Admitting: Family

## 2023-02-05 NOTE — Telephone Encounter (Signed)
Pt daughter called stating pt need remeron for sleep and appetite and also seroquel medication sent to [facility] by Friday. The facility need it for the pt because she is moving in on Friday

## 2023-02-06 NOTE — Telephone Encounter (Addendum)
Advised daughter Samantha Hanna that PCP out of office we have never prescribed these medications and because facility is stating the want patient to take these medications we cannot prescribe without seeing patient . Facility wanting PCP  to prescribe Remeron and Seroquel   if PCP was in office these medications would need OV due to patient has not been seen BY PCP since 9/23,. POA is up set because she was not advised of this on Monday advised POA that medications were not mentioned until 02/04/23 at 3:30 and provider was out of office on 02/05/2023. Patient also has voiced she doe snot want to see PCP because she advised her she has dementia per daughter. Daughter also advised that facility doctor can prescribe medication on admission to memory care. Patient going to memory care will be seeing provider there according to daughter.

## 2023-02-06 NOTE — Telephone Encounter (Signed)
Patient's daughter call Vernona Rieger, [phone], POA. Evelene Croon saw her 2 weeks ago. She needs this medication by tomorrow.

## 2023-02-06 NOTE — Telephone Encounter (Signed)
Called POA number to advise medication were not prescribed by this office no answer left voicemail to call office.

## 2023-02-06 NOTE — Telephone Encounter (Signed)
Spoke with [facility] and was able to get a different fax number to fax over the [facility], diet plan, med list and office notes they requested.

## 2023-02-07 ENCOUNTER — Ambulatory Visit: Payer: Medicare Other

## 2023-02-12 DIAGNOSIS — E785 Hyperlipidemia, unspecified: Secondary | ICD-10-CM | POA: Diagnosis not present

## 2023-02-12 DIAGNOSIS — F01B4 Vascular dementia, moderate, with anxiety: Secondary | ICD-10-CM | POA: Diagnosis not present

## 2023-02-12 DIAGNOSIS — G4700 Insomnia, unspecified: Secondary | ICD-10-CM | POA: Diagnosis not present

## 2023-02-12 DIAGNOSIS — F411 Generalized anxiety disorder: Secondary | ICD-10-CM | POA: Diagnosis not present

## 2023-02-12 DIAGNOSIS — E039 Hypothyroidism, unspecified: Secondary | ICD-10-CM | POA: Diagnosis not present

## 2023-02-15 DIAGNOSIS — R2681 Unsteadiness on feet: Secondary | ICD-10-CM | POA: Diagnosis not present

## 2023-02-15 DIAGNOSIS — M6281 Muscle weakness (generalized): Secondary | ICD-10-CM | POA: Diagnosis not present

## 2023-02-18 DIAGNOSIS — M6281 Muscle weakness (generalized): Secondary | ICD-10-CM | POA: Diagnosis not present

## 2023-02-18 DIAGNOSIS — R2681 Unsteadiness on feet: Secondary | ICD-10-CM | POA: Diagnosis not present

## 2023-02-19 DIAGNOSIS — M6281 Muscle weakness (generalized): Secondary | ICD-10-CM | POA: Diagnosis not present

## 2023-02-20 DIAGNOSIS — D519 Vitamin B12 deficiency anemia, unspecified: Secondary | ICD-10-CM | POA: Diagnosis not present

## 2023-02-20 DIAGNOSIS — F411 Generalized anxiety disorder: Secondary | ICD-10-CM | POA: Diagnosis not present

## 2023-02-26 DIAGNOSIS — F01B4 Vascular dementia, moderate, with anxiety: Secondary | ICD-10-CM | POA: Diagnosis not present

## 2023-02-26 DIAGNOSIS — F411 Generalized anxiety disorder: Secondary | ICD-10-CM | POA: Diagnosis not present

## 2023-02-26 DIAGNOSIS — R2681 Unsteadiness on feet: Secondary | ICD-10-CM | POA: Diagnosis not present

## 2023-02-26 DIAGNOSIS — G4700 Insomnia, unspecified: Secondary | ICD-10-CM | POA: Diagnosis not present

## 2023-02-26 DIAGNOSIS — M6281 Muscle weakness (generalized): Secondary | ICD-10-CM | POA: Diagnosis not present

## 2023-02-27 DIAGNOSIS — R2681 Unsteadiness on feet: Secondary | ICD-10-CM | POA: Diagnosis not present

## 2023-02-27 DIAGNOSIS — M6281 Muscle weakness (generalized): Secondary | ICD-10-CM | POA: Diagnosis not present

## 2023-03-03 DIAGNOSIS — R2681 Unsteadiness on feet: Secondary | ICD-10-CM | POA: Diagnosis not present

## 2023-03-03 DIAGNOSIS — M6281 Muscle weakness (generalized): Secondary | ICD-10-CM | POA: Diagnosis not present

## 2023-03-05 DIAGNOSIS — E785 Hyperlipidemia, unspecified: Secondary | ICD-10-CM | POA: Diagnosis not present

## 2023-03-05 DIAGNOSIS — G4700 Insomnia, unspecified: Secondary | ICD-10-CM | POA: Diagnosis not present

## 2023-03-05 DIAGNOSIS — F411 Generalized anxiety disorder: Secondary | ICD-10-CM | POA: Diagnosis not present

## 2023-03-05 DIAGNOSIS — F01B4 Vascular dementia, moderate, with anxiety: Secondary | ICD-10-CM | POA: Diagnosis not present

## 2023-03-05 DIAGNOSIS — D519 Vitamin B12 deficiency anemia, unspecified: Secondary | ICD-10-CM | POA: Diagnosis not present

## 2023-03-05 DIAGNOSIS — E039 Hypothyroidism, unspecified: Secondary | ICD-10-CM | POA: Diagnosis not present

## 2023-03-10 DIAGNOSIS — M6281 Muscle weakness (generalized): Secondary | ICD-10-CM | POA: Diagnosis not present

## 2023-03-10 DIAGNOSIS — R2681 Unsteadiness on feet: Secondary | ICD-10-CM | POA: Diagnosis not present

## 2023-03-12 DIAGNOSIS — G4700 Insomnia, unspecified: Secondary | ICD-10-CM | POA: Diagnosis not present

## 2023-03-12 DIAGNOSIS — F411 Generalized anxiety disorder: Secondary | ICD-10-CM | POA: Diagnosis not present

## 2023-03-12 DIAGNOSIS — F01B4 Vascular dementia, moderate, with anxiety: Secondary | ICD-10-CM | POA: Diagnosis not present

## 2023-03-16 DIAGNOSIS — M6281 Muscle weakness (generalized): Secondary | ICD-10-CM | POA: Diagnosis not present

## 2023-03-16 DIAGNOSIS — R2681 Unsteadiness on feet: Secondary | ICD-10-CM | POA: Diagnosis not present

## 2023-03-17 DIAGNOSIS — R2681 Unsteadiness on feet: Secondary | ICD-10-CM | POA: Diagnosis not present

## 2023-03-17 DIAGNOSIS — M6281 Muscle weakness (generalized): Secondary | ICD-10-CM | POA: Diagnosis not present

## 2023-03-18 DIAGNOSIS — R4182 Altered mental status, unspecified: Secondary | ICD-10-CM | POA: Diagnosis not present

## 2023-03-19 DIAGNOSIS — R462 Strange and inexplicable behavior: Secondary | ICD-10-CM | POA: Diagnosis not present

## 2023-03-19 DIAGNOSIS — F01B4 Vascular dementia, moderate, with anxiety: Secondary | ICD-10-CM | POA: Diagnosis not present

## 2023-03-19 DIAGNOSIS — F411 Generalized anxiety disorder: Secondary | ICD-10-CM | POA: Diagnosis not present

## 2023-03-19 DIAGNOSIS — G4700 Insomnia, unspecified: Secondary | ICD-10-CM | POA: Diagnosis not present

## 2023-03-24 DIAGNOSIS — R2681 Unsteadiness on feet: Secondary | ICD-10-CM | POA: Diagnosis not present

## 2023-03-24 DIAGNOSIS — M6281 Muscle weakness (generalized): Secondary | ICD-10-CM | POA: Diagnosis not present

## 2023-03-26 DIAGNOSIS — M6281 Muscle weakness (generalized): Secondary | ICD-10-CM | POA: Diagnosis not present

## 2023-03-26 DIAGNOSIS — D519 Vitamin B12 deficiency anemia, unspecified: Secondary | ICD-10-CM | POA: Diagnosis not present

## 2023-03-26 DIAGNOSIS — F01B4 Vascular dementia, moderate, with anxiety: Secondary | ICD-10-CM | POA: Diagnosis not present

## 2023-03-26 DIAGNOSIS — N390 Urinary tract infection, site not specified: Secondary | ICD-10-CM | POA: Diagnosis not present

## 2023-03-26 DIAGNOSIS — E039 Hypothyroidism, unspecified: Secondary | ICD-10-CM | POA: Diagnosis not present

## 2023-03-26 DIAGNOSIS — R2681 Unsteadiness on feet: Secondary | ICD-10-CM | POA: Diagnosis not present

## 2023-03-26 DIAGNOSIS — G4700 Insomnia, unspecified: Secondary | ICD-10-CM | POA: Diagnosis not present

## 2023-03-26 DIAGNOSIS — E785 Hyperlipidemia, unspecified: Secondary | ICD-10-CM | POA: Diagnosis not present

## 2023-03-26 DIAGNOSIS — F411 Generalized anxiety disorder: Secondary | ICD-10-CM | POA: Diagnosis not present

## 2023-03-26 DIAGNOSIS — R462 Strange and inexplicable behavior: Secondary | ICD-10-CM | POA: Diagnosis not present

## 2023-03-26 DIAGNOSIS — E559 Vitamin D deficiency, unspecified: Secondary | ICD-10-CM | POA: Diagnosis not present

## 2023-03-28 DIAGNOSIS — R2681 Unsteadiness on feet: Secondary | ICD-10-CM | POA: Diagnosis not present

## 2023-03-28 DIAGNOSIS — M6281 Muscle weakness (generalized): Secondary | ICD-10-CM | POA: Diagnosis not present

## 2023-04-02 DIAGNOSIS — E039 Hypothyroidism, unspecified: Secondary | ICD-10-CM | POA: Diagnosis not present

## 2023-04-02 DIAGNOSIS — E785 Hyperlipidemia, unspecified: Secondary | ICD-10-CM | POA: Diagnosis not present

## 2023-04-02 DIAGNOSIS — D519 Vitamin B12 deficiency anemia, unspecified: Secondary | ICD-10-CM | POA: Diagnosis not present

## 2023-04-02 DIAGNOSIS — F01B4 Vascular dementia, moderate, with anxiety: Secondary | ICD-10-CM | POA: Diagnosis not present

## 2023-04-02 DIAGNOSIS — E559 Vitamin D deficiency, unspecified: Secondary | ICD-10-CM | POA: Diagnosis not present

## 2023-04-02 DIAGNOSIS — F411 Generalized anxiety disorder: Secondary | ICD-10-CM | POA: Diagnosis not present

## 2023-04-02 DIAGNOSIS — G4700 Insomnia, unspecified: Secondary | ICD-10-CM | POA: Diagnosis not present

## 2023-04-03 DIAGNOSIS — R2681 Unsteadiness on feet: Secondary | ICD-10-CM | POA: Diagnosis not present

## 2023-04-03 DIAGNOSIS — M6281 Muscle weakness (generalized): Secondary | ICD-10-CM | POA: Diagnosis not present

## 2023-04-07 DIAGNOSIS — M6281 Muscle weakness (generalized): Secondary | ICD-10-CM | POA: Diagnosis not present

## 2023-04-07 DIAGNOSIS — R2681 Unsteadiness on feet: Secondary | ICD-10-CM | POA: Diagnosis not present

## 2023-04-09 ENCOUNTER — Telehealth: Payer: Self-pay | Admitting: Internal Medicine

## 2023-04-09 DIAGNOSIS — N390 Urinary tract infection, site not specified: Secondary | ICD-10-CM | POA: Diagnosis not present

## 2023-04-09 DIAGNOSIS — F01B4 Vascular dementia, moderate, with anxiety: Secondary | ICD-10-CM | POA: Diagnosis not present

## 2023-04-09 DIAGNOSIS — R462 Strange and inexplicable behavior: Secondary | ICD-10-CM | POA: Diagnosis not present

## 2023-04-09 DIAGNOSIS — G4700 Insomnia, unspecified: Secondary | ICD-10-CM | POA: Diagnosis not present

## 2023-04-09 DIAGNOSIS — F411 Generalized anxiety disorder: Secondary | ICD-10-CM | POA: Diagnosis not present

## 2023-04-09 NOTE — Telephone Encounter (Signed)
Copied from CRM [id number]. Topic: Medicare AWV >> Apr 09, 2023  1:30 PM Payton Doughty wrote: Reason for CRM: Called 04/09/2023 to sched AWV - NO VOICEMAIL  Verlee Rossetti; Care Guide Ambulatory Clinical Support [place] [facility] Direct Dial: [phone]

## 2023-04-10 DIAGNOSIS — N390 Urinary tract infection, site not specified: Secondary | ICD-10-CM | POA: Diagnosis not present

## 2023-04-10 DIAGNOSIS — R462 Strange and inexplicable behavior: Secondary | ICD-10-CM | POA: Diagnosis not present

## 2023-04-10 DIAGNOSIS — R4182 Altered mental status, unspecified: Secondary | ICD-10-CM | POA: Diagnosis not present

## 2023-04-11 DIAGNOSIS — R2681 Unsteadiness on feet: Secondary | ICD-10-CM | POA: Diagnosis not present

## 2023-04-11 DIAGNOSIS — M6281 Muscle weakness (generalized): Secondary | ICD-10-CM | POA: Diagnosis not present

## 2023-04-14 DIAGNOSIS — R2681 Unsteadiness on feet: Secondary | ICD-10-CM | POA: Diagnosis not present

## 2023-04-14 DIAGNOSIS — M6281 Muscle weakness (generalized): Secondary | ICD-10-CM | POA: Diagnosis not present

## 2023-04-17 DIAGNOSIS — N390 Urinary tract infection, site not specified: Secondary | ICD-10-CM | POA: Diagnosis not present

## 2023-04-17 DIAGNOSIS — M545 Low back pain, unspecified: Secondary | ICD-10-CM | POA: Diagnosis not present

## 2023-04-17 DIAGNOSIS — R2681 Unsteadiness on feet: Secondary | ICD-10-CM | POA: Diagnosis not present

## 2023-04-17 DIAGNOSIS — M6281 Muscle weakness (generalized): Secondary | ICD-10-CM | POA: Diagnosis not present

## 2023-04-21 DIAGNOSIS — M6281 Muscle weakness (generalized): Secondary | ICD-10-CM | POA: Diagnosis not present

## 2023-04-21 DIAGNOSIS — R2681 Unsteadiness on feet: Secondary | ICD-10-CM | POA: Diagnosis not present

## 2023-04-22 DIAGNOSIS — R2681 Unsteadiness on feet: Secondary | ICD-10-CM | POA: Diagnosis not present

## 2023-04-22 DIAGNOSIS — M6281 Muscle weakness (generalized): Secondary | ICD-10-CM | POA: Diagnosis not present

## 2023-04-23 DIAGNOSIS — M6281 Muscle weakness (generalized): Secondary | ICD-10-CM | POA: Diagnosis not present

## 2023-04-23 DIAGNOSIS — R2681 Unsteadiness on feet: Secondary | ICD-10-CM | POA: Diagnosis not present

## 2023-04-24 DIAGNOSIS — N390 Urinary tract infection, site not specified: Secondary | ICD-10-CM | POA: Diagnosis not present

## 2023-04-24 DIAGNOSIS — G4700 Insomnia, unspecified: Secondary | ICD-10-CM | POA: Diagnosis not present

## 2023-04-24 DIAGNOSIS — F411 Generalized anxiety disorder: Secondary | ICD-10-CM | POA: Diagnosis not present

## 2023-04-24 DIAGNOSIS — F01B4 Vascular dementia, moderate, with anxiety: Secondary | ICD-10-CM | POA: Diagnosis not present

## 2023-05-01 DIAGNOSIS — E039 Hypothyroidism, unspecified: Secondary | ICD-10-CM | POA: Diagnosis not present

## 2023-05-01 DIAGNOSIS — E785 Hyperlipidemia, unspecified: Secondary | ICD-10-CM | POA: Diagnosis not present

## 2023-05-01 DIAGNOSIS — G4700 Insomnia, unspecified: Secondary | ICD-10-CM | POA: Diagnosis not present

## 2023-05-01 DIAGNOSIS — F01B4 Vascular dementia, moderate, with anxiety: Secondary | ICD-10-CM | POA: Diagnosis not present

## 2023-05-01 DIAGNOSIS — D519 Vitamin B12 deficiency anemia, unspecified: Secondary | ICD-10-CM | POA: Diagnosis not present

## 2023-05-01 DIAGNOSIS — F411 Generalized anxiety disorder: Secondary | ICD-10-CM | POA: Diagnosis not present

## 2023-05-01 DIAGNOSIS — E559 Vitamin D deficiency, unspecified: Secondary | ICD-10-CM | POA: Diagnosis not present

## 2023-05-01 DIAGNOSIS — N390 Urinary tract infection, site not specified: Secondary | ICD-10-CM | POA: Diagnosis not present

## 2023-05-01 DIAGNOSIS — R300 Dysuria: Secondary | ICD-10-CM | POA: Diagnosis not present

## 2023-05-07 DIAGNOSIS — Z85820 Personal history of malignant melanoma of skin: Secondary | ICD-10-CM | POA: Diagnosis not present

## 2023-05-07 DIAGNOSIS — Z08 Encounter for follow-up examination after completed treatment for malignant neoplasm: Secondary | ICD-10-CM | POA: Diagnosis not present

## 2023-05-07 DIAGNOSIS — L821 Other seborrheic keratosis: Secondary | ICD-10-CM | POA: Diagnosis not present

## 2023-05-14 DIAGNOSIS — F01518 Vascular dementia, unspecified severity, with other behavioral disturbance: Secondary | ICD-10-CM | POA: Diagnosis not present

## 2023-05-14 DIAGNOSIS — E7800 Pure hypercholesterolemia, unspecified: Secondary | ICD-10-CM | POA: Diagnosis not present

## 2023-05-14 DIAGNOSIS — F411 Generalized anxiety disorder: Secondary | ICD-10-CM | POA: Diagnosis not present

## 2023-05-14 DIAGNOSIS — D519 Vitamin B12 deficiency anemia, unspecified: Secondary | ICD-10-CM | POA: Diagnosis not present

## 2023-05-14 DIAGNOSIS — G4700 Insomnia, unspecified: Secondary | ICD-10-CM | POA: Diagnosis not present

## 2023-05-14 DIAGNOSIS — F3341 Major depressive disorder, recurrent, in partial remission: Secondary | ICD-10-CM | POA: Diagnosis not present

## 2023-05-14 DIAGNOSIS — E559 Vitamin D deficiency, unspecified: Secondary | ICD-10-CM | POA: Diagnosis not present

## 2023-05-14 DIAGNOSIS — E039 Hypothyroidism, unspecified: Secondary | ICD-10-CM | POA: Diagnosis not present

## 2023-11-15 ENCOUNTER — Emergency Department

## 2023-11-15 ENCOUNTER — Inpatient Hospital Stay

## 2023-11-15 ENCOUNTER — Other Ambulatory Visit: Payer: Self-pay

## 2023-11-15 ENCOUNTER — Inpatient Hospital Stay: Admitting: Anesthesiology

## 2023-11-15 ENCOUNTER — Encounter
Admission: EM | Disposition: A | Discharge status: Skilled Nursing Facility | Payer: Self-pay | Source: Skilled Nursing Facility | Attending: Internal Medicine

## 2023-11-15 ENCOUNTER — Inpatient Hospital Stay
Admission: EM | Admit: 2023-11-15 | Discharge: 2023-11-20 | DRG: 522 | Disposition: A | Discharge status: Skilled Nursing Facility | Source: Skilled Nursing Facility | Attending: Internal Medicine | Admitting: Internal Medicine

## 2023-11-15 DIAGNOSIS — Z85820 Personal history of malignant melanoma of skin: Secondary | ICD-10-CM | POA: Diagnosis not present

## 2023-11-15 DIAGNOSIS — S62102S Fracture of unspecified carpal bone, left wrist, sequela: Secondary | ICD-10-CM | POA: Diagnosis not present

## 2023-11-15 DIAGNOSIS — Y92091 Bathroom in other non-institutional residence as the place of occurrence of the external cause: Secondary | ICD-10-CM | POA: Diagnosis not present

## 2023-11-15 DIAGNOSIS — E440 Moderate protein-calorie malnutrition: Secondary | ICD-10-CM | POA: Diagnosis present

## 2023-11-15 DIAGNOSIS — E876 Hypokalemia: Secondary | ICD-10-CM | POA: Diagnosis present

## 2023-11-15 DIAGNOSIS — W19XXXA Unspecified fall, initial encounter: Principal | ICD-10-CM

## 2023-11-15 DIAGNOSIS — Z85828 Personal history of other malignant neoplasm of skin: Secondary | ICD-10-CM

## 2023-11-15 DIAGNOSIS — E038 Other specified hypothyroidism: Secondary | ICD-10-CM | POA: Diagnosis not present

## 2023-11-15 DIAGNOSIS — S72002A Fracture of unspecified part of neck of left femur, initial encounter for closed fracture: Secondary | ICD-10-CM

## 2023-11-15 DIAGNOSIS — S72009A Fracture of unspecified part of neck of unspecified femur, initial encounter for closed fracture: Secondary | ICD-10-CM | POA: Diagnosis present

## 2023-11-15 DIAGNOSIS — Z6821 Body mass index (BMI) 21.0-21.9, adult: Secondary | ICD-10-CM | POA: Diagnosis not present

## 2023-11-15 DIAGNOSIS — S52592A Other fractures of lower end of left radius, initial encounter for closed fracture: Secondary | ICD-10-CM | POA: Diagnosis present

## 2023-11-15 DIAGNOSIS — D62 Acute posthemorrhagic anemia: Secondary | ICD-10-CM | POA: Diagnosis not present

## 2023-11-15 DIAGNOSIS — Z888 Allergy status to other drugs, medicaments and biological substances status: Secondary | ICD-10-CM

## 2023-11-15 DIAGNOSIS — F03918 Unspecified dementia, unspecified severity, with other behavioral disturbance: Secondary | ICD-10-CM | POA: Diagnosis present

## 2023-11-15 DIAGNOSIS — R339 Retention of urine, unspecified: Secondary | ICD-10-CM | POA: Diagnosis not present

## 2023-11-15 DIAGNOSIS — W1830XA Fall on same level, unspecified, initial encounter: Secondary | ICD-10-CM | POA: Diagnosis present

## 2023-11-15 DIAGNOSIS — D696 Thrombocytopenia, unspecified: Secondary | ICD-10-CM | POA: Insufficient documentation

## 2023-11-15 DIAGNOSIS — Z880 Allergy status to penicillin: Secondary | ICD-10-CM | POA: Diagnosis not present

## 2023-11-15 DIAGNOSIS — Z79890 Hormone replacement therapy (postmenopausal): Secondary | ICD-10-CM | POA: Diagnosis not present

## 2023-11-15 DIAGNOSIS — Z885 Allergy status to narcotic agent status: Secondary | ICD-10-CM

## 2023-11-15 DIAGNOSIS — Z8249 Family history of ischemic heart disease and other diseases of the circulatory system: Secondary | ICD-10-CM | POA: Diagnosis not present

## 2023-11-15 DIAGNOSIS — S72012A Unspecified intracapsular fracture of left femur, initial encounter for closed fracture: Principal | ICD-10-CM | POA: Diagnosis present

## 2023-11-15 DIAGNOSIS — Z91013 Allergy to seafood: Secondary | ICD-10-CM | POA: Diagnosis not present

## 2023-11-15 DIAGNOSIS — S72002S Fracture of unspecified part of neck of left femur, sequela: Secondary | ICD-10-CM

## 2023-11-15 DIAGNOSIS — E785 Hyperlipidemia, unspecified: Secondary | ICD-10-CM | POA: Diagnosis present

## 2023-11-15 DIAGNOSIS — Z8262 Family history of osteoporosis: Secondary | ICD-10-CM | POA: Diagnosis not present

## 2023-11-15 DIAGNOSIS — K219 Gastro-esophageal reflux disease without esophagitis: Secondary | ICD-10-CM | POA: Diagnosis present

## 2023-11-15 DIAGNOSIS — R338 Other retention of urine: Secondary | ICD-10-CM | POA: Diagnosis not present

## 2023-11-15 DIAGNOSIS — S62102A Fracture of unspecified carpal bone, left wrist, initial encounter for closed fracture: Secondary | ICD-10-CM | POA: Insufficient documentation

## 2023-11-15 DIAGNOSIS — E039 Hypothyroidism, unspecified: Secondary | ICD-10-CM | POA: Diagnosis present

## 2023-11-15 DIAGNOSIS — E079 Disorder of thyroid, unspecified: Secondary | ICD-10-CM | POA: Diagnosis present

## 2023-11-15 DIAGNOSIS — M25552 Pain in left hip: Secondary | ICD-10-CM | POA: Diagnosis present

## 2023-11-15 DIAGNOSIS — S52692A Other fracture of lower end of left ulna, initial encounter for closed fracture: Secondary | ICD-10-CM | POA: Diagnosis present

## 2023-11-15 HISTORY — PX: CLOSED REDUCTION WRIST FRACTURE: SHX1091

## 2023-11-15 HISTORY — PX: HIP ARTHROPLASTY: SHX981

## 2023-11-15 LAB — COMPREHENSIVE METABOLIC PANEL
ALT: 16 U/L (ref 0–44)
AST: 20 U/L (ref 15–41)
Albumin: 4 g/dL (ref 3.5–5.0)
Alkaline Phosphatase: 60 U/L (ref 38–126)
Anion gap: 8 (ref 5–15)
BUN: 15 mg/dL (ref 8–23)
CO2: 25 mmol/L (ref 22–32)
Calcium: 8.9 mg/dL (ref 8.9–10.3)
Chloride: 105 mmol/L (ref 98–111)
Creatinine, Ser: 0.77 mg/dL (ref 0.44–1.00)
GFR, Estimated: >60 mL/min (ref >60)
Glucose, Bld: 135 mg/dL — ABNORMAL HIGH (ref 70–99)
Potassium: 3.6 mmol/L (ref 3.5–5.1)
Sodium: 138 mmol/L (ref 135–145)
Total Bilirubin: 0.8 mg/dL (ref 0.0–1.2)
Total Protein: 6.8 g/dL (ref 6.5–8.1)

## 2023-11-15 LAB — TYPE AND SCREEN
ABO/RH(D): O POS
Antibody Screen: NEGATIVE

## 2023-11-15 LAB — VITAMIN D 25 HYDROXY (VIT D DEFICIENCY, FRACTURES): Vit D, 25-Hydroxy: 40.59 ng/mL (ref 30–100)

## 2023-11-15 LAB — CBC WITH DIFFERENTIAL/PLATELET
Abs Immature Granulocytes: 0.04 10*3/uL (ref 0.00–0.07)
Basophils Absolute: 0 10*3/uL (ref 0.0–0.1)
Basophils Relative: 0 %
Eosinophils Absolute: 0 10*3/uL (ref 0.0–0.5)
Eosinophils Relative: 1 %
HCT: 38.1 % (ref 36.0–46.0)
Hemoglobin: 12.7 g/dL (ref 12.0–15.0)
Immature Granulocytes: 1 %
Lymphocytes Relative: 18 %
Lymphs Abs: 1.2 10*3/uL (ref 0.7–4.0)
MCH: 31.4 pg (ref 26.0–34.0)
MCHC: 33.3 g/dL (ref 30.0–36.0)
MCV: 94.1 fL (ref 80.0–100.0)
Monocytes Absolute: 0.4 10*3/uL (ref 0.1–1.0)
Monocytes Relative: 6 %
Neutro Abs: 4.9 10*3/uL (ref 1.7–7.7)
Neutrophils Relative %: 74 %
Platelets: 177 10*3/uL (ref 150–400)
RBC: 4.05 MIL/uL (ref 3.87–5.11)
RDW: 12.8 % (ref 11.5–15.5)
WBC: 6.6 10*3/uL (ref 4.0–10.5)
nRBC: 0 % (ref 0.0–0.2)

## 2023-11-15 LAB — CK: Total CK: 58 U/L (ref 38–234)

## 2023-11-15 LAB — PROTIME-INR
INR: 1.1 (ref 0.8–1.2)
Prothrombin Time: 14.4 s (ref 11.4–15.2)

## 2023-11-15 LAB — TROPONIN I (HIGH SENSITIVITY)
Troponin I (High Sensitivity): 6 ng/L (ref <18)
Troponin I (High Sensitivity): 14 ng/L (ref <18)

## 2023-11-15 LAB — TSH: TSH: 1.898 u[IU]/mL (ref 0.350–4.500)

## 2023-11-15 SURGERY — HEMIARTHROPLASTY (BIPOLAR) HIP, POSTERIOR APPROACH FOR FRACTURE
Anesthesia: Spinal | Site: Wrist | Laterality: Left

## 2023-11-15 MED ORDER — METOCLOPRAMIDE HCL 5 MG/ML IJ SOLN: 5.0000 mg | Freq: Three times a day (TID) | INTRAMUSCULAR | Status: DC | PRN

## 2023-11-15 MED ORDER — BUPIVACAINE-EPINEPHRINE 0.25% -1:200000 IJ SOLN
INTRAMUSCULAR | Status: DC | PRN
  Administered 2023-11-15: 15 mL
  Administered 2023-11-15: 30 mL

## 2023-11-15 MED ORDER — OXYCODONE HCL 5 MG/5ML PO SOLN: 5.0000 mg | Freq: Once | ORAL | Status: DC | PRN

## 2023-11-15 MED ORDER — ZOLPIDEM TARTRATE 5 MG PO TABS: 5.0000 mg | ORAL_TABLET | Freq: Every evening | ORAL | Status: DC | PRN

## 2023-11-15 MED ORDER — ACETAMINOPHEN 10 MG/ML IV SOLN: 1000.0000 mg | Freq: Once | INTRAVENOUS | Status: DC | PRN

## 2023-11-15 MED ORDER — ACETAMINOPHEN 325 MG PO TABS: 325.0000 mg | ORAL_TABLET | Freq: Four times a day (QID) | ORAL | Status: DC | PRN

## 2023-11-15 MED ORDER — ENOXAPARIN SODIUM 40 MG/0.4ML IJ SOSY: 40.0000 mg | PREFILLED_SYRINGE | INTRAMUSCULAR | Status: DC

## 2023-11-15 MED ORDER — ONDANSETRON HCL 4 MG/2ML IJ SOLN
INTRAMUSCULAR | Status: DC | PRN
  Administered 2023-11-15: 4 mg via INTRAVENOUS

## 2023-11-15 MED ORDER — HYDROMORPHONE HCL 1 MG/ML IJ SOLN
0.5000 mg | INTRAMUSCULAR | Status: DC | PRN
  Administered 2023-11-15 – 2023-11-16 (×2): 0.5 mg via INTRAVENOUS
  Filled 2023-11-15 (×2): qty 0.5

## 2023-11-15 MED ORDER — ACETAMINOPHEN 10 MG/ML IV SOLN
INTRAVENOUS | Status: DC | PRN
  Administered 2023-11-15: 1000 mg via INTRAVENOUS

## 2023-11-15 MED ORDER — PROPOFOL 500 MG/50ML IV EMUL
INTRAVENOUS | Status: DC | PRN
  Administered 2023-11-15: 25 ug/kg/min via INTRAVENOUS

## 2023-11-15 MED ORDER — DOCUSATE SODIUM 100 MG PO CAPS
100.0000 mg | ORAL_CAPSULE | Freq: Two times a day (BID) | ORAL | Status: DC
  Administered 2023-11-15 – 2023-11-20 (×10): 100 mg via ORAL
  Filled 2023-11-15 (×10): qty 1

## 2023-11-15 MED ORDER — SODIUM CHLORIDE 0.45 % IV SOLN: INTRAVENOUS | Status: AC

## 2023-11-15 MED ORDER — 0.9 % SODIUM CHLORIDE (POUR BTL) OPTIME
TOPICAL | Status: DC | PRN
  Administered 2023-11-15: 500 mL

## 2023-11-15 MED ORDER — FENTANYL CITRATE (PF) 100 MCG/2ML IJ SOLN
INTRAMUSCULAR | Status: AC
  Filled 2023-11-15: qty 2

## 2023-11-15 MED ORDER — MEMANTINE HCL 5 MG PO TABS
5.0000 mg | ORAL_TABLET | Freq: Two times a day (BID) | ORAL | Status: DC
  Administered 2023-11-15 – 2023-11-20 (×10): 5 mg via ORAL
  Filled 2023-11-15 (×10): qty 1

## 2023-11-15 MED ORDER — SENNOSIDES-DOCUSATE SODIUM 8.6-50 MG PO TABS: 1.0000 | ORAL_TABLET | Freq: Every evening | ORAL | Status: DC | PRN

## 2023-11-15 MED ORDER — FERROUS SULFATE 325 (65 FE) MG PO TABS
325.0000 mg | ORAL_TABLET | Freq: Every day | ORAL | Status: DC
  Administered 2023-11-16 – 2023-11-20 (×5): 325 mg via ORAL
  Filled 2023-11-15 (×5): qty 1

## 2023-11-15 MED ORDER — CEFAZOLIN SODIUM-DEXTROSE 2-4 GM/100ML-% IV SOLN
2.0000 g | INTRAVENOUS | Status: AC
Start: 2023-11-16 — End: 2023-11-15
  Administered 2023-11-15: 2 g via INTRAVENOUS

## 2023-11-15 MED ORDER — NEOMYCIN-POLYMYXIN B GU 40-200000 IR SOLN
Status: DC | PRN
  Administered 2023-11-15: 4 mL

## 2023-11-15 MED ORDER — PROPOFOL 10 MG/ML IV BOLUS
INTRAVENOUS | Status: DC | PRN
  Administered 2023-11-15 (×4): 20 mg via INTRAVENOUS

## 2023-11-15 MED ORDER — ENOXAPARIN SODIUM 40 MG/0.4ML IJ SOSY
40.0000 mg | PREFILLED_SYRINGE | INTRAMUSCULAR | Status: DC
  Administered 2023-11-16 – 2023-11-20 (×5): 40 mg via SUBCUTANEOUS
  Filled 2023-11-15 (×5): qty 0.4

## 2023-11-15 MED ORDER — ONDANSETRON HCL 4 MG/2ML IJ SOLN: 4.0000 mg | Freq: Four times a day (QID) | INTRAMUSCULAR | Status: DC | PRN

## 2023-11-15 MED ORDER — CEFAZOLIN SODIUM-DEXTROSE 2-4 GM/100ML-% IV SOLN
2.0000 g | Freq: Three times a day (TID) | INTRAVENOUS | Status: AC
  Administered 2023-11-15 – 2023-11-16 (×3): 2 g via INTRAVENOUS
  Filled 2023-11-15 (×3): qty 100

## 2023-11-15 MED ORDER — SODIUM CHLORIDE (PF) 0.9 % IJ SOLN
INTRAMUSCULAR | Status: DC | PRN
  Administered 2023-11-15: 1000 mL

## 2023-11-15 MED ORDER — POLYETHYLENE GLYCOL 3350 17 G PO PACK
17.0000 g | PACK | Freq: Every day | ORAL | Status: DC | PRN
  Administered 2023-11-17 – 2023-11-18 (×2): 17 g via ORAL
  Filled 2023-11-15 (×2): qty 1

## 2023-11-15 MED ORDER — TRAMADOL HCL 50 MG PO TABS
50.0000 mg | ORAL_TABLET | Freq: Four times a day (QID) | ORAL | Status: DC | PRN
  Administered 2023-11-16 – 2023-11-20 (×4): 50 mg via ORAL
  Filled 2023-11-15 (×4): qty 1

## 2023-11-15 MED ORDER — NITROFURANTOIN MONOHYD MACRO 100 MG PO CAPS
100.0000 mg | ORAL_CAPSULE | Freq: Every day | ORAL | Status: DC
  Administered 2023-11-16 – 2023-11-20 (×5): 100 mg via ORAL
  Filled 2023-11-15 (×5): qty 1

## 2023-11-15 MED ORDER — METOCLOPRAMIDE HCL 5 MG PO TABS: 5.0000 mg | ORAL_TABLET | Freq: Three times a day (TID) | ORAL | Status: DC | PRN

## 2023-11-15 MED ORDER — GABAPENTIN 100 MG PO CAPS
200.0000 mg | ORAL_CAPSULE | Freq: Two times a day (BID) | ORAL | Status: DC
  Administered 2023-11-15 – 2023-11-19 (×9): 200 mg via ORAL
  Filled 2023-11-15 (×9): qty 2

## 2023-11-15 MED ORDER — CHLORHEXIDINE GLUCONATE 4 % EX SOLN: Freq: Once | CUTANEOUS | Status: DC

## 2023-11-15 MED ORDER — SERTRALINE HCL 50 MG PO TABS
100.0000 mg | ORAL_TABLET | Freq: Every day | ORAL | Status: DC
  Administered 2023-11-15 – 2023-11-20 (×6): 100 mg via ORAL
  Filled 2023-11-15 (×6): qty 2

## 2023-11-15 MED ORDER — TRANEXAMIC ACID-NACL 1000-0.7 MG/100ML-% IV SOLN
INTRAVENOUS | Status: AC
  Filled 2023-11-15: qty 100

## 2023-11-15 MED ORDER — LEVOTHYROXINE SODIUM 75 MCG PO TABS
75.0000 ug | ORAL_TABLET | Freq: Every day | ORAL | Status: DC
  Administered 2023-11-16 – 2023-11-20 (×5): 75 ug via ORAL
  Filled 2023-11-15: qty 3
  Filled 2023-11-15: qty 1
  Filled 2023-11-15 (×2): qty 3
  Filled 2023-11-15 (×4): qty 1
  Filled 2023-11-15 (×2): qty 3

## 2023-11-15 MED ORDER — ACETAMINOPHEN 10 MG/ML IV SOLN
INTRAVENOUS | Status: AC
  Filled 2023-11-15: qty 100

## 2023-11-15 MED ORDER — MENTHOL 3 MG MT LOZG: 1.0000 | LOZENGE | OROMUCOSAL | Status: DC | PRN

## 2023-11-15 MED ORDER — PHENYLEPHRINE 80 MCG/ML (10ML) SYRINGE FOR IV PUSH (FOR BLOOD PRESSURE SUPPORT)
PREFILLED_SYRINGE | INTRAVENOUS | Status: DC | PRN
Start: 2023-11-15 — End: 2023-11-15
  Administered 2023-11-15: 160 ug via INTRAVENOUS
  Administered 2023-11-15: 80 ug via INTRAVENOUS

## 2023-11-15 MED ORDER — GLYCOPYRROLATE 0.2 MG/ML IJ SOLN
INTRAMUSCULAR | Status: DC | PRN
Start: 2023-11-15 — End: 2023-11-15
  Administered 2023-11-15: .2 mg via INTRAVENOUS

## 2023-11-15 MED ORDER — METHOCARBAMOL 500 MG PO TABS: 500.0000 mg | ORAL_TABLET | Freq: Four times a day (QID) | ORAL | Status: DC | PRN

## 2023-11-15 MED ORDER — CELECOXIB 200 MG PO CAPS
200.0000 mg | ORAL_CAPSULE | Freq: Two times a day (BID) | ORAL | Status: DC
  Filled 2023-11-15: qty 1

## 2023-11-15 MED ORDER — BUPIVACAINE HCL (PF) 0.5 % IJ SOLN
INTRAMUSCULAR | Status: DC | PRN
  Administered 2023-11-15: 2.5 mL

## 2023-11-15 MED ORDER — FLEET ENEMA RE ENEM: 1.0000 | ENEMA | Freq: Once | RECTAL | Status: DC | PRN

## 2023-11-15 MED ORDER — PHENYLEPHRINE HCL-NACL 20-0.9 MG/250ML-% IV SOLN
INTRAVENOUS | Status: AC
  Filled 2023-11-15: qty 250

## 2023-11-15 MED ORDER — FENTANYL CITRATE PF 50 MCG/ML IJ SOSY
75.0000 ug | PREFILLED_SYRINGE | Freq: Once | INTRAMUSCULAR | Status: AC
  Administered 2023-11-15: 75 ug via INTRAVENOUS
  Filled 2023-11-15: qty 2

## 2023-11-15 MED ORDER — BISACODYL 10 MG RE SUPP
10.0000 mg | Freq: Every day | RECTAL | Status: DC | PRN
  Administered 2023-11-18: 10 mg via RECTAL
  Filled 2023-11-15: qty 1

## 2023-11-15 MED ORDER — PHENOL 1.4 % MT LIQD: 1.0000 | OROMUCOSAL | Status: DC | PRN

## 2023-11-15 MED ORDER — FENTANYL CITRATE (PF) 100 MCG/2ML IJ SOLN
INTRAMUSCULAR | Status: DC | PRN
  Administered 2023-11-15 (×2): 50 ug via INTRAVENOUS

## 2023-11-15 MED ORDER — CEFAZOLIN SODIUM-DEXTROSE 2-4 GM/100ML-% IV SOLN
INTRAVENOUS | Status: AC
  Filled 2023-11-15: qty 100

## 2023-11-15 MED ORDER — METHOCARBAMOL 1000 MG/10ML IJ SOLN: 500.0000 mg | Freq: Four times a day (QID) | INTRAMUSCULAR | Status: DC | PRN

## 2023-11-15 MED ORDER — FENTANYL CITRATE PF 50 MCG/ML IJ SOSY
50.0000 ug | PREFILLED_SYRINGE | Freq: Once | INTRAMUSCULAR | Status: AC
  Administered 2023-11-15: 50 ug via INTRAVENOUS
  Filled 2023-11-15: qty 1

## 2023-11-15 MED ORDER — LORAZEPAM 0.5 MG PO TABS
0.5000 mg | ORAL_TABLET | Freq: Every day | ORAL | Status: DC
  Administered 2023-11-15 – 2023-11-20 (×6): 0.5 mg via ORAL
  Filled 2023-11-15 (×6): qty 1

## 2023-11-15 MED ORDER — TRANEXAMIC ACID-NACL 1000-0.7 MG/100ML-% IV SOLN
1000.0000 mg | INTRAVENOUS | Status: AC
  Administered 2023-11-15: 1000 mg via INTRAVENOUS
  Filled 2023-11-15: qty 100

## 2023-11-15 MED ORDER — HYDROCODONE-ACETAMINOPHEN 5-325 MG PO TABS: 1.0000 | ORAL_TABLET | Freq: Four times a day (QID) | ORAL | Status: DC | PRN

## 2023-11-15 MED ORDER — OXYCODONE HCL 5 MG PO TABS: 5.0000 mg | ORAL_TABLET | Freq: Once | ORAL | Status: DC | PRN

## 2023-11-15 MED ORDER — PHENYLEPHRINE HCL-NACL 20-0.9 MG/250ML-% IV SOLN
INTRAVENOUS | Status: DC | PRN
  Administered 2023-11-15: 30 ug/min via INTRAVENOUS

## 2023-11-15 MED ORDER — CELECOXIB 200 MG PO CAPS
200.0000 mg | ORAL_CAPSULE | Freq: Every day | ORAL | Status: AC
  Administered 2023-11-15: 200 mg via ORAL
  Filled 2023-11-15: qty 1

## 2023-11-15 MED ORDER — TRANEXAMIC ACID-NACL 1000-0.7 MG/100ML-% IV SOLN
1000.0000 mg | Freq: Once | INTRAVENOUS | Status: AC
  Administered 2023-11-15: 1000 mg via INTRAVENOUS

## 2023-11-15 MED ORDER — MORPHINE SULFATE (PF) 2 MG/ML IV SOLN: 0.5000 mg | INTRAVENOUS | Status: DC | PRN

## 2023-11-15 MED ORDER — LACTATED RINGERS IV SOLN: INTRAVENOUS | Status: DC | PRN

## 2023-11-15 MED ORDER — ALUM & MAG HYDROXIDE-SIMETH 200-200-20 MG/5ML PO SUSP: 30.0000 mL | ORAL | Status: DC | PRN

## 2023-11-15 MED ORDER — ONDANSETRON HCL 4 MG/2ML IJ SOLN
4.0000 mg | Freq: Once | INTRAMUSCULAR | Status: AC
  Administered 2023-11-15: 4 mg via INTRAVENOUS
  Filled 2023-11-15: qty 2

## 2023-11-15 MED ORDER — BUPIVACAINE-EPINEPHRINE (PF) 0.25% -1:200000 IJ SOLN
INTRAMUSCULAR | Status: AC
  Filled 2023-11-15: qty 30

## 2023-11-15 MED ORDER — SODIUM CHLORIDE 0.9 % IV SOLN: INTRAVENOUS | Status: DC

## 2023-11-15 SURGICAL SUPPLY — 64 items
BLADE SAGITTAL WIDE XTHICK NO (BLADE) ×2 IMPLANT
BLADE SURG MINI STRL (BLADE) ×2 IMPLANT
BNDG COHESIVE 4X5 TAN STRL LF (GAUZE/BANDAGES/DRESSINGS) IMPLANT
BNDG ESMARCH 4X12 STRL LF (GAUZE/BANDAGES/DRESSINGS) ×2 IMPLANT
CHLORAPREP W/TINT 26 (MISCELLANEOUS) ×6 IMPLANT
CUFF TOURN SGL QUICK 18X4 (TOURNIQUET CUFF) IMPLANT
DRAPE FLUOR MINI C-ARM 54X84 (DRAPES) ×2 IMPLANT
DRAPE INCISE IOBAN 66X60 STRL (DRAPES) ×4 IMPLANT
DRAPE TABLE BACK 80X90 (DRAPES) ×2 IMPLANT
DRSG GAUZE FLUFF 36X18 (GAUZE/BANDAGES/DRESSINGS) ×2 IMPLANT
ELECT CAUTERY BLADE 6.4 (BLADE) ×2 IMPLANT
ELECT REM PT RETURN 9FT ADLT (ELECTROSURGICAL) ×2 IMPLANT
ELECTRODE REM PT RTRN 9FT ADLT (ELECTROSURGICAL) ×4 IMPLANT
GAUZE 4X4 16PLY ~~LOC~~+RFID DBL (SPONGE) ×2 IMPLANT
GAUZE SPONGE 4X4 12PLY STRL (GAUZE/BANDAGES/DRESSINGS) ×6 IMPLANT
GAUZE XEROFORM 1X8 LF (GAUZE/BANDAGES/DRESSINGS) ×6 IMPLANT
GLOVE INDICATOR 8.0 STRL GRN (GLOVE) ×4 IMPLANT
GLOVE SURG ORTHO 8.5 STRL (GLOVE) ×4 IMPLANT
GOWN SRG LRG LVL 4 IMPRV REINF (GOWNS) IMPLANT
GOWN SRG XL LVL 3 NONREINFORCE (GOWNS) IMPLANT
GOWN STRL REUS W/ TWL LRG LVL3 (GOWN DISPOSABLE) ×6 IMPLANT
GOWN STRL REUS W/TWL LRG LVL4 (GOWN DISPOSABLE) ×4 IMPLANT
HEAD UNPLR 47XMDLR STRL HIP (Orthopedic Implant) IMPLANT
HOLSTER ELECTROSUGICAL PENCIL (MISCELLANEOUS) ×2 IMPLANT
IV NS 1000ML BAXH (IV SOLUTION) ×2 IMPLANT
KIT TURNOVER KIT A (KITS) ×4 IMPLANT
MANIFOLD NEPTUNE II (INSTRUMENTS) ×4 IMPLANT
NDL FILTER BLUNT 18X1 1/2 (NEEDLE) ×2 IMPLANT
NDL FRENCH SPRG EYE 1/2 CRC (NEEDLE) IMPLANT
NDL SAFETY ECLIPSE 18X1.5 (NEEDLE) ×2 IMPLANT
NDL SPNL 18GX3.5 QUINCKE PK (NEEDLE) ×4 IMPLANT
NEEDLE FILTER BLUNT 18X1 1/2 (NEEDLE) ×2 IMPLANT
NEEDLE SPNL 18GX3.5 QUINCKE PK (NEEDLE) ×4 IMPLANT
NS IRRIG 1000ML POUR BTL (IV SOLUTION) ×2 IMPLANT
NS IRRIG 500ML POUR BTL (IV SOLUTION) ×2 IMPLANT
PACK EXTREMITY ARMC (MISCELLANEOUS) ×2 IMPLANT
PACK HIP PROSTHESIS (MISCELLANEOUS) ×2 IMPLANT
PAD ABD DERMACEA PRESS 5X9 (GAUZE/BANDAGES/DRESSINGS) IMPLANT
PADDING CAST BLEND 4X4 STRL (MISCELLANEOUS) ×4 IMPLANT
PULSAVAC PLUS IRRIG FAN TIP (DISPOSABLE) ×2 IMPLANT
SLEEVE UNITRAX V40 +4 (Orthopedic Implant) IMPLANT
SOL PREP PVP 2OZ (MISCELLANEOUS) ×2 IMPLANT
SOLUTION PREP PVP 2OZ (MISCELLANEOUS) ×2 IMPLANT
SPLINT CAST 1 STEP 3X12 (MISCELLANEOUS) ×2 IMPLANT
SPLINT CAST 1 STEP 4X30 (MISCELLANEOUS) IMPLANT
SPONGE T-LAP 18X18 ~~LOC~~+RFID (SPONGE) ×10 IMPLANT
STAPLER SKIN PROX 35W (STAPLE) ×4 IMPLANT
STEM FEM ACCOLADE 38X102X30 S3 (Stem) IMPLANT
STOCKINETTE 48X4 2 PLY STRL (GAUZE/BANDAGES/DRESSINGS) ×2 IMPLANT
STOCKINETTE BIAS CUT 4 980044 (GAUZE/BANDAGES/DRESSINGS) ×2 IMPLANT
STOCKINETTE STRL 4IN 9604848 (GAUZE/BANDAGES/DRESSINGS) IMPLANT
SUT QUILL PDO 2-0 40CM 26MM (SUTURE) IMPLANT
SUT STRATAFIX 14 PDO 36 VLT (SUTURE) IMPLANT
SUT STRATAFIX PDO 1 14 VIOLET (SUTURE) ×2 IMPLANT
SUT TICRON 2-0 30IN 311381 (SUTURE) ×8 IMPLANT
SYR 10ML LL (SYRINGE) ×2 IMPLANT
SYR 30ML LL (SYRINGE) ×2 IMPLANT
SYR 50ML LL SCALE MARK (SYRINGE) ×2 IMPLANT
TAPE MICROFOAM 4IN (TAPE) ×2 IMPLANT
TIP FAN IRRIG PULSAVAC PLUS (DISPOSABLE) ×2 IMPLANT
TRAP FLUID SMOKE EVACUATOR (MISCELLANEOUS) ×2 IMPLANT
TUBE SUCT KAM VAC (TUBING) ×2 IMPLANT
WATER STERILE IRR 1000ML POUR (IV SOLUTION) ×2 IMPLANT
WATER STERILE IRR 500ML POUR (IV SOLUTION) ×2 IMPLANT

## 2023-11-15 NOTE — Op Note (Signed)
 11/15/2023  5:27 PM  PATIENT:  Samantha Hanna   MRN: [id number]  PRE-OPERATIVE DIAGNOSIS:  Displaced Subcapital fracture left hip   POST-OPERATIVE DIAGNOSIS: Same  PROCEDURE: Left   hip hemiarthroplasty with Stryker Accolade prosthesis  PREOPERATIVE INDICATIONS:  Samantha Hanna is an 83 y.o. female who was admitted 11/15/2023 with a diagnosis of displaced subcapital fracture of the hip and elected for surgical management.  The risks benefits and alternatives were discussed with the patient including but not limited to the risks of nonoperative treatment, versus surgical intervention including infection, bleeding, nerve injury, periprosthetic fracture, the need for revision surgery, dislocation, leg length discrepancy, blood clots, cardiopulmonary complications, morbidity, mortality, among others, and they were willing to proceed.  Predicted outcome is good, although there will be at least a six to nine month expected recovery.     SURGEON:  Deeann Saint, MD  ASST:    ANESTHESIA: Spinal with sedation    COMPLICATIONS:  None.   EBL: 100 cc    COMPONENTS:  Stryker Accolade Femoral Fracture stem size # 3,   and a size   47 mm  fracture head unipolar hip ball with    +4 mm  neck length.    PROCEDURE IN DETAIL: The patient was met in the holding area and identified.  The appropriate hip  was marked at the operative site. The patient was then transported to the OR and  placed under general anesthesia.  At that point, the patient was  placed in the lateral decubitus position with the operative side up and  secured to the operating room table and all bony prominences padded.     The operative lower extremity was prepped from the iliac crest to the toes.  Sterile draping was performed.  Time out was performed prior to incision.      A routine posterolateral approach was utilized via sharp dissection  carried down to the subcutaneous tissue.  Gross bleeders were Bovie  coagulated.  The iliotibial band  was identified and incised  along the length of the skin incision.  Self-retaining retractors were  inserted.  With the hip internally rotated, the short external rotators  were identified. The piriformis was tagged and the hip capsule released in a T-type fashion.  The femoral neck was exposed, and I resected the femoral neck using the appropriate jig. This was performed at approximately a thumb's breadth above the lesser trochanter.    I then exposed the deep acetabulum, cleared out any tissue including the ligamentum teres.    I then prepared the proximal femur using the cookie-cutter, the lateralizing reamer, and then sequentially broached.  A trial stem   was  utilized along with a unipolar head and neck.  I reduced the hip and it was found to have excellent stability with functional range of motion. Leg lengths were equal.  The trial components were then removed.   The same size Accolade femoral stem was then inserted and was very stable.  The Unitrax head and neck as trialed were inserted as well.     The hip was then reduced and taken through functional range of motion and found to have excellent stability. Leg lengths were restored.     I closed the T in the capsule with #2 Ticron as well as the short external rotators. A hemovac was inserted.    I then irrigated the hip copiously again with pulse lavage, and repaired the fascia with #2 Quill and the subcutaneous layer with #  0 Quill. Sponge and needle counts were correct. Dry sterile Aquacell was applied.   The patient was then awakened and returned to PACU in stable and satisfactory condition. There were no complications.  Valinda Hoar, MD Orthopedic Surgeon [phone]   11/15/2023 5:27 PM

## 2023-11-15 NOTE — ED Provider Notes (Signed)
 [facility] Provider Note    Event Date/Time   First MD Initiated Contact with Patient 11/15/23 [phone]     (approximate)   History   No chief complaint on file.   HPI  Samantha Hanna is a 83 y.o. female who comes in from [facility] assisted living for unwitnessed fall in the bathroom.  Does not know how long she was on the ground for.  Patient having left hip and wrist pain.  Possibility of her hitting her head.  Patient unsure why she fell.  Patient does appear confused but has baseline known dementia.   Physical Exam   Triage Vital Signs: ED Triage Vitals  Encounter Vitals Group     BP      Systolic BP Percentile      Diastolic BP Percentile      Pulse      Resp      Temp      Temp src      SpO2      Weight      Height      Head Circumference      Peak Flow      Pain Score      Pain Loc      Pain Education      Exclude from Growth Chart     Most recent vital signs: Vitals:   11/15/23 0827  BP: (!) 159/82  Pulse: 68  Resp: 18  Temp: 98.3 F (36.8 C)  SpO2: 100%     General: Awake, no distress.  CV:  Good peripheral perfusion.  Resp:  Normal effort.  Abd:  No distention.  Other:  Tenderness to the left knee and tenderness to the left hip.  Tenderness to the left wrist with deformity noted no open abrasion- able to wiggle fingers  Good distal pulses.  No tenderness on the right side.  No facial tenderness.  No obvious back tenderness   ED Results / Procedures / Treatments   Labs (all labs ordered are listed, but only abnormal results are displayed) Labs Reviewed  CBC WITH DIFFERENTIAL/PLATELET  COMPREHENSIVE METABOLIC PANEL  CK  TROPONIN I (HIGH SENSITIVITY)     EKG  My interpretation of EKG:  Sinus rate of 67 without any ST elevation or T wave inversions, normal intervals  RADIOLOGY I have reviewed the xray personally and interpreted + wrist and + hip fracture    PROCEDURES:  Critical Care performed:  No  Procedures   MEDICATIONS ORDERED IN ED: Medications  fentaNYL (SUBLIMAZE) injection 50 mcg (50 mcg Intravenous Given 11/15/23 0840)  ondansetron (ZOFRAN) injection 4 mg (4 mg Intravenous Given 11/15/23 0839)     IMPRESSION / MDM / ASSESSMENT AND PLAN / ED COURSE  I reviewed the triage vital signs and the nursing notes.   Patient's presentation is most consistent with acute presentation with potential threat to life or bodily function.   Differential is intracranial hemorrhage, cervical fracture, wrist fracture, hip fracture. Patient given IV fentanyl IV Zofran help with symptoms  CK is normal CBC reassuring CMP reassuring troponin negative  X-rays confirm femur fracture, wrist fracture.  Patient neurovascularly intact although does have pain.  No injury of the head or neck.  Family at bedside was updated.  Discussed the case with Dr. Hyacinth Meeker, ortho.  Will plan for OR.  Given patient be going to the OR for her hip I did discuss with Dr. Hyacinth Meeker I would not reduce the wrist at bedside  I will however place a splint to help protect the wrist given I am not clear when she will be going to the OR.   I discussed with the hospital team for admission    FINAL CLINICAL IMPRESSION(S) / ED DIAGNOSES   Final diagnoses:  Fall, initial encounter  Closed fracture of left wrist, initial encounter  Closed fracture of left hip, initial encounter [facility])     Rx / DC Orders   ED Discharge Orders     None        Note:  This document was prepared using Dragon voice recognition software and may include unintentional dictation errors.   Concha Se, MD 11/15/23 [phone]

## 2023-11-15 NOTE — ED Notes (Signed)
 Sugar tong splint applied to pt's left forearm.

## 2023-11-15 NOTE — ED Notes (Signed)
 Pt transported to unit by float RN

## 2023-11-15 NOTE — Anesthesia Preprocedure Evaluation (Addendum)
 Anesthesia Evaluation  Patient identified by MRN, date of birth, ID band Patient confused  General Assessment Comment:Only alert to birthdate  Reviewed: Allergy & Precautions, H&P , NPO status , Patient's Chart, lab work & pertinent test results  Airway Mallampati: II  TM Distance: >3 FB Neck ROM: full    Dental  (+) Chipped   Pulmonary neg pulmonary ROS   Pulmonary exam normal        Cardiovascular negative cardio ROS Normal cardiovascular exam     Neuro/Psych  PSYCHIATRIC DISORDERS Anxiety    Dementia negative neurological ROS     GI/Hepatic Neg liver ROS,GERD  Controlled,,  Endo/Other  negative endocrine ROSHypothyroidism    Renal/GU      Musculoskeletal   Abdominal   Peds  Hematology negative hematology ROS (+)   Anesthesia Other Findings Past Medical History: No date: Arthritis No date: Cancer (HCC)     Comment:  SKIN CANCER-BASAL CELL No date: GERD (gastroesophageal reflux disease) No date: Hyperlipidemia No date: Hypothyroidism No date: Thyroid disease  Past Surgical History: No date: APPENDECTOMY 09/10/2017: CARPOMETACARPAL (CMC) FUSION OF THUMB; Left     Comment:  Procedure: CARPOMETACARPAL (CMC) FUSION OF THUMB;                Surgeon: Deeann Saint, MD;  Location: [facility] ORS;                Service: Orthopedics;  Laterality: Left; No date: MELANOMA EXCISION     Comment:  x 2, Dr. Jarold Motto     Reproductive/Obstetrics negative OB ROS                             Anesthesia Physical Anesthesia Plan  ASA: 2  Anesthesia Plan: Spinal and General   Post-op Pain Management: Ofirmev IV (intra-op)* and Toradol IV (intra-op)*   Induction: Intravenous  PONV Risk Score and Plan: Propofol infusion  Airway Management Planned:   Additional Equipment:   Intra-op Plan:   Post-operative Plan:   Informed Consent: I have reviewed the patients History and Physical, chart,  labs and discussed the procedure including the risks, benefits and alternatives for the proposed anesthesia with the patient or authorized representative who has indicated his/her understanding and acceptance.     Dental Advisory Given and Consent reviewed with POA  Plan Discussed with: CRNA and Surgeon  Anesthesia Plan Comments:         Anesthesia Quick Evaluation

## 2023-11-15 NOTE — Consult Note (Signed)
 ORTHOPAEDIC CONSULTATION  REQUESTING PHYSICIAN: Emeline General, MD  Chief Complaint: Left hip and wrist pain  HPI: Samantha Hanna is a 83 y.o. female who complains of left wrist and hip pain following a fall at the dementia unit at [facility] this morning.  She was brought to the emergency room where exam and x-rays revealed a displaced subcapital fracture of the left hip and a displaced left distal radius fracture with mild comminution which does not appear to be intra-articular.  I have discussed the patient's problems with her daughters Clydie Braun and Jacki Cones and her brother Kateri Mc.  I have recommended left hip hemiarthroplasty and closed, possible open reduction of the left wrist today.  She has been cleared medically for surgery.  The risk and benefits fits of these procedures were discussed with him at length.  They all wish to proceed.  Past Medical History:  Diagnosis Date   Arthritis    Cancer (HCC)    SKIN CANCER-BASAL CELL   GERD (gastroesophageal reflux disease)    Hyperlipidemia    Hypothyroidism    Thyroid disease    Past Surgical History:  Procedure Laterality Date   APPENDECTOMY     CARPOMETACARPAL (CMC) FUSION OF THUMB Left 09/10/2017   Procedure: CARPOMETACARPAL (CMC) FUSION OF THUMB;  Surgeon: Deeann Saint, MD;  Location: [facility] ORS;  Service: Orthopedics;  Laterality: Left;   MELANOMA EXCISION     x 2, Dr. Jarold Motto   Social History   Socioeconomic History   Marital status: Married    Spouse name: Not on file   Number of children: Not on file   Years of education: Not on file   Highest education level: Not on file  Occupational History   Not on file  Tobacco Use   Smoking status: Never   Smokeless tobacco: Never  Vaping Use   Vaping status: Never Used  Substance and Sexual Activity   Alcohol use: Yes    Alcohol/week: 3.0 standard drinks of alcohol    Types: 3 Standard drinks or equivalent per week    Comment: social   Drug use: No   Sexual activity: Yes   Other Topics Concern   Not on file  Social History Narrative   She lives with husband (optometrist) who has Parkinson's disease.  They have two grown children.   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    Highest level of education:  Energy manager   Social Drivers of Health   Financial Resource Strain: Low Risk  (02/05/2022)   Overall Financial Resource Strain (CARDIA)    Difficulty of Paying Living Expenses: Not hard at all  Food Insecurity: No Food Insecurity (02/05/2022)   Hunger Vital Sign    Worried About Running Out of Food in the Last Year: Never true    Ran Out of Food in the Last Year: Never true  Transportation Needs: No Transportation Needs (02/05/2022)   PRAPARE - Administrator, Civil Service (Medical): No    Lack of Transportation (Non-Medical): No  Physical Activity: Sufficiently Active (02/05/2022)   Exercise Vital Sign    Days of Exercise per Week: 5 days    Minutes of Exercise per Session: 30 min  Stress: No Stress Concern Present (02/05/2022)   [employer] of Occupational Health - Occupational Stress Questionnaire    Feeling of Stress : Not at all  Social Connections: Unknown (02/05/2022)   Social Connection and Isolation Panel [NHANES]    Frequency  of Communication with Friends and Family: More than three times a week    Frequency of Social Gatherings with Friends and Family: More than three times a week    Attends Religious Services: Not on Marketing executive or Organizations: Yes    Attends Banker Meetings: Not on file    Marital Status: Married   Family History  Problem Relation Age of Onset   Osteoporosis Mother        Deceased, 65   Coronary artery disease Father 95       Deceased, 57 in MVA   Heart Problems Father    Arthritis Daughter 50       Rheumatiod   Healthy Daughter    Allergies  Allergen Reactions   Hydrocodone     Hallucination   Shellfish Allergy Itching    Statins Other (See Comments)    Unknown    Penicillins Rash    Has patient had a PCN reaction causing immediate rash, facial/tongue/throat swelling, SOB or lightheadedness with hypotension: No Has patient had a PCN reaction causing severe rash involving mucus membranes or skin necrosis: No Has patient had a PCN reaction that required hospitalization: No Has patient had a PCN reaction occurring within the last 10 years: No If all of the above answers are "NO", then may proceed with Cephalosporin use.    Prior to Admission medications   Medication Sig Start Date End Date Taking? Authorizing Provider  LORazepam (ATIVAN) 0.5 MG tablet Take 0.5 mg by mouth daily. 10/23/23  Yes [provider]  memantine (NAMENDA) 5 MG tablet Take 5 mg by mouth 2 (two) times daily. 11/05/23  Yes [provider]  nitrofurantoin, macrocrystal-monohydrate, (MACROBID) 100 MG capsule Take 100 mg by mouth daily. 10/28/23  Yes [provider]  Omega-3 Fatty Acids (OMEGA 3 PO) Take 30 mLs by mouth daily. 800 mg of Fish Oil  per serving   Yes [provider]  sertraline (ZOLOFT) 100 MG tablet Take 100 mg by mouth daily. 11/05/23  Yes [provider]  SYNTHROID 75 MCG tablet TAKE 1 TABLET BY MOUTH DAILY BEFORE BREAKFAST. 06/18/22  Yes Sherlene Shams, MD   CT Cervical Spine Wo Contrast Result Date: 11/15/2023 CLINICAL DATA:  83 year old female status post unwitnessed fall in bathroom. Found down. Pain. EXAM: CT CERVICAL SPINE WITHOUT CONTRAST TECHNIQUE: Multidetector CT imaging of the cervical spine was performed without intravenous contrast. Multiplanar CT image reconstructions were also generated. RADIATION DOSE REDUCTION: This exam was performed according to the departmental dose-optimization program which includes automated exposure control, adjustment of the mA and/or kV according to patient size and/or use of iterative reconstruction technique. COMPARISON:  Head CT today.  Cervical  spine CT 08/13/2021. FINDINGS: Alignment: Mildly improved cervical lordosis. Cervicothoracic junction alignment is within normal limits. Bilateral posterior element alignment is within normal limits. Skull base and vertebrae: Motion artifact although less pronounced than on the head CT today. Chronic TMJ degeneration. No skull base fracture identified. No evidence of atlanto-occipital dissociation. C1 and C2 appear aligned, grossly intact. Other cervical vertebrae appear intact. Soft tissues and spinal canal: No prevertebral fluid or swelling. No visible canal hematoma. Negative noncontrast neck soft tissues. Disc levels:  Mild for age cervical spine degeneration. Upper chest: Visible upper thoracic levels appear intact, upper lungs are clear. IMPRESSION: Mild motion artifact. No acute traumatic injury identified in the cervical spine. Electronically Signed   By: Odessa Fleming M.D.   On: 11/15/2023 09:50  DG Chest Portable 1 View Result Date: 11/15/2023 CLINICAL DATA:  83 year old female status post unwitnessed fall in bathroom. Found down. Pain. EXAM: PORTABLE CHEST 1 VIEW COMPARISON:  Chest radiographs 09/23/2016. FINDINGS: Portable AP supine view at 0919 hours. Lung volumes and mediastinal contours are within normal limits. Visualized tracheal air column is within normal limits. Allowing for portable technique the lungs are clear. No pneumothorax or pleural effusion. No acute osseous abnormality identified. Negative visible bowel gas. IMPRESSION: No acute cardiopulmonary abnormality or acute traumatic injury identified. Electronically Signed   By: Odessa Fleming M.D.   On: 11/15/2023 09:46   DG Knee 2 Views Left Result Date: 11/15/2023 CLINICAL DATA:  83 year old female status post unwitnessed fall in bathroom. Found down. Pain. EXAM: LEFT KNEE - 1-2 VIEW COMPARISON:  None Available. FINDINGS: Two views at 0906 hours. Bone mineralization is within normal limits. Normal joint spaces and alignment for age. Patella  appears intact. No evidence of joint effusion. No acute osseous abnormality identified. IMPRESSION: No acute fracture or dislocation identified about the left knee. Electronically Signed   By: Odessa Fleming M.D.   On: 11/15/2023 09:44   DG Wrist Complete Left Result Date: 11/15/2023 CLINICAL DATA:  83 year old female status post unwitnessed fall in bathroom. Found down. Pain. EXAM: LEFT WRIST - COMPLETE 3+ VIEW COMPARISON:  None Available. FINDINGS: Four views at 0856 hours. Bone mineralization is within normal limits for age. Comminuted and dorsally impacted distal left radius fracture. DRU and radiocarpal joint involvement. Unclear whether the ulnar styloid is intact but there is a small 6 mm fracture fragment at the distal ulna metadiaphysis. Generalized soft tissue swelling. Carpal bones appear to remain intact and aligned. No metacarpal fracture identified. IMPRESSION: 1. Comminuted and dorsally impacted distal left radius fracture. 2. Small fracture fragment at the distal ulna metadiaphysis. Electronically Signed   By: Odessa Fleming M.D.   On: 11/15/2023 09:44   DG Hip Unilat W or Wo Pelvis 2-3 Views Left Result Date: 11/15/2023 CLINICAL DATA:  83 year old female status post unwitnessed fall in bathroom. Found down. Pain. EXAM: DG HIP (WITH OR WITHOUT PELVIS) 2-3V LEFT COMPARISON:  None Available. FINDINGS: Three views at 0914 hours. Left femoral neck fracture with varus impaction. The intertrochanteric segment appears to remain intact. The left femoral head appears to remain normally located. No superimposed pelvis fracture identified. Grossly intact proximal right femur. Bone mineralization is within normal limits for age. Symmetric SI joints. Nonobstructed bowel-gas pattern. IMPRESSION: Acute Left femoral neck fracture with varus impaction. Electronically Signed   By: Odessa Fleming M.D.   On: 11/15/2023 09:42   CT HEAD WO CONTRAST ( ) Result Date: 11/15/2023 CLINICAL DATA:  83 year old female status post  unwitnessed fall in bathroom. Found down. Pain. EXAM: CT HEAD WITHOUT CONTRAST TECHNIQUE: Contiguous axial images were obtained from the base of the skull through the vertex without intravenous contrast. RADIATION DOSE REDUCTION: This exam was performed according to the departmental dose-optimization program which includes automated exposure control, adjustment of the mA and/or kV according to patient size and/or use of iterative reconstruction technique. COMPARISON:  Brain MRI 05/04/2015.  Head CT 08/13/2021. FINDINGS: Brain: Motion artifact throughout the posterior fossa. No evidence of intracranial mass effect or ventriculomegaly. No acute intracranial hemorrhage identified. No cortically based acute infarct identified. Vascular: Motion artifact at the skull base. Skull: Motion artifact throughout the skull base. No acute osseous abnormality identified. Sinuses/Orbits: Motion artifact. Other: Visualized scalp soft tissues are within normal limits. IMPRESSION: Motion artifact. No acute intracranial abnormality  or acute traumatic injury identified. Electronically Signed   By: Odessa Fleming M.D.   On: 11/15/2023 09:41    Positive ROS: All other systems have been reviewed and were otherwise negative with the exception of those mentioned in the HPI and as above.  Physical Exam: General: Alert, no acute distress Cardiovascular: No pedal edema Respiratory: No cyanosis, no use of accessory musculature GI: No organomegaly, abdomen is soft and non-tender Skin: No lesions in the area of chief complaint Neurologic: Sensation intact distally Psychiatric: Patient is competent for consent with normal mood and affect Lymphatic: No axillary or cervical lymphadenopathy  MUSCULOSKELETAL: Patient is alert but confused.  She complains of pain in the left wrist and hip region.  The left leg is minimally shortened and excellently rotated.  The skin is intact.  Neurovascular status is normal.  The right leg has good motion  without pain.  The left wrist is in a sugar-tong splint but has good circulation and sensation distally.  The right upper extremity is normal.  Head neck and spine seem normal.  Assessment: Displaced left subcapital hip fracture Displaced left distal radius fracture  Plan: Left hip hemiarthroplasty Closed, possible open reduction left wrist    Valinda Hoar, MD [phone]   11/15/2023 12:27 PM

## 2023-11-15 NOTE — H&P (Signed)
 History and Physical    Samantha Hanna XLK:[id number] DOB: [birth year] DOA: 11/15/2023  PCP: Sherlene Shams, MD (Confirm with patient/family/NH records and if not entered, this has to be entered at [facility] point of entry) Patient coming from: SNF, memory care unit  I have personally briefly reviewed patient's old medical records in [facility]  Chief Complaint: I fell and broke my wrist and hip  HPI: Samantha Hanna is a 83 y.o. female with medical history significant of dementia, hypothyroidism, arthritis, was found on the floor after a unwitnessed fall.  Patient has had worsening of generalized weakness for last few months and sustained a fall this morning " knees give up" and hit her left side.  Excruciating pain associated with left wrist and left hip and unable to get up again by herself.  Denied any prodromes of lightheadedness chest pain shortness of breath blurry vision before or after the fall.  Patient denied any recent diarrhea, no dysuria no cough. ED Course: Afebrile, blood pressure 159/82, heart rate 68 x-ray showed negative CT head and neck study for acute intracranial findings were fracture with dislocation or subluxation, hip image showed left femoral neck fracture, left wrist x-ray showed left radius fracture and left distal ulnar metadiaphysis fracture.  CBC BMP largely normal  Review of Systems: As per HPI otherwise 14 point review of systems negative.    Past Medical History:  Diagnosis Date   Arthritis    Cancer (HCC)    SKIN CANCER-BASAL CELL   GERD (gastroesophageal reflux disease)    Hyperlipidemia    Hypothyroidism    Thyroid disease     Past Surgical History:  Procedure Laterality Date   APPENDECTOMY     CARPOMETACARPAL (CMC) FUSION OF THUMB Left 09/10/2017   Procedure: CARPOMETACARPAL (CMC) FUSION OF THUMB;  Surgeon: Deeann Saint, MD;  Location: [facility] ORS;  Service: Orthopedics;  Laterality: Left;   MELANOMA EXCISION     x 2, Dr. Jarold Motto     reports  that she has never smoked. She has never used smokeless tobacco. She reports current alcohol use of about 3.0 standard drinks of alcohol per week. She reports that she does not use drugs.  Allergies  Allergen Reactions   Hydrocodone     Hallucination   Shellfish Allergy Itching   Statins Other (See Comments)    Unknown    Penicillins Rash    Has patient had a PCN reaction causing immediate rash, facial/tongue/throat swelling, SOB or lightheadedness with hypotension: No Has patient had a PCN reaction causing severe rash involving mucus membranes or skin necrosis: No Has patient had a PCN reaction that required hospitalization: No Has patient had a PCN reaction occurring within the last 10 years: No If all of the above answers are "NO", then may proceed with Cephalosporin use.     Family History  Problem Relation Age of Onset   Osteoporosis Mother        Deceased, 63   Coronary artery disease Father 47       Deceased, 87 in MVA   Heart Problems Father    Arthritis Daughter 46       Rheumatiod   Healthy Daughter      Prior to Admission medications   Medication Sig Start Date End Date Taking? Authorizing Provider  Omega-3 Fatty Acids (OMEGA 3 PO) Take 30 mLs by mouth daily. 800 mg of Fish Oil  per serving    [provider]  SYNTHROID 75 MCG tablet TAKE  1 TABLET BY MOUTH DAILY BEFORE BREAKFAST. 06/18/22   Sherlene Shams, MD    Physical Exam: Vitals:   11/15/23 0827  BP: (!) 159/82  Pulse: 68  Resp: 18  Temp: 98.3 F (36.8 C)  TempSrc: Oral  SpO2: 100%    Constitutional: NAD, calm, comfortable Vitals:   11/15/23 0827  BP: (!) 159/82  Pulse: 68  Resp: 18  Temp: 98.3 F (36.8 C)  TempSrc: Oral  SpO2: 100%   Eyes: PERRL, lids and conjunctivae normal ENMT: Mucous membranes are moist. Posterior pharynx clear of any exudate or lesions.Normal dentition.  Neck: normal, supple, no masses, no thyromegaly Respiratory: clear to auscultation bilaterally, no  wheezing, no crackles. Normal respiratory effort. No accessory muscle use.  Cardiovascular: Regular rate and rhythm, no murmurs / rubs / gallops. No extremity edema. 2+ pedal pulses. No carotid bruits.  Abdomen: no tenderness, no masses palpated. No hepatosplenomegaly. Bowel sounds positive.  Musculoskeletal: Left wrist deformity, left leg shortened and rotated Skin: no rashes, lesions, ulcers. No induration Neurologic: CN 2-12 grossly intact. Sensation intact, DTR normal. Strength 5/5 in all 4.  Psychiatric: Normal judgment and insight. Alert and oriented x 3. Normal mood.     Labs on Admission: I have personally reviewed following labs and imaging studies  CBC: Recent Labs  Lab 11/15/23 0827  WBC 6.6  NEUTROABS 4.9  HGB 12.7  HCT 38.1  MCV 94.1  PLT 177   Basic Metabolic Panel: Recent Labs  Lab 11/15/23 0827  NA 138  K 3.6  CL 105  CO2 25  GLUCOSE 135*  BUN 15  CREATININE 0.77  CALCIUM 8.9   GFR: CrCl cannot be calculated (Unknown ideal weight.). Liver Function Tests: Recent Labs  Lab 11/15/23 0827  AST 20  ALT 16  ALKPHOS 60  BILITOT 0.8  PROT 6.8  ALBUMIN 4.0   No results for input(s): "LIPASE", "AMYLASE" in the last 168 hours. No results for input(s): "AMMONIA" in the last 168 hours. Coagulation Profile: No results for input(s): "INR", "PROTIME" in the last 168 hours. Cardiac Enzymes: Recent Labs  Lab 11/15/23 0827  CKTOTAL 58   BNP (last 3 results) No results for input(s): "PROBNP" in the last 8760 hours. HbA1C: No results for input(s): "HGBA1C" in the last 72 hours. CBG: No results for input(s): "GLUCAP" in the last 168 hours. Lipid Profile: No results for input(s): "CHOL", "HDL", "LDLCALC", "TRIG", "CHOLHDL", "LDLDIRECT" in the last 72 hours. Thyroid Function Tests: No results for input(s): "TSH", "T4TOTAL", "FREET4", "T3FREE", "THYROIDAB" in the last 72 hours. Anemia Panel: No results for input(s): "VITAMINB12", "FOLATE", "FERRITIN",  "TIBC", "IRON", "RETICCTPCT" in the last 72 hours. Urine analysis:    Component Value Date/Time   COLORURINE YELLOW 09/08/2017 1218   APPEARANCEUR CLEAR 09/08/2017 1218   LABSPEC 1.025 09/08/2017 1218   PHURINE 5.5 09/08/2017 1218   GLUCOSEU NEGATIVE 09/08/2017 1218   HGBUR NEGATIVE 09/08/2017 1218   BILIRUBINUR NEGATIVE 09/08/2017 1218   BILIRUBINUR neg 04/17/2015 1712   KETONESUR TRACE (A) 09/08/2017 1218   PROTEINUR neg 04/17/2015 1712   UROBILINOGEN 0.2 09/08/2017 1218   NITRITE NEGATIVE 09/08/2017 1218   LEUKOCYTESUR NEGATIVE 09/08/2017 1218    Radiological Exams on Admission: CT Cervical Spine Wo Contrast Result Date: 11/15/2023 CLINICAL DATA:  83 year old female status post unwitnessed fall in bathroom. Found down. Pain. EXAM: CT CERVICAL SPINE WITHOUT CONTRAST TECHNIQUE: Multidetector CT imaging of the cervical spine was performed without intravenous contrast. Multiplanar CT image reconstructions were also generated. RADIATION DOSE REDUCTION:  This exam was performed according to the departmental dose-optimization program which includes automated exposure control, adjustment of the mA and/or kV according to patient size and/or use of iterative reconstruction technique. COMPARISON:  Head CT today.  Cervical spine CT 08/13/2021. FINDINGS: Alignment: Mildly improved cervical lordosis. Cervicothoracic junction alignment is within normal limits. Bilateral posterior element alignment is within normal limits. Skull base and vertebrae: Motion artifact although less pronounced than on the head CT today. Chronic TMJ degeneration. No skull base fracture identified. No evidence of atlanto-occipital dissociation. C1 and C2 appear aligned, grossly intact. Other cervical vertebrae appear intact. Soft tissues and spinal canal: No prevertebral fluid or swelling. No visible canal hematoma. Negative noncontrast neck soft tissues. Disc levels:  Mild for age cervical spine degeneration. Upper chest: Visible  upper thoracic levels appear intact, upper lungs are clear. IMPRESSION: Mild motion artifact. No acute traumatic injury identified in the cervical spine. Electronically Signed   By: Odessa Fleming M.D.   On: 11/15/2023 09:50   DG Chest Portable 1 View Result Date: 11/15/2023 CLINICAL DATA:  83 year old female status post unwitnessed fall in bathroom. Found down. Pain. EXAM: PORTABLE CHEST 1 VIEW COMPARISON:  Chest radiographs 09/23/2016. FINDINGS: Portable AP supine view at 0919 hours. Lung volumes and mediastinal contours are within normal limits. Visualized tracheal air column is within normal limits. Allowing for portable technique the lungs are clear. No pneumothorax or pleural effusion. No acute osseous abnormality identified. Negative visible bowel gas. IMPRESSION: No acute cardiopulmonary abnormality or acute traumatic injury identified. Electronically Signed   By: Odessa Fleming M.D.   On: 11/15/2023 09:46   DG Knee 2 Views Left Result Date: 11/15/2023 CLINICAL DATA:  83 year old female status post unwitnessed fall in bathroom. Found down. Pain. EXAM: LEFT KNEE - 1-2 VIEW COMPARISON:  None Available. FINDINGS: Two views at 0906 hours. Bone mineralization is within normal limits. Normal joint spaces and alignment for age. Patella appears intact. No evidence of joint effusion. No acute osseous abnormality identified. IMPRESSION: No acute fracture or dislocation identified about the left knee. Electronically Signed   By: Odessa Fleming M.D.   On: 11/15/2023 09:44   DG Wrist Complete Left Result Date: 11/15/2023 CLINICAL DATA:  83 year old female status post unwitnessed fall in bathroom. Found down. Pain. EXAM: LEFT WRIST - COMPLETE 3+ VIEW COMPARISON:  None Available. FINDINGS: Four views at 0856 hours. Bone mineralization is within normal limits for age. Comminuted and dorsally impacted distal left radius fracture. DRU and radiocarpal joint involvement. Unclear whether the ulnar styloid is intact but there is a small 6 mm  fracture fragment at the distal ulna metadiaphysis. Generalized soft tissue swelling. Carpal bones appear to remain intact and aligned. No metacarpal fracture identified. IMPRESSION: 1. Comminuted and dorsally impacted distal left radius fracture. 2. Small fracture fragment at the distal ulna metadiaphysis. Electronically Signed   By: Odessa Fleming M.D.   On: 11/15/2023 09:44   DG Hip Unilat W or Wo Pelvis 2-3 Views Left Result Date: 11/15/2023 CLINICAL DATA:  83 year old female status post unwitnessed fall in bathroom. Found down. Pain. EXAM: DG HIP (WITH OR WITHOUT PELVIS) 2-3V LEFT COMPARISON:  None Available. FINDINGS: Three views at 0914 hours. Left femoral neck fracture with varus impaction. The intertrochanteric segment appears to remain intact. The left femoral head appears to remain normally located. No superimposed pelvis fracture identified. Grossly intact proximal right femur. Bone mineralization is within normal limits for age. Symmetric SI joints. Nonobstructed bowel-gas pattern. IMPRESSION: Acute Left femoral neck fracture with  varus impaction. Electronically Signed   By: Odessa Fleming M.D.   On: 11/15/2023 09:42   CT HEAD WO CONTRAST ( ) Result Date: 11/15/2023 CLINICAL DATA:  83 year old female status post unwitnessed fall in bathroom. Found down. Pain. EXAM: CT HEAD WITHOUT CONTRAST TECHNIQUE: Contiguous axial images were obtained from the base of the skull through the vertex without intravenous contrast. RADIATION DOSE REDUCTION: This exam was performed according to the departmental dose-optimization program which includes automated exposure control, adjustment of the mA and/or kV according to patient size and/or use of iterative reconstruction technique. COMPARISON:  Brain MRI 05/04/2015.  Head CT 08/13/2021. FINDINGS: Brain: Motion artifact throughout the posterior fossa. No evidence of intracranial mass effect or ventriculomegaly. No acute intracranial hemorrhage identified. No cortically based  acute infarct identified. Vascular: Motion artifact at the skull base. Skull: Motion artifact throughout the skull base. No acute osseous abnormality identified. Sinuses/Orbits: Motion artifact. Other: Visualized scalp soft tissues are within normal limits. IMPRESSION: Motion artifact. No acute intracranial abnormality or acute traumatic injury identified. Electronically Signed   By: Odessa Fleming M.D.   On: 11/15/2023 09:41    EKG: Independently reviewed.  Sinus rhythm, chronic QRS changes on V1 and V2, otherwise no acute ST changes.  Assessment/Plan Principal Problem:   Hip fracture (HCC) Active Problems:   Hip fracture, unspecified laterality, closed, initial encounter (HCC)   Left wrist fracture, closed, initial encounter  (please populate well all problems here in Problem List. (For example, if patient is on BP meds at home and you resume or decide to hold them, it is a problem that needs to be her. Same for CAD, COPD, HLD and so on)  Mechanical fall Left femoral neck fracture Left radius and distal ulnar metadiaphysis fracture -N.p.o. ORIF this afternoon as per orthopedic surgery Dr. Hyacinth Meeker -Family reported the patient has no history of CAD CHF stroke or diabetes, she goes to the gym every day with moderate amount of exercise at nursing home and never complained of chest pain or shortness of breath.  Calculated perioperative and no major cardiac risk 3.9%, medically cleared for general anesthesia and ORIF with acceptable risk.  Hypothyroidism -Continue Synthroid  Mild protein calorie malnutrition -Consult dietitian -Check vitamin D level DVT prophylaxis: Lovenox Code Status: Full code, confirmed with daughter over the phone Family Communication: Daughter over the phone Disposition Plan: Patient is sick with left hip fracture requiring ORIF, expect more than 2 midnight hospital stay Consults called: Orthopedic surgery Admission status: MedSurg admission   Emeline General MD [facility] Pager [phone]  11/15/2023, 10:33 AM

## 2023-11-15 NOTE — ED Triage Notes (Signed)
 Pt arrives via EMS from [facility] for an unwitnessed fall in the bathroom. Pt doesn't remember how long she had been on the floor and was still down when EMS got there. Pt complains of L hip and wrist pain.   EMS vitals: 164/80 BP 70 HR 98% SPO2 on RA 98.54F

## 2023-11-15 NOTE — Transfer of Care (Signed)
 Immediate Anesthesia Transfer of Care Note  Patient: Samantha Hanna  Procedure(s) Performed: HEMIARTHROPLASTY (BIPOLAR) HIP, POSTERIOR APPROACH FOR FRACTURE (Left) CLOSED REDUCTION, WRIST (Left: Wrist)  Patient Location: PACU  Anesthesia Type:Spinal  Level of Consciousness: drowsy  Airway & Oxygen Therapy: Patient Spontanous Breathing and Patient connected to face mask oxygen  Post-op Assessment: Report given to RN  Post vital signs: stable  Last Vitals:  Vitals Value Taken Time  BP 106/64 11/15/23 1727  Temp    Pulse 69 11/15/23 1730  Resp 19 11/15/23 1730  SpO2 100 % 11/15/23 1730  Vitals shown include unfiled device data.  Last Pain:  Vitals:   11/15/23 0943  TempSrc:   PainSc: 8          Complications: No notable events documented.

## 2023-11-15 NOTE — H&P (Signed)
THE PATIENT WAS SEEN PRIOR TO SURGERY TODAY.  HISTORY, ALLERGIES, HOME MEDICATIONS AND OPERATIVE PROCEDURE WERE REVIEWED. RISKS AND BENEFITS OF SURGERY DISCUSSED WITH PATIENT AGAIN.  NO CHANGES FROM INITIAL HISTORY AND PHYSICAL NOTED.    

## 2023-11-15 NOTE — Anesthesia Postprocedure Evaluation (Signed)
 Anesthesia Post Note  Patient: Samantha Hanna  Procedure(s) Performed: HEMIARTHROPLASTY (BIPOLAR) HIP, POSTERIOR APPROACH FOR FRACTURE (Left) CLOSED REDUCTION, WRIST (Left: Wrist)  Patient location during evaluation: PACU Anesthesia Type: Spinal Level of consciousness: awake and confused Pain management: pain level controlled Vital Signs Assessment: post-procedure vital signs reviewed and stable Respiratory status: spontaneous breathing, nonlabored ventilation and respiratory function stable Cardiovascular status: blood pressure returned to baseline and stable Postop Assessment: no apparent nausea or vomiting Anesthetic complications: no   No notable events documented.   Last Vitals:  Vitals:   11/15/23 1815 11/15/23 2016  BP: 106/67 105/62  Pulse: 60 (!) 58  Resp: 12 13  Temp: 36.4 C 36.7 C  SpO2: 100% 98%    Last Pain:  Vitals:   11/15/23 2016  TempSrc: Oral  PainSc: Asleep                 Foye Deer

## 2023-11-15 NOTE — Anesthesia Procedure Notes (Signed)
 Spinal  Patient location during procedure: OR Start time: 11/15/2023 3:15 PM End time: 11/15/2023 3:17 PM Reason for block: surgical anesthesia Staffing Performed: anesthesiologist  Anesthesiologist: Foye Deer, MD Performed by: Foye Deer, MD Authorized by: Foye Deer, MD   Preanesthetic Checklist Completed: patient identified, IV checked, site marked, risks and benefits discussed, surgical consent, monitors and equipment checked, pre-op evaluation and timeout performed Spinal Block Patient position: sitting Prep: DuraPrep Patient monitoring: heart rate, cardiac monitor, continuous pulse ox and blood pressure Approach: midline Location: L3-4 Injection technique: single-shot Needle Needle type: Pencan  Needle gauge: 24 G Needle length: 9 cm Assessment Sensory level: T10 Events: CSF return

## 2023-11-15 NOTE — ED Notes (Signed)
 Pts 3 silver rings taken off and put in pt belongings bag that is left at bedside with pt.

## 2023-11-15 NOTE — Plan of Care (Signed)

## 2023-11-15 NOTE — Op Note (Signed)
       11/15/2023  5:30 PM  PATIENT:  Samantha Hanna    PRE-OPERATIVE DIAGNOSIS:  Closed fracture of left distal radius  POST-OPERATIVE DIAGNOSIS:  Same  PROCEDURE:  CLOSED REDUCTION, WRIST left with sugar-tong splint  SURGEON:  Valinda Hoar, MD  ANESTHESIA:   General  PREOPERATIVE INDICATIONS:  Samantha Hanna is a  83 y.o. female with a diagnosis of Closed fracture of left distal radius] who  elected for surgical management of the fracture  The risks benefits and alternatives were discussed with the patient preoperatively including but not limited to the risks of infection, bleeding, nerve injury, cardiopulmonary complications, the need for revision surgery, among others, and the patient was willing to proceed.  EBL: None CC  TOURNIQUET TIME: None MIN  OPERATIVE IMPLANTS: None  OPERATIVE FINDINGS: Displaced distal radial fracture nonarticular  OPERATIVE PROCEDURE: Prior to the hip arthroplasty the left wrist was prepped sterilely with Betadine and a fracture site infiltrated with quarter percent Marcaine with epinephrine following the arthroplasty of the hip the left wrist was closed reduced in a closed fashion and evaluated under fluoroscopy.  Excellent reduction on AP and lateral views was were obtained.  Radial length was restored.  The arm was the immobilized with a sugar-tong splint and repeat fluoroscopy showed the fracture reduction was well-maintained.  The patient was transferred to a hospital bed and taken to the recovery room in good condition.  Valinda Hoar, MD

## 2023-11-16 DIAGNOSIS — S72002A Fracture of unspecified part of neck of left femur, initial encounter for closed fracture: Secondary | ICD-10-CM | POA: Diagnosis not present

## 2023-11-16 LAB — CBC
HCT: 32.1 % — ABNORMAL LOW (ref 36.0–46.0)
Hemoglobin: 10.7 g/dL — ABNORMAL LOW (ref 12.0–15.0)
MCH: 32.3 pg (ref 26.0–34.0)
MCHC: 33.3 g/dL (ref 30.0–36.0)
MCV: 97 fL (ref 80.0–100.0)
Platelets: 141 10*3/uL — ABNORMAL LOW (ref 150–400)
RBC: 3.31 MIL/uL — ABNORMAL LOW (ref 3.87–5.11)
RDW: 12.7 % (ref 11.5–15.5)
WBC: 16.1 10*3/uL — ABNORMAL HIGH (ref 4.0–10.5)
nRBC: 0 % (ref 0.0–0.2)

## 2023-11-16 LAB — BASIC METABOLIC PANEL
Anion gap: 6 (ref 5–15)
BUN: 17 mg/dL (ref 8–23)
CO2: 25 mmol/L (ref 22–32)
Calcium: 8.1 mg/dL — ABNORMAL LOW (ref 8.9–10.3)
Chloride: 103 mmol/L (ref 98–111)
Creatinine, Ser: 0.65 mg/dL (ref 0.44–1.00)
GFR, Estimated: >60 mL/min (ref >60)
Glucose, Bld: 145 mg/dL — ABNORMAL HIGH (ref 70–99)
Potassium: 3.4 mmol/L — ABNORMAL LOW (ref 3.5–5.1)
Sodium: 134 mmol/L — ABNORMAL LOW (ref 135–145)

## 2023-11-16 NOTE — Plan of Care (Signed)
  Problem: Activity: Goal: Risk for activity intolerance will decrease Outcome: Progressing   Problem: Coping: Goal: Level of anxiety will decrease Outcome: Progressing   Problem: Pain Managment: Goal: General experience of comfort will improve and/or be controlled Outcome: Progressing   Problem: Safety: Goal: Ability to remain free from injury will improve Outcome: Progressing   Problem: Skin Integrity: Goal: Risk for impaired skin integrity will decrease Outcome: Progressing   Problem: Education: Goal: Verbalization of understanding the information provided (i.e., activity precautions, restrictions, etc) will improve Outcome: Adequate for Discharge

## 2023-11-16 NOTE — Progress Notes (Signed)
 Subjective: 1 Day Post-Op Procedure(s) (LRB): HEMIARTHROPLASTY (BIPOLAR) HIP, POSTERIOR APPROACH FOR FRACTURE (Left) CLOSED REDUCTION, WRIST (Left) Patient is awake and alert but quite confused.  She is quite fidgety.  She is torn her dressing off twice.  She has tried to get the abduction pillow off.  Hemoglobin is stable.  Hip is stable.  She is at high risk for hip dislocation due to her hyperactivity.  I discussed this with the family at length.  Patient reports pain as mild.  Objective:   VITALS:   Vitals:   11/16/23 1619 11/16/23 1949  BP: (!) 103/56 100/73  Pulse: 67 61  Resp: 14   Temp: 97.6 F (36.4 C) 98.5 F (36.9 C)  SpO2: 95% 100%    Neurologically intact Neurovascular intact Incision: no drainage  LABS Recent Labs    11/15/23 0827 11/16/23 0221  HGB 12.7 10.7*  HCT 38.1 32.1*  WBC 6.6 16.1*  PLT 177 141*    Recent Labs    11/15/23 0827 11/16/23 0221  NA 138 134*  K 3.6 3.4*  BUN 15 17  CREATININE 0.77 0.65  GLUCOSE 135* 145*    Recent Labs    11/15/23 1154  INR 1.1     Assessment/Plan: 1 Day Post-Op Procedure(s) (LRB): HEMIARTHROPLASTY (BIPOLAR) HIP, POSTERIOR APPROACH FOR FRACTURE (Left) CLOSED REDUCTION, WRIST (Left)   Advance diet Up with therapy Discharge to SNF when medically indicated Return to office 2 weeks after discharge for x-rays Discharge on 81 mg ASA twice daily for 6 weeks.

## 2023-11-16 NOTE — Evaluation (Addendum)
 Occupational Therapy Evaluation Patient Details Name: Samantha Hanna MRN: [id number] DOB: Jul 10, 1941 Today's Date: 11/16/2023   History of Present Illness   presented to ER after unwitnessed fall at memory care facility, acute onset of L hip and L wrist pain; admitted for management of displaced subcaptical fracture of L hip, L distal radial/ulnar fracture s/p L hip hemiarthroplasty (posterior approach, WBAT) and closed reduction of L wrist (NWB) 11/15/23.     Clinical Impressions Chart reviewed, pt greeted in room with PT present, oriented to self, agreeable to OT evaluation. PTA pt lives in [facility], supervision for ADLs with assist as needed, assist for IADLs. Pt is internally/externally distracted and restless throughout. She is pleasant however and agreeable to tasks at hand. Pt presents with deficits in strength, endurance, activity tolerance, balance, cognition affecting safe and optimal ADL completion. MAX A +2 required for bed mobility, STS with MOD A +2 with L platform RW and via HHA; Short amb transfer via HHA with MOD A +2, amb in room approx 10' with LPFRW with MOD A +2.  Step by step multi modal cues throughout for one step direction following. Pt is left in bedside chair, all needs met. Ordered posey activity board through Diplomatic Services operational officer for patient. Pt will benefit from acute OT to address deficits and to facilitate opitmal ADL performance. OT will follow acutely.      If plan is discharge home, recommend the following:   A lot of help with walking and/or transfers;A lot of help with bathing/dressing/bathroom;Supervision due to cognitive status     Functional Status Assessment   Patient has had a recent decline in their functional status and demonstrates the ability to make significant improvements in function in a reasonable and predictable amount of time.     Equipment Recommendations   Other (comment) (defer to next venue of care)     Recommendations for  Other Services         Precautions/Restrictions   Precautions Precautions: Fall Required Braces or Orthoses: Splint/Cast Splint/Cast: L UE Restrictions Weight Bearing Restrictions Per Provider Order: Yes LUE Weight Bearing Per Provider Order: Non weight bearing LLE Weight Bearing Per Provider Order: Weight bearing as tolerated     Mobility Bed Mobility Overal bed mobility: Needs Assistance Bed Mobility: Supine to Sit     Supine to sit: Max assist, +2 for physical assistance     General bed mobility comments: step by step multi modal cues    Transfers Overall transfer level: Needs assistance Equipment used: Left platform walker, 2 person hand held assist Transfers: Sit to/from Stand, Bed to chair/wheelchair/BSC Sit to Stand: Mod assist, +2 safety/equipment Stand pivot transfers: Mod assist, +2 safety/equipment         General transfer comment: step by step multi modal cues throughout      Balance Overall balance assessment: Needs assistance Sitting-balance support: Feet supported, No upper extremity supported Sitting balance-Leahy Scale: Good     Standing balance support: Bilateral upper extremity supported Standing balance-Leahy Scale: Poor                             ADL either performed or assessed with clinical judgement   ADL Overall ADL's : Needs assistance/impaired                     Lower Body Dressing: Maximal assistance   Toilet Transfer: Moderate assistance;BSC/3in1;Rolling walker (2 wheels);Cueing for safety;Cueing for sequencing;+2 for  safety/equipment;+2 for physical assistance Toilet Transfer Details (indicate cue type and reason): L platform walker, step by step mutli modal cues Toileting- Clothing Manipulation and Hygiene: Maximal assistance;Sitting/lateral lean;Sit to/from stand Toileting - Clothing Manipulation Details (indicate cue type and reason): anticipate     Functional mobility during ADLs: Moderate  assistance;Rolling walker (2 wheels);+2 for physical assistance;+2 for safety/equipment (L PFRW approx 10' with step by step multi modal cues)       Vision   Additional Comments: will continue to assess     Perception         Praxis         Pertinent Vitals/Pain Pain Assessment Pain Assessment: PAINAD Breathing: normal Negative Vocalization: none Facial Expression: smiling or inexpressive Body Language: relaxed Consolability: no need to console PAINAD Score: 0 Pain Location: L hip Pain Descriptors / Indicators: Aching, Grimacing Pain Intervention(s): Monitored during session, Premedicated before session, Repositioned, Limited activity within patient's tolerance     Extremity/Trunk Assessment Upper Extremity Assessment Upper Extremity Assessment: LUE deficits/detail LUE Deficits / Details: L elbow/wrist immobilized, fingers appear WFL   Lower Extremity Assessment Lower Extremity Assessment: Defer to PT evaluation RLE Deficits / Details: grossly 4+/5 LLE Deficits / Details: grossly 3-/5, limited by pain; hand-over-hand assist required to initiate and complete movement activities       Communication Communication Communication: No apparent difficulties   Cognition Arousal: Alert Behavior During Therapy: Restless Cognition: History of cognitive impairments, Cognition impaired   Orientation impairments: Place, Time, Situation Awareness: Intellectual awareness impaired, Online awareness impaired Memory impairment (select all impairments): Short-term memory, Working [employer] fast streamer, Non-declarative long-term memory, Geneticist, molecular long-term memory Attention impairment (select first level of impairment): Focused attention Executive functioning impairment (select all impairments): Initiation, Organization, Sequencing, Reasoning, Problem solving                   Following commands: Impaired Following commands impaired: Follows one step commands with increased time, Follows  one step commands inconsistently     Cueing  General Comments   Cueing Techniques: Verbal cues;Gestural cues;Tactile cues      Exercises Other Exercises Other Exercises: edu re: role of rehab   Shoulder Instructions      Home Living Family/patient expects to be discharged to::  [facility])                                        Prior Functioning/Environment Prior Level of Function : Needs assist             Mobility Comments: Per daughter, supervision level for basic transfers and mobility within memory care unit      OT Problem List: Decreased strength;Decreased activity tolerance;Decreased knowledge of use of DME or AE;Decreased safety awareness;Impaired balance (sitting and/or standing);Decreased cognition;Decreased knowledge of precautions;Pain;Impaired UE functional use   OT Treatment/Interventions: Self-care/ADL training;DME and/or AE instruction;Therapeutic activities;Balance training;Therapeutic exercise;Energy conservation;Patient/family education      OT Goals(Current goals can be found in the care plan section)   Acute Rehab OT Goals Patient Stated Goal: try to pee OT Goal Formulation: With patient/family Time For Goal Achievement: 11/30/23 Potential to Achieve Goals: Good ADL Goals Pt Will Perform Grooming: with supervision;sitting Pt Will Perform Lower Body Dressing: with mod assist;sitting/lateral leans;sit to/from stand Pt Will Transfer to Toilet: with min assist;ambulating Pt Will Perform Toileting - Clothing Manipulation and hygiene: with mod assist;sitting/lateral leans;sit to/from stand   OT Frequency:  Min 2X/week    Co-evaluation              AM-PAC OT "6 Clicks" Daily Activity     Outcome Measure Help from another person eating meals?: A Little Help from another person taking care of personal grooming?: A Little Help from another person toileting, which includes using toliet, bedpan, or urinal?: A  Lot Help from another person bathing (including washing, rinsing, drying)?: A Lot Help from another person to put on and taking off regular upper body clothing?: A Lot Help from another person to put on and taking off regular lower body clothing?: A Lot 6 Click Score: 14   End of Session Equipment Utilized During Treatment: Other (comment);Gait belt (L PFRW)  Activity Tolerance: Patient tolerated treatment well Patient left: in chair;with call bell/phone within reach;with chair alarm set  OT Visit Diagnosis: Other abnormalities of gait and mobility (R26.89);Muscle weakness (generalized) (M62.81)                Time: 7253-6644 OT Time Calculation (min): 28 min Charges:  OT General Charges $OT Visit: 1 Visit OT Evaluation $OT Eval Moderate Complexity: 1 Mod  Oleta Mouse, OTD OTR/L  11/16/23, 1:57 PM

## 2023-11-16 NOTE — Progress Notes (Signed)
 PROGRESS NOTE    Samantha Hanna  KDX:[id number] DOB: [birth year] DOA: 11/15/2023 PCP: Sherlene Shams, MD   Assessment & Plan:   Principal Problem:   Hip fracture [facility]) Active Problems:   Hip fracture, unspecified laterality, closed, initial encounter (HCC)   Left wrist fracture, closed, initial encounter  Assessment and Plan: Left femoral neck fracture: secondary to mechanical fall at home. S/p left hip hemiarthroplasty w/ stryker accolade prosthesis. PT/PT recs SNF   Left radius and distal ulnar metadiaphysis fracture: secondary to mechanical fall at home. S/p closed reduction of left wrist as per ortho surg   Acute blood loss anemia: likely secondary to recent above surg. No need for a transfusion currently    Hypothyroidism: continue on home dose of synthroid    Mild protein calorie malnutrition: continue on nutritional supplements   Thrombocytopenia: etiology unclear. Will continue to monitor      DVT prophylaxis: lovenox  Code Status: full  Family Communication: discussed pt's care w/ pt's daughter, Jacki Cones, at bedside and answered her questions Disposition Plan: d/c to SNF  Level of care: Med-Surg  Status is: Inpatient Remains inpatient appropriate because: needs SNF placement     Consultants:  Ortho surg  Procedures:   Antimicrobials:    Subjective: Pt c/o hip pain   Objective: Vitals:   11/15/23 2016 11/16/23 0331 11/16/23 0544 11/16/23 0809  BP: 105/62  104/87 (!) 114/49  Pulse: (!) 58   67  Resp: 13  14 14   Temp: 98.1 F (36.7 C)  98.4 F (36.9 C) 97.8 F (36.6 C)  TempSrc: Oral  Oral Oral  SpO2: 98%  100% 98%  Weight:  61.6 kg    Height:  5\' 7"  (1.702 m)      Intake/Output Summary (Last 24 hours) at 11/16/2023 0839 Last data filed at 11/16/2023 0725 Gross per 24 hour  Intake 658.25 ml  Output 2403 ml  Net -1744.75 ml   Filed Weights   11/16/23 0331  Weight: 61.6 kg    Examination:  General exam: Appears calm and comfortable   Respiratory system: Clear to auscultation. Respiratory effort normal. Cardiovascular system: S1 & S2+. No rubs, gallops or clicks.  Gastrointestinal system: Abdomen is nondistended, soft and nontender. Normal bowel sounds heard. Central nervous system: Alert and awake. Moves all extremities  Psychiatry: Judgement and insight appears at baseline. Mood & affect appropriate.     Data Reviewed: I have personally reviewed following labs and imaging studies  CBC: Recent Labs  Lab 11/15/23 0827 11/16/23 0221  WBC 6.6 16.1*  NEUTROABS 4.9  --   HGB 12.7 10.7*  HCT 38.1 32.1*  MCV 94.1 97.0  PLT 177 141*   Basic Metabolic Panel: Recent Labs  Lab 11/15/23 0827 11/16/23 0221  NA 138 134*  K 3.6 3.4*  CL 105 103  CO2 25 25  GLUCOSE 135* 145*  BUN 15 17  CREATININE 0.77 0.65  CALCIUM 8.9 8.1*   GFR: Estimated Creatinine Clearance: 54.5 mL/min (by C-G formula based on SCr of 0.65 mg/dL). Liver Function Tests: Recent Labs  Lab 11/15/23 0827  AST 20  ALT 16  ALKPHOS 60  BILITOT 0.8  PROT 6.8  ALBUMIN 4.0   No results for input(s): "LIPASE", "AMYLASE" in the last 168 hours. No results for input(s): "AMMONIA" in the last 168 hours. Coagulation Profile: Recent Labs  Lab 11/15/23 1154  INR 1.1   Cardiac Enzymes: Recent Labs  Lab 11/15/23 0827  CKTOTAL 58   BNP (last 3  results) No results for input(s): "PROBNP" in the last 8760 hours. HbA1C: No results for input(s): "HGBA1C" in the last 72 hours. CBG: No results for input(s): "GLUCAP" in the last 168 hours. Lipid Profile: No results for input(s): "CHOL", "HDL", "LDLCALC", "TRIG", "CHOLHDL", "LDLDIRECT" in the last 72 hours. Thyroid Function Tests: Recent Labs    11/15/23 1154  TSH 1.898   Anemia Panel: No results for input(s): "VITAMINB12", "FOLATE", "FERRITIN", "TIBC", "IRON", "RETICCTPCT" in the last 72 hours. Sepsis Labs: No results for input(s): "PROCALCITON", "LATICACIDVEN" in the last 168  hours.  No results found for this or any previous visit (from the past 240 hours).       Radiology Studies: DG HIP UNILAT W OR W/O PELVIS 2-3 VIEWS LEFT Result Date: 11/15/2023 CLINICAL DATA:  Postoperative closed left hip fracture. EXAM: DG HIP (WITH OR WITHOUT PELVIS) 2-3V LEFT COMPARISON:  11/15/2023 FINDINGS: Interval postoperative changes with placement of a left hip hemiarthroplasty. Non cemented component appears well seated. No evidence of new acute fracture or dislocation. Soft tissue gas and skin clips are consistent with recent surgery. Visualized pelvis appears intact. Degenerative changes in the right hip. IMPRESSION: Postoperative left hip replacement with components appearing well seated. No acute complication is demonstrated radiographically. Electronically Signed   By: Burman Nieves M.D.   On: 11/15/2023 22:00   DG MINI C-ARM IMAGE ONLY Result Date: 11/15/2023 There is no interpretation for this exam.  This order is for images obtained during a surgical procedure.  Please See "Surgeries" Tab for more information regarding the procedure.   CT Cervical Spine Wo Contrast Result Date: 11/15/2023 CLINICAL DATA:  83 year old female status post unwitnessed fall in bathroom. Found down. Pain. EXAM: CT CERVICAL SPINE WITHOUT CONTRAST TECHNIQUE: Multidetector CT imaging of the cervical spine was performed without intravenous contrast. Multiplanar CT image reconstructions were also generated. RADIATION DOSE REDUCTION: This exam was performed according to the departmental dose-optimization program which includes automated exposure control, adjustment of the mA and/or kV according to patient size and/or use of iterative reconstruction technique. COMPARISON:  Head CT today.  Cervical spine CT 08/13/2021. FINDINGS: Alignment: Mildly improved cervical lordosis. Cervicothoracic junction alignment is within normal limits. Bilateral posterior element alignment is within normal limits. Skull base and  vertebrae: Motion artifact although less pronounced than on the head CT today. Chronic TMJ degeneration. No skull base fracture identified. No evidence of atlanto-occipital dissociation. C1 and C2 appear aligned, grossly intact. Other cervical vertebrae appear intact. Soft tissues and spinal canal: No prevertebral fluid or swelling. No visible canal hematoma. Negative noncontrast neck soft tissues. Disc levels:  Mild for age cervical spine degeneration. Upper chest: Visible upper thoracic levels appear intact, upper lungs are clear. IMPRESSION: Mild motion artifact. No acute traumatic injury identified in the cervical spine. Electronically Signed   By: Odessa Fleming M.D.   On: 11/15/2023 09:50   DG Chest Portable 1 View Result Date: 11/15/2023 CLINICAL DATA:  83 year old female status post unwitnessed fall in bathroom. Found down. Pain. EXAM: PORTABLE CHEST 1 VIEW COMPARISON:  Chest radiographs 09/23/2016. FINDINGS: Portable AP supine view at 0919 hours. Lung volumes and mediastinal contours are within normal limits. Visualized tracheal air column is within normal limits. Allowing for portable technique the lungs are clear. No pneumothorax or pleural effusion. No acute osseous abnormality identified. Negative visible bowel gas. IMPRESSION: No acute cardiopulmonary abnormality or acute traumatic injury identified. Electronically Signed   By: Odessa Fleming M.D.   On: 11/15/2023 09:46   DG Knee 2  Views Left Result Date: 11/15/2023 CLINICAL DATA:  83 year old female status post unwitnessed fall in bathroom. Found down. Pain. EXAM: LEFT KNEE - 1-2 VIEW COMPARISON:  None Available. FINDINGS: Two views at 0906 hours. Bone mineralization is within normal limits. Normal joint spaces and alignment for age. Patella appears intact. No evidence of joint effusion. No acute osseous abnormality identified. IMPRESSION: No acute fracture or dislocation identified about the left knee. Electronically Signed   By: Odessa Fleming M.D.   On:  11/15/2023 09:44   DG Wrist Complete Left Result Date: 11/15/2023 CLINICAL DATA:  83 year old female status post unwitnessed fall in bathroom. Found down. Pain. EXAM: LEFT WRIST - COMPLETE 3+ VIEW COMPARISON:  None Available. FINDINGS: Four views at 0856 hours. Bone mineralization is within normal limits for age. Comminuted and dorsally impacted distal left radius fracture. DRU and radiocarpal joint involvement. Unclear whether the ulnar styloid is intact but there is a small 6 mm fracture fragment at the distal ulna metadiaphysis. Generalized soft tissue swelling. Carpal bones appear to remain intact and aligned. No metacarpal fracture identified. IMPRESSION: 1. Comminuted and dorsally impacted distal left radius fracture. 2. Small fracture fragment at the distal ulna metadiaphysis. Electronically Signed   By: Odessa Fleming M.D.   On: 11/15/2023 09:44   DG Hip Unilat W or Wo Pelvis 2-3 Views Left Result Date: 11/15/2023 CLINICAL DATA:  83 year old female status post unwitnessed fall in bathroom. Found down. Pain. EXAM: DG HIP (WITH OR WITHOUT PELVIS) 2-3V LEFT COMPARISON:  None Available. FINDINGS: Three views at 0914 hours. Left femoral neck fracture with varus impaction. The intertrochanteric segment appears to remain intact. The left femoral head appears to remain normally located. No superimposed pelvis fracture identified. Grossly intact proximal right femur. Bone mineralization is within normal limits for age. Symmetric SI joints. Nonobstructed bowel-gas pattern. IMPRESSION: Acute Left femoral neck fracture with varus impaction. Electronically Signed   By: Odessa Fleming M.D.   On: 11/15/2023 09:42   CT HEAD WO CONTRAST ( ) Result Date: 11/15/2023 CLINICAL DATA:  83 year old female status post unwitnessed fall in bathroom. Found down. Pain. EXAM: CT HEAD WITHOUT CONTRAST TECHNIQUE: Contiguous axial images were obtained from the base of the skull through the vertex without intravenous contrast. RADIATION DOSE  REDUCTION: This exam was performed according to the departmental dose-optimization program which includes automated exposure control, adjustment of the mA and/or kV according to patient size and/or use of iterative reconstruction technique. COMPARISON:  Brain MRI 05/04/2015.  Head CT 08/13/2021. FINDINGS: Brain: Motion artifact throughout the posterior fossa. No evidence of intracranial mass effect or ventriculomegaly. No acute intracranial hemorrhage identified. No cortically based acute infarct identified. Vascular: Motion artifact at the skull base. Skull: Motion artifact throughout the skull base. No acute osseous abnormality identified. Sinuses/Orbits: Motion artifact. Other: Visualized scalp soft tissues are within normal limits. IMPRESSION: Motion artifact. No acute intracranial abnormality or acute traumatic injury identified. Electronically Signed   By: Odessa Fleming M.D.   On: 11/15/2023 09:41        Scheduled Meds:  chlorhexidine   Topical Once   docusate sodium  100 mg Oral BID   enoxaparin (LOVENOX) injection  40 mg Subcutaneous Q24H   ferrous sulfate  325 mg Oral Q breakfast   gabapentin  200 mg Oral BID   levothyroxine  75 mcg Oral QAC breakfast   LORazepam  0.5 mg Oral Daily   memantine  5 mg Oral BID   nitrofurantoin (macrocrystal-monohydrate)  100 mg Oral Daily   sertraline  100 mg Oral Daily   Continuous Infusions:  sodium chloride 75 mL/hr at 11/16/23 0400   sodium chloride Stopped (11/15/23 1510)    ceFAZolin (ANCEF) IV 2 g (11/16/23 0539)     LOS: 1 day       Charise Killian, MD [facility] Pager [phone]  If 7PM-7AM, please contact night-coverage [email or web address] 11/16/2023, 8:39 AM

## 2023-11-16 NOTE — Evaluation (Signed)
 Physical Therapy Evaluation Patient Details Name: Samantha Hanna MRN: [id number] DOB: 13-Dec-1940 Today's Date: 11/16/2023  History of Present Illness  presented to ER after unwitnessed fall at memory care facility, acute onset of L hip and L wrist pain; admitted for management of displaced subcaptical fracture of L hip, L distal radial/ulnar fracture s/p L hip hemiarthroplasty (posterior approach, WBAT) and closed reduction of L wrist (NWB) 11/15/23.  Clinical Impression  Patient resting in bed upon arrival to room; supportive daughter present at bedside.  Patient notably restless, constantly fidgeting with covers, cast, ace wrap; constant redirection throughout session. Patient oriented to self only; does inconsistently follow simple commands.  Does require frequent redirection and step-by-step cuing due to cognitive deficits; very highly distractible by external environment. L LE strength (at least 3-/5) and ROM generally limited by pain, requiring act assist from therapist to initiate and complete movement throughout tolerable range. Currently requiring mod/max assist +2 for bed mobility; mod assist +2 for sit/stand, standing balance, basic transfers and short-distance gait (10') with L PFRW.  Demonstrates 3-point, step to gait pattern leading with L LE; decreased stance time, WBing and L hip/knee extension in loading phases of gait cycle. Constant cuing/assist for UE placement/position on RW and in use of device overall. Very highly distractible by external environment; constantly attempting use and WBing of L UE throughout session. Would benefit from skilled PT to address above deficits and promote optimal return to PLOF.; recommend post-acute PT follow up as indicated by interdisciplinary care team.          If plan is discharge home, recommend the following: Two people to help with walking and/or transfers;Two people to help with bathing/dressing/bathroom   Can travel by private vehicle    No    Equipment Recommendations    Recommendations for Other Services       Functional Status Assessment Patient has had a recent decline in their functional status and demonstrates the ability to make significant improvements in function in a reasonable and predictable amount of time.     Precautions / Restrictions Precautions Precautions: Fall Required Braces or Orthoses: Splint/Cast Splint/Cast: L UE Restrictions Weight Bearing Restrictions Per Provider Order: Yes LUE Weight Bearing Per Provider Order: Non weight bearing LLE Weight Bearing Per Provider Order: Weight bearing as tolerated      Mobility  Bed Mobility Overal bed mobility: Needs Assistance Bed Mobility: Supine to Sit     Supine to sit: Max assist, +2 for physical assistance          Transfers Overall transfer level: Needs assistance Equipment used: Left platform walker, 2 person hand held assist Transfers: Sit to/from Stand, Bed to chair/wheelchair/BSC Sit to Stand: Mod assist, +2 safety/equipment Stand pivot transfers: Mod assist, +2 safety/equipment         General transfer comment: constant, step-by-step cuing and manual facilitation to initiate, sequence and complete all functional activities.  Fair use of L PFRW with mobility progression, but continues to require max/dep assist for negotiation of device and maintenance of WBing precautions    Ambulation/Gait Ambulation/Gait assistance: Min assist, Mod assist, +2 safety/equipment Gait Distance (Feet): 10 Feet Assistive device: Left platform walker         General Gait Details: 3-point, step to gait pattern leading with L LE; decreased stance time, WBing and L hip/knee extension in loading phases of gait cycle.  Constant cuing/assist for UE placement/position on RW and in use of device overall.  Very highly distractible by external environment  Stairs  Wheelchair Mobility     Tilt Bed    Modified Rankin (Stroke  Patients Only)       Balance Overall balance assessment: Needs assistance Sitting-balance support: Feet supported, No upper extremity supported Sitting balance-Leahy Scale: Good     Standing balance support: Bilateral upper extremity supported Standing balance-Leahy Scale: Poor                               Pertinent Vitals/Pain Pain Assessment Pain Assessment: Faces Faces Pain Scale: Hurts little more Pain Location: L hip Pain Descriptors / Indicators: Aching, Grimacing Pain Intervention(s): Limited activity within patient's tolerance, Monitored during session, Premedicated before session, Repositioned    Home Living Family/patient expects to be discharged to::  [facility])                        Prior Function Prior Level of Function : Needs assist             Mobility Comments: Per daughter, supervision level for basic transfers and mobility within memory care unit       Extremity/Trunk Assessment   Upper Extremity Assessment Upper Extremity Assessment: LUE deficits/detail LUE Deficits / Details: L elbow/wrist immobilized in splint with ace-wrap; fingers WFL    Lower Extremity Assessment Lower Extremity Assessment: LLE deficits/detail;RLE deficits/detail RLE Deficits / Details: grossly 4+/5 LLE Deficits / Details: grossly 3-/5, limited by pain; hand-over-hand assist required to initiate and complete movement activities       Communication   Communication Communication: No apparent difficulties    Cognition Arousal: Alert Behavior During Therapy: Restless   PT - Cognitive impairments: History of cognitive impairments                       PT - Cognition Comments: Oriented to self only; intermittently follows simple commands, very highly distractible by external environment.  Constantly fidgeting with objects in environment, constant redirection required. Following commands: Impaired Following commands  impaired: Follows multi-step commands inconsistently     Cueing Cueing Techniques: Verbal cues, Gestural cues, Tactile cues     General Comments      Exercises Other Exercises Other Exercises: Toilet transfer, SPT with bilat HHA and with L PFRW, mod assist +2.  Unable to comprehend role/use of BSC; unsuccessful toileting efforts.   Assessment/Plan    PT Assessment Patient needs continued PT services  PT Problem List Decreased strength;Decreased range of motion;Decreased activity tolerance;Decreased balance;Decreased coordination;Decreased mobility;Decreased cognition;Decreased knowledge of use of DME;Decreased safety awareness;Decreased knowledge of precautions;Cardiopulmonary status limiting activity;Pain       PT Treatment Interventions DME instruction;Gait training;Stair training;Functional mobility training;Therapeutic activities;Therapeutic exercise;Balance training;Patient/family education;Cognitive remediation    PT Goals (Current goals can be found in the Care Plan section)  Acute Rehab PT Goals Patient Stated Goal: [facility] at discharge PT Goal Formulation: With patient/family Time For Goal Achievement: 11/30/23 Potential to Achieve Goals: Good    Frequency 7X/week     Co-evaluation               AM-PAC PT "6 Clicks" Mobility  Outcome Measure Help needed turning from your back to your side while in a flat bed without using bedrails?: A Lot Help needed moving from lying on your back to sitting on the side of a flat bed without using bedrails?: A Lot Help needed moving to and from a bed to a chair (  including a wheelchair)?: A Lot Help needed standing up from a chair using your arms (e.g., wheelchair or bedside chair)?: A Lot Help needed to walk in hospital room?: A Lot Help needed climbing 3-5 steps with a railing? : Total 6 Click Score: 11    End of Session Equipment Utilized During Treatment: Gait belt Activity Tolerance: Patient tolerated treatment  well Patient left: in chair;with call bell/phone within reach;with chair alarm set;with family/visitor present Nurse Communication: Mobility status PT Visit Diagnosis: Muscle weakness (generalized) (M62.81);Difficulty in walking, not elsewhere classified (R26.2)    Time: 1139-1209 PT Time Calculation (min) (ACUTE ONLY): 30 min   Charges:   PT Evaluation $PT Eval Moderate Complexity: 1 Mod   PT General Charges $$ ACUTE PT VISIT: 1 Visit         Ishmael Berkovich H. Manson Passey, PT, DPT, NCS 11/16/23, 1:44 PM [phone]

## 2023-11-16 NOTE — Progress Notes (Signed)
 Patient with hx of dementia, unraveled arm cast/dressing, took apart. Tried to reassemble what was manageable to put back in place. Notified hospitalist. Will continue to monitor.

## 2023-11-17 DIAGNOSIS — S72002A Fracture of unspecified part of neck of left femur, initial encounter for closed fracture: Secondary | ICD-10-CM | POA: Diagnosis not present

## 2023-11-17 DIAGNOSIS — E440 Moderate protein-calorie malnutrition: Secondary | ICD-10-CM

## 2023-11-17 LAB — CBC
HCT: 29.8 % — ABNORMAL LOW (ref 36.0–46.0)
Hemoglobin: 10 g/dL — ABNORMAL LOW (ref 12.0–15.0)
MCH: 32.1 pg (ref 26.0–34.0)
MCHC: 33.6 g/dL (ref 30.0–36.0)
MCV: 95.5 fL (ref 80.0–100.0)
Platelets: 122 10*3/uL — ABNORMAL LOW (ref 150–400)
RBC: 3.12 MIL/uL — ABNORMAL LOW (ref 3.87–5.11)
RDW: 12.6 % (ref 11.5–15.5)
WBC: 11.7 10*3/uL — ABNORMAL HIGH (ref 4.0–10.5)
nRBC: 0 % (ref 0.0–0.2)

## 2023-11-17 LAB — BASIC METABOLIC PANEL
Anion gap: 9 (ref 5–15)
BUN: 17 mg/dL (ref 8–23)
CO2: 26 mmol/L (ref 22–32)
Calcium: 8.4 mg/dL — ABNORMAL LOW (ref 8.9–10.3)
Chloride: 99 mmol/L (ref 98–111)
Creatinine, Ser: 0.65 mg/dL (ref 0.44–1.00)
GFR, Estimated: >60 mL/min (ref >60)
Glucose, Bld: 102 mg/dL — ABNORMAL HIGH (ref 70–99)
Potassium: 2.9 mmol/L — ABNORMAL LOW (ref 3.5–5.1)
Sodium: 134 mmol/L — ABNORMAL LOW (ref 135–145)

## 2023-11-17 LAB — URINALYSIS, COMPLETE (UACMP) WITH MICROSCOPIC
Bacteria, UA: NONE SEEN
Bilirubin Urine: NEGATIVE
Glucose, UA: NEGATIVE mg/dL
Hgb urine dipstick: NEGATIVE
Ketones, ur: 5 mg/dL — AB
Leukocytes,Ua: NEGATIVE
Nitrite: NEGATIVE
Protein, ur: NEGATIVE mg/dL
Specific Gravity, Urine: 1.018 (ref 1.005–1.030)
Squamous Epithelial / HPF: 0 /HPF (ref 0–5)
pH: 5 (ref 5.0–8.0)

## 2023-11-17 LAB — MAGNESIUM: Magnesium: 2.2 mg/dL (ref 1.7–2.4)

## 2023-11-17 MED ORDER — POTASSIUM CHLORIDE CRYS ER 20 MEQ PO TBCR
40.0000 meq | EXTENDED_RELEASE_TABLET | Freq: Two times a day (BID) | ORAL | Status: AC
Start: 2023-11-17 — End: 2023-11-17
  Administered 2023-11-17 (×2): 40 meq via ORAL
  Filled 2023-11-17 (×2): qty 2

## 2023-11-17 MED ORDER — ADULT MULTIVITAMIN W/MINERALS CH
1.0000 | ORAL_TABLET | Freq: Every day | ORAL | Status: DC
Start: 2023-11-17 — End: 2023-11-20
  Administered 2023-11-17 – 2023-11-20 (×4): 1 via ORAL
  Filled 2023-11-17 (×4): qty 1

## 2023-11-17 MED ORDER — ENSURE ENLIVE PO LIQD
237.0000 mL | Freq: Three times a day (TID) | ORAL | Status: DC
  Administered 2023-11-17 – 2023-11-20 (×9): 237 mL via ORAL

## 2023-11-17 NOTE — TOC Initial Note (Signed)
 Transition of Care [facility]) - Initial/Assessment Note    Patient Details  Name: Samantha Hanna MRN: [id number] Date of Birth: [birth year]  Transition of Care [facility]) CM/SW Contact:    Marlowe Sax, RN Phone Number: 11/17/2023, 3:13 PM  Clinical Narrative:                  Spoke with the daughter about going to STR, She would like [facility], I reached out to Pierz at [facility] and made the bed referral, PASSR is pending   Expected Discharge Plan: Skilled Nursing Facility Barriers to Discharge: Awaiting State Approval [facility])   Patient Goals and CMS Choice            Expected Discharge Plan and Services   Discharge Planning Services: CM Consult   Living arrangements for the past 2 months: Assisted Living Facility                   DME Agency: NA       HH Arranged: NA          Prior Living Arrangements/Services Living arrangements for the past 2 months: Assisted Living Facility Lives with:: Facility Resident Patient language and need for interpreter reviewed:: Yes Do you feel safe going back to the place where you live?: Yes      Need for Family Participation in Patient Care: Yes (Comment) Care giver support system in place?: Yes (comment)   Criminal Activity/Legal Involvement Pertinent to Current Situation/Hospitalization: No - Comment as needed  Activities of Daily Living      Permission Sought/Granted   Permission granted to share information with : Yes, Verbal Permission Granted              Emotional Assessment Appearance:: Appears stated age   Affect (typically observed): Pleasant Orientation: : Oriented to Self, Oriented to Place, Oriented to  Time, Oriented to Situation Alcohol / Substance Use: Not Applicable Psych Involvement: No (comment)  Admission diagnosis:  Hip fracture (HCC) [S72.009A] Closed fracture of left hip, initial encounter (HCC) [S72.002A] Fall, initial encounter [W19.XXXA] Closed fracture of left wrist, initial  encounter [S62.102A] Patient Active Problem List   Diagnosis Date Noted   Malnutrition of moderate degree 11/17/2023   Hip fracture, unspecified laterality, closed, initial encounter (HCC) 11/15/2023   Left wrist fracture, closed, initial encounter 11/15/2023   Hip fracture (HCC) 11/15/2023   Dizziness 01/24/2023   Unintentional weight loss of 10% body weight within 6 months 03/29/2022   Insomnia due to psychological stress 03/29/2022   Hyperlipidemia 01/05/2021   Sinus congestion 01/02/2021   Elevated blood-pressure reading, without diagnosis of hypertension 01/02/2021   Abnormal mammogram of left breast 04/19/2020   Lumbago without sciatica 10/20/2018   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 04/07/2017   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   Impingement syndrome of left shoulder region 02/28/2017   Cervical neck pain with evidence of disc disease 03/24/2016   Benign paroxysmal positional vertigo 01/08/2016   Carpal tunnel syndrome 01/08/2016   Grief reaction 05/14/2015   Spinal stenosis in cervical region 05/14/2015   Anxiety state 04/18/2015   Vascular dementia (HCC) 04/18/2015   Shoulder pain, left 04/07/2015   B12 deficiency 02/15/2014   Irritable bowel syndrome 09/08/2013   Preoperative evaluation to rule out surgical contraindication 07/15/2012   Hypothyroidism 07/15/2012   Statin intolerance 07/14/2012   Screening for breast cancer 07/11/2011   Screening for colon cancer 07/11/2011   PCP:  Sherlene Shams, MD Pharmacy:  [facility] - [place], [place] - [address]er [place] [address] Phone: [phone] Fax: [phone]     Social Drivers of Health (SDOH) Social History: SDOH Screenings   Food Insecurity: No Food Insecurity (11/15/2023)  Housing: Low Risk  (11/15/2023)  Transportation Needs: No Transportation Needs (11/15/2023)  Utilities: Not At Risk (11/15/2023)  Depression (PHQ2-9): Low Risk  (01/16/2023)  Financial  Resource Strain: Low Risk  (02/05/2022)  Physical Activity: Sufficiently Active (02/05/2022)  Social Connections: Socially Integrated (11/15/2023)  Stress: No Stress Concern Present (02/05/2022)  Tobacco Use: Low Risk  (11/15/2023)   SDOH Interventions:     Readmission Risk Interventions     No data to display

## 2023-11-17 NOTE — Plan of Care (Signed)

## 2023-11-17 NOTE — Progress Notes (Addendum)
 PROGRESS NOTE    Samantha Hanna  WUJ:[id number] DOB: 13-Jun-1941 DOA: 11/15/2023 PCP: Sherlene Shams, MD   Assessment & Plan:   Principal Problem:   Hip fracture [facility]) Active Problems:   Hip fracture, unspecified laterality, closed, initial encounter (HCC)   Left wrist fracture, closed, initial encounter  Assessment and Plan: Left femoral neck fracture: secondary to mechanical fall at home. S/p left hip hemiarthroplasty w/ [employer] accolade prosthesis. Tramadol, morphine prn for pain. Ortho surg following and recs apprec. PT/OT recs SNF   Left radius and distal ulnar metadiaphysis fracture: secondary to mechanical fall at home. S/p closed reduction of left wrist as per ortho surg   Acute blood loss anemia: likely secondary to recent above surg. Will transfuse if Hb < 7.0    Hypothyroidism: continue on home dose of levothyroxine   Hypokalemia: potassium given. Mg is WNL    Mild protein calorie malnutrition: continue on nutritional supplements   Thrombocytopenia: etiology unclear. Will continue to monitor   Dementia: continue w/ supportive care    DVT prophylaxis: lovenox  Code Status: full  Family Communication: discussed pt's care w/ pt's daughter, Jacki Cones, at bedside and answered her questions Disposition Plan: d/c to SNF  Level of care: Med-Surg  Status is: Inpatient Remains inpatient appropriate because: waiting on SNF placement     Consultants:  Ortho surg  Procedures:   Antimicrobials:    Subjective: Pt is pleasantly confused   Objective: Vitals:   11/16/23 1154 11/16/23 1619 11/16/23 1949 11/17/23 0414  BP: 113/60 (!) 103/56 100/73 (!) 102/52  Pulse: 78 67 61 74  Resp: 16 14    Temp: (!) 97.4 F (36.3 C) 97.6 F (36.4 C) 98.5 F (36.9 C) (!) 97.3 F (36.3 C)  TempSrc: Oral  Oral Oral  SpO2: 97% 95% 100% 99%  Weight:      Height:        Intake/Output Summary (Last 24 hours) at 11/17/2023 0833 Last data filed at 11/17/2023 0129 Gross per 24 hour   Intake 120 ml  Output 650 ml  Net -530 ml   Filed Weights   11/16/23 0331  Weight: 61.6 kg    Examination:  General exam: Appears comfortable   Respiratory system: clear breath sounds b/l  Cardiovascular system: S1/S2+. No rubs or clicks  Gastrointestinal system: abd is soft, NT, ND & hypoactive bowel sounds Central nervous system: alert & awake. Moves all extremities  Psychiatry: judgement and insight appears poor. Appropriate mood and affect     Data Reviewed: I have personally reviewed following labs and imaging studies  CBC: Recent Labs  Lab 11/15/23 0827 11/16/23 0221 11/17/23 0523  WBC 6.6 16.1* 11.7*  NEUTROABS 4.9  --   --   HGB 12.7 10.7* 10.0*  HCT 38.1 32.1* 29.8*  MCV 94.1 97.0 95.5  PLT 177 141* 122*   Basic Metabolic Panel: Recent Labs  Lab 11/15/23 0827 11/16/23 0221 11/17/23 0523  NA 138 134* 134*  K 3.6 3.4* 2.9*  CL 105 103 99  CO2 25 25 26   GLUCOSE 135* 145* 102*  BUN 15 17 17   CREATININE 0.77 0.65 0.65  CALCIUM 8.9 8.1* 8.4*   GFR: Estimated Creatinine Clearance: 54.5 mL/min (by C-G formula based on SCr of 0.65 mg/dL). Liver Function Tests: Recent Labs  Lab 11/15/23 0827  AST 20  ALT 16  ALKPHOS 60  BILITOT 0.8  PROT 6.8  ALBUMIN 4.0   No results for input(s): "LIPASE", "AMYLASE" in the last 168 hours.  No results for input(s): "AMMONIA" in the last 168 hours. Coagulation Profile: Recent Labs  Lab 11/15/23 1154  INR 1.1   Cardiac Enzymes: Recent Labs  Lab 11/15/23 0827  CKTOTAL 58   BNP (last 3 results) No results for input(s): "PROBNP" in the last 8760 hours. HbA1C: No results for input(s): "HGBA1C" in the last 72 hours. CBG: No results for input(s): "GLUCAP" in the last 168 hours. Lipid Profile: No results for input(s): "CHOL", "HDL", "LDLCALC", "TRIG", "CHOLHDL", "LDLDIRECT" in the last 72 hours. Thyroid Function Tests: Recent Labs    11/15/23 1154  TSH 1.898   Anemia Panel: No results for input(s):  "VITAMINB12", "FOLATE", "FERRITIN", "TIBC", "IRON", "RETICCTPCT" in the last 72 hours. Sepsis Labs: No results for input(s): "PROCALCITON", "LATICACIDVEN" in the last 168 hours.  No results found for this or any previous visit (from the past 240 hours).       Radiology Studies: DG HIP UNILAT W OR W/O PELVIS 2-3 VIEWS LEFT Result Date: 11/15/2023 CLINICAL DATA:  Postoperative closed left hip fracture. EXAM: DG HIP (WITH OR WITHOUT PELVIS) 2-3V LEFT COMPARISON:  11/15/2023 FINDINGS: Interval postoperative changes with placement of a left hip hemiarthroplasty. Non cemented component appears well seated. No evidence of new acute fracture or dislocation. Soft tissue gas and skin clips are consistent with recent surgery. Visualized pelvis appears intact. Degenerative changes in the right hip. IMPRESSION: Postoperative left hip replacement with components appearing well seated. No acute complication is demonstrated radiographically. Electronically Signed   By: Burman Nieves M.D.   On: 11/15/2023 22:00   DG MINI C-ARM IMAGE ONLY Result Date: 11/15/2023 There is no interpretation for this exam.  This order is for images obtained during a surgical procedure.  Please See "Surgeries" Tab for more information regarding the procedure.   CT Cervical Spine Wo Contrast Result Date: 11/15/2023 CLINICAL DATA:  83 year old female status post unwitnessed fall in bathroom. Found down. Pain. EXAM: CT CERVICAL SPINE WITHOUT CONTRAST TECHNIQUE: Multidetector CT imaging of the cervical spine was performed without intravenous contrast. Multiplanar CT image reconstructions were also generated. RADIATION DOSE REDUCTION: This exam was performed according to the departmental dose-optimization program which includes automated exposure control, adjustment of the mA and/or kV according to patient size and/or use of iterative reconstruction technique. COMPARISON:  Head CT today.  Cervical spine CT 08/13/2021. FINDINGS: Alignment:  Mildly improved cervical lordosis. Cervicothoracic junction alignment is within normal limits. Bilateral posterior element alignment is within normal limits. Skull base and vertebrae: Motion artifact although less pronounced than on the head CT today. Chronic TMJ degeneration. No skull base fracture identified. No evidence of atlanto-occipital dissociation. C1 and C2 appear aligned, grossly intact. Other cervical vertebrae appear intact. Soft tissues and spinal canal: No prevertebral fluid or swelling. No visible canal hematoma. Negative noncontrast neck soft tissues. Disc levels:  Mild for age cervical spine degeneration. Upper chest: Visible upper thoracic levels appear intact, upper lungs are clear. IMPRESSION: Mild motion artifact. No acute traumatic injury identified in the cervical spine. Electronically Signed   By: Odessa Fleming M.D.   On: 11/15/2023 09:50   DG Chest Portable 1 View Result Date: 11/15/2023 CLINICAL DATA:  83 year old female status post unwitnessed fall in bathroom. Found down. Pain. EXAM: PORTABLE CHEST 1 VIEW COMPARISON:  Chest radiographs 09/23/2016. FINDINGS: Portable AP supine view at 0919 hours. Lung volumes and mediastinal contours are within normal limits. Visualized tracheal air column is within normal limits. Allowing for portable technique the lungs are clear. No pneumothorax or pleural  effusion. No acute osseous abnormality identified. Negative visible bowel gas. IMPRESSION: No acute cardiopulmonary abnormality or acute traumatic injury identified. Electronically Signed   By: Odessa Fleming M.D.   On: 11/15/2023 09:46   DG Knee 2 Views Left Result Date: 11/15/2023 CLINICAL DATA:  83 year old female status post unwitnessed fall in bathroom. Found down. Pain. EXAM: LEFT KNEE - 1-2 VIEW COMPARISON:  None Available. FINDINGS: Two views at 0906 hours. Bone mineralization is within normal limits. Normal joint spaces and alignment for age. Patella appears intact. No evidence of joint effusion.  No acute osseous abnormality identified. IMPRESSION: No acute fracture or dislocation identified about the left knee. Electronically Signed   By: Odessa Fleming M.D.   On: 11/15/2023 09:44   DG Wrist Complete Left Result Date: 11/15/2023 CLINICAL DATA:  83 year old female status post unwitnessed fall in bathroom. Found down. Pain. EXAM: LEFT WRIST - COMPLETE 3+ VIEW COMPARISON:  None Available. FINDINGS: Four views at 0856 hours. Bone mineralization is within normal limits for age. Comminuted and dorsally impacted distal left radius fracture. DRU and radiocarpal joint involvement. Unclear whether the ulnar styloid is intact but there is a small 6 mm fracture fragment at the distal ulna metadiaphysis. Generalized soft tissue swelling. Carpal bones appear to remain intact and aligned. No metacarpal fracture identified. IMPRESSION: 1. Comminuted and dorsally impacted distal left radius fracture. 2. Small fracture fragment at the distal ulna metadiaphysis. Electronically Signed   By: Odessa Fleming M.D.   On: 11/15/2023 09:44   DG Hip Unilat W or Wo Pelvis 2-3 Views Left Result Date: 11/15/2023 CLINICAL DATA:  83 year old female status post unwitnessed fall in bathroom. Found down. Pain. EXAM: DG HIP (WITH OR WITHOUT PELVIS) 2-3V LEFT COMPARISON:  None Available. FINDINGS: Three views at 0914 hours. Left femoral neck fracture with varus impaction. The intertrochanteric segment appears to remain intact. The left femoral head appears to remain normally located. No superimposed pelvis fracture identified. Grossly intact proximal right femur. Bone mineralization is within normal limits for age. Symmetric SI joints. Nonobstructed bowel-gas pattern. IMPRESSION: Acute Left femoral neck fracture with varus impaction. Electronically Signed   By: Odessa Fleming M.D.   On: 11/15/2023 09:42   CT HEAD WO CONTRAST ( ) Result Date: 11/15/2023 CLINICAL DATA:  83 year old female status post unwitnessed fall in bathroom. Found down. Pain. EXAM: CT  HEAD WITHOUT CONTRAST TECHNIQUE: Contiguous axial images were obtained from the base of the skull through the vertex without intravenous contrast. RADIATION DOSE REDUCTION: This exam was performed according to the departmental dose-optimization program which includes automated exposure control, adjustment of the mA and/or kV according to patient size and/or use of iterative reconstruction technique. COMPARISON:  Brain MRI 05/04/2015.  Head CT 08/13/2021. FINDINGS: Brain: Motion artifact throughout the posterior fossa. No evidence of intracranial mass effect or ventriculomegaly. No acute intracranial hemorrhage identified. No cortically based acute infarct identified. Vascular: Motion artifact at the skull base. Skull: Motion artifact throughout the skull base. No acute osseous abnormality identified. Sinuses/Orbits: Motion artifact. Other: Visualized scalp soft tissues are within normal limits. IMPRESSION: Motion artifact. No acute intracranial abnormality or acute traumatic injury identified. Electronically Signed   By: Odessa Fleming M.D.   On: 11/15/2023 09:41        Scheduled Meds:  chlorhexidine   Topical Once   docusate sodium  100 mg Oral BID   enoxaparin (LOVENOX) injection  40 mg Subcutaneous Q24H   ferrous sulfate  325 mg Oral Q breakfast   gabapentin  200 mg  Oral BID   levothyroxine  75 mcg Oral QAC breakfast   LORazepam  0.5 mg Oral Daily   memantine  5 mg Oral BID   nitrofurantoin (macrocrystal-monohydrate)  100 mg Oral Daily   potassium chloride  40 mEq Oral BID   sertraline  100 mg Oral Daily   Continuous Infusions:     LOS: 2 days       Charise Killian, MD [facility] Pager [phone]  If 7PM-7AM, please contact night-coverage [email or web address] 11/17/2023, 8:33 AM

## 2023-11-17 NOTE — Progress Notes (Signed)
 Initial Nutrition Assessment  DOCUMENTATION CODES:   Non-severe (moderate) malnutrition in context of chronic illness  INTERVENTION:   -Obtain new wt -MVI with minerals daily -Ensure Enlive po TID, each supplement provides 350 kcal and 20 grams of protein  -Magic cup BID with meals, each supplement provides 290 kcal and 9 grams of protein  -Feeding assistance with meals  NUTRITION DIAGNOSIS:   Moderate Malnutrition related to chronic illness (dementia) as evidenced by mild fat depletion, moderate fat depletion, moderate muscle depletion, severe muscle depletion.  GOAL:   Patient will meet greater than or equal to 90% of their needs  MONITOR:   PO intake, Supplement acceptance  REASON FOR ASSESSMENT:   Consult Assessment of nutrition requirement/status, Calorie Count  ASSESSMENT:   Pt with medical history significant of dementia, hypothyroidism, arthritis, was found on the floor after a unwitnessed fall.  Pt admitted with lt hip fracture and lt radius and distal ulnar metadiaphysis fracture.   3/22- s/p PROCEDURE: Left   hip hemiarthroplasty with [employer] Accolade prosthesis; PROCEDURE:  CLOSED REDUCTION, WRIST left with sugar-tong splint   Reviewed I/O's: -1.5 L x 24 hours and -2.3 L since admission  UOP: 1.6 L x 24 hours  Per chart review, pt is from [facility].   Pt sitting up in bed at time of visit. Pt confused at time of visit and unable to provide accurate history. Pt shares "it's been a bust day, doing all of my errands".   Observed yogurt that was completed on tray table. Pt currently on a regular diet. Noted meal completions 25%.   Reviewed wt hx; current wt identical from 01/16/23 encounter. RD will obtain new wt to better assess weight changes (suspect this is a stated weight instead of a measured wt).   RD will add supplements and feed assistance to help optimize oral intake.   Medications reviewed and include colace, ferrous sulfate,  neurontin, ativan, and potassium chloride.   Per orthopedics notes, plan for SNF at discharge.   Labs reviewed: Na: 134, K: 2.9.  NUTRITION - FOCUSED PHYSICAL EXAM:  Flowsheet Row Most Recent Value  Orbital Region Moderate depletion  Upper Arm Region Moderate depletion  Thoracic and Lumbar Region Mild depletion  Buccal Region Moderate depletion  Temple Region Moderate depletion  Clavicle Bone Region Severe depletion  Clavicle and Acromion Bone Region Severe depletion  Scapular Bone Region Severe depletion  Dorsal Hand Severe depletion  Patellar Region Moderate depletion  Anterior Thigh Region Moderate depletion  Posterior Calf Region Moderate depletion  Edema (RD Assessment) None  Hair Reviewed  Eyes Reviewed  Mouth Reviewed  Skin Reviewed  Nails Reviewed       Diet Order:   Diet Order             Diet regular Room service appropriate? Yes; Fluid consistency: Thin  Diet effective now                   EDUCATION NEEDS:   No education needs have been identified at this time  Skin:  Skin Assessment: Skin Integrity Issues: Skin Integrity Issues:: Incisions Incisions: closed lt hip, closed rt arm  Last BM:  11/14/23  Height:   Ht Readings from Last 1 Encounters:  11/16/23 5\' 7"  (1.702 m)    Weight:   Wt Readings from Last 1 Encounters:  11/16/23 61.6 kg    Ideal Body Weight:  61.4 kg  BMI:  Body mass index is 21.27 kg/m.  Estimated Nutritional  Needs:   Kcal:  1650-1850  Protein:  85-100 grams  Fluid:  > 1.6 L    Levada Schilling, RD, LDN, CDCES Registered Dietitian III Certified Diabetes Care and Education Specialist If unable to reach this RD, please use "RD Inpatient" group chat on secure chat between hours of 8am-4 pm daily

## 2023-11-17 NOTE — Progress Notes (Signed)
 Subjective: 2 Days Post-Op Procedure(s) (LRB): HEMIARTHROPLASTY (BIPOLAR) HIP, POSTERIOR APPROACH FOR FRACTURE (Left) CLOSED REDUCTION, WRIST (Left) The patient is out of bed in a chair sleeping at the moment.  She ate a good lunch.  She has been very fidgety still.  The hip is well aligned and stable.  Dressing is dry.  Hemoglobin remained stable.  She will need skilled nursing placement.  Patient reports pain as mild.  Objective:   VITALS:   Vitals:   11/17/23 0848 11/17/23 0908  BP: (!) 96/43 (!) 101/58  Pulse: 81 79  Resp: 16 17  Temp: 98.2 F (36.8 C) 98.3 F (36.8 C)  SpO2: 100% 100%    Neurologically intact Neurovascular intact Incision: dressing C/D/I  LABS Recent Labs    11/15/23 0827 11/16/23 0221 11/17/23 0523  HGB 12.7 10.7* 10.0*  HCT 38.1 32.1* 29.8*  WBC 6.6 16.1* 11.7*  PLT 177 141* 122*    Recent Labs    11/15/23 0827 11/16/23 0221 11/17/23 0523  NA 138 134* 134*  K 3.6 3.4* 2.9*  BUN 15 17 17   CREATININE 0.77 0.65 0.65  GLUCOSE 135* 145* 102*    Recent Labs    11/15/23 1154  INR 1.1     Assessment/Plan: 2 Days Post-Op Procedure(s) (LRB): HEMIARTHROPLASTY (BIPOLAR) HIP, POSTERIOR APPROACH FOR FRACTURE (Left) CLOSED REDUCTION, WRIST (Left)   Advance diet Up with therapy Discharge to SNF when appropriate. For to clinic 2 weeks after discharge for x-rays 81 mg ASA twice daily for DVT prophylaxis on discharge for 8 weeks

## 2023-11-17 NOTE — Progress Notes (Signed)
 Physical Therapy Treatment Patient Details Name: Samantha Hanna MRN: [id number] DOB: 1941/07/25 Today's Date: 11/17/2023   History of Present Illness presented to ER after unwitnessed fall at memory care facility, acute onset of L hip and L wrist pain; admitted for management of displaced subcaptical fracture of L hip, L distal radial/ulnar fracture s/p L hip hemiarthroplasty (posterior approach, WBAT) and closed reduction of L wrist (NWB) 11/15/23.    PT Comments  Max assist +2 for all mobility this date, limited by increased fatigue/lethargy (pain medication?).  Does open eyes, acknowledge therapist with direct stimulation, but difficulty maintaining alertness for full and optimal participation with session. Continues to require hand-over-hand assist and manual facilitation of all functional tasks due to baseline cognitive deficits.     If plan is discharge home, recommend the following: Two people to help with walking and/or transfers;Two people to help with bathing/dressing/bathroom   Can travel by private vehicle     No  Equipment Recommendations       Recommendations for Other Services       Precautions / Restrictions Precautions Precautions: Fall Required Braces or Orthoses: Splint/Cast Splint/Cast: L UE Restrictions Weight Bearing Restrictions Per Provider Order: Yes LUE Weight Bearing Per Provider Order: Non weight bearing LLE Weight Bearing Per Provider Order: Weight bearing as tolerated     Mobility  Bed Mobility Overal bed mobility: Needs Assistance Bed Mobility: Supine to Sit     Supine to sit: Max assist, +2 for physical assistance     General bed mobility comments: step by step multi modal cues; hand-over-hand to initiate and complete all mobility components    Transfers Overall transfer level: Needs assistance Equipment used: Left platform walker Transfers: Sit to/from Stand, Bed to chair/wheelchair/BSC Sit to Stand: Mod assist, Max assist, +2 physical  assistance           General transfer comment: hand-over-hand to initiate and complete all mobility components; dependant management/advancement of RW    Ambulation/Gait               General Gait Details: unsafe/unable this date   Stairs             Wheelchair Mobility     Tilt Bed    Modified Rankin (Stroke Patients Only)       Balance Overall balance assessment: Needs assistance Sitting-balance support: No upper extremity supported, Feet supported Sitting balance-Leahy Scale: Poor   Postural control: Posterior lean Standing balance support: Bilateral upper extremity supported Standing balance-Leahy Scale: Zero                              Communication    Cognition Arousal: Lethargic Behavior During Therapy: Restless   PT - Cognitive impairments: History of cognitive impairments                       PT - Cognition Comments: Notably lethargic during session, difficulty maintaining alertness and eyes open throughout session despite max cuing (does respond) Following commands: Impaired Following commands impaired: Only follows one step commands consistently    Cueing Cueing Techniques: Verbal cues, Gestural cues, Tactile cues  Exercises      General Comments        Pertinent Vitals/Pain Pain Assessment Pain Assessment: Faces Faces Pain Scale: Hurts even more Pain Location: L hip Pain Descriptors / Indicators: Aching, Grimacing Pain Intervention(s): Limited activity within patient's tolerance, Monitored during session, Premedicated before session, Repositioned  Home Living                          Prior Function            PT Goals (current goals can now be found in the care plan section) Acute Rehab PT Goals Patient Stated Goal: [facility] at discharge PT Goal Formulation: With patient/family Time For Goal Achievement: 11/30/23 Potential to Achieve Goals: Good Progress towards PT goals: Not  progressing toward goals - comment    Frequency    7X/week      PT Plan      Co-evaluation              AM-PAC PT "6 Clicks" Mobility   Outcome Measure  Help needed turning from your back to your side while in a flat bed without using bedrails?: A Lot Help needed moving from lying on your back to sitting on the side of a flat bed without using bedrails?: A Lot Help needed moving to and from a bed to a chair (including a wheelchair)?: A Lot Help needed standing up from a chair using your arms (e.g., wheelchair or bedside chair)?: A Lot Help needed to walk in hospital room?: A Lot Help needed climbing 3-5 steps with a railing? : Total 6 Click Score: 11    End of Session Equipment Utilized During Treatment: Gait belt Activity Tolerance: Patient limited by lethargy;Patient limited by pain Patient left: in chair;with call bell/phone within reach;with chair alarm set;with family/visitor present Nurse Communication: Mobility status PT Visit Diagnosis: Muscle weakness (generalized) (M62.81);Difficulty in walking, not elsewhere classified (R26.2)     Time: 8119-1478 PT Time Calculation (min) (ACUTE ONLY): 31 min  Charges:    $Therapeutic Activity: 23-37 mins PT General Charges $$ ACUTE PT VISIT: 1 Visit                     Brie Eppard H. Manson Passey, PT, DPT, NCS 11/17/23, 4:28 PM [phone]

## 2023-11-17 NOTE — TOC Progression Note (Signed)
 Transition of Care [facility]) - Progression Note    Patient Details  Name: Samantha Hanna MRN: [id number] Date of Birth: April 26, 1941  [facility]) CM/SW Contact  Marlowe Sax, RN Phone Number: 11/17/2023, 1:23 PM  Clinical Narrative:     The patient resides at [facility], the recommendation is to go to STR, I called her daughter  Jacki Cones at [phone] and left a HIPAA complaint VM asking for a call back      Expected Discharge Plan and Services                                               Social Determinants of Health (SDOH) Interventions SDOH Screenings   Food Insecurity: No Food Insecurity (11/15/2023)  Housing: Low Risk  (11/15/2023)  Transportation Needs: No Transportation Needs (11/15/2023)  Utilities: Not At Risk (11/15/2023)  Depression (PHQ2-9): Low Risk  (01/16/2023)  Financial Resource Strain: Low Risk  (02/05/2022)  Physical Activity: Sufficiently Active (02/05/2022)  Social Connections: Socially Integrated (11/15/2023)  Stress: No Stress Concern Present (02/05/2022)  Tobacco Use: Low Risk  (11/15/2023)    Readmission Risk Interventions     No data to display

## 2023-11-17 NOTE — NC FL2 (Signed)
 [place] MEDICAID FL2 LEVEL OF CARE FORM     IDENTIFICATION  Patient Name: Samantha Hanna Birthdate: [birth year] Sex: female Admission Date (Current Location): 11/15/2023  [facility] and [place] Number:  Chiropodist and Address:  [facility], [address], [place], [place] [address]      Provider Number: [id number]  Attending Physician Name and Address:  Charise Killian, MD  Relative Name and Phone Number:  Felicity Coyer  Daughter  Emergency Contact  [phone]    Current Level of Care: Hospital Recommended Level of Care: Skilled Nursing Facility Prior Approval Number:    Date Approved/Denied:   PASRR Number: Pending  Discharge Plan: SNF    Current Diagnoses: Patient Active Problem List   Diagnosis Date Noted   Malnutrition of moderate degree 11/17/2023   Hip fracture, unspecified laterality, closed, initial encounter (HCC) 11/15/2023   Left wrist fracture, closed, initial encounter 11/15/2023   Hip fracture (HCC) 11/15/2023   Dizziness 01/24/2023   Unintentional weight loss of 10% body weight within 6 months 03/29/2022   Insomnia due to psychological stress 03/29/2022   Hyperlipidemia 01/05/2021   Sinus congestion 01/02/2021   Elevated blood-pressure reading, without diagnosis of hypertension 01/02/2021   Abnormal mammogram of left breast 04/19/2020   Lumbago without sciatica 10/20/2018   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 04/07/2017   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   Impingement syndrome of left shoulder region 02/28/2017   Cervical neck pain with evidence of disc disease 03/24/2016   Benign paroxysmal positional vertigo 01/08/2016   Carpal tunnel syndrome 01/08/2016   Grief reaction 05/14/2015   Spinal stenosis in cervical region 05/14/2015   Anxiety state 04/18/2015   Vascular dementia (HCC) 04/18/2015   Shoulder pain, left 04/07/2015   B12 deficiency 02/15/2014   Irritable bowel syndrome 09/08/2013    Preoperative evaluation to rule out surgical contraindication 07/15/2012   Hypothyroidism 07/15/2012   Statin intolerance 07/14/2012   Screening for breast cancer 07/11/2011   Screening for colon cancer 07/11/2011    Orientation RESPIRATION BLADDER Height & Weight     Self, Situation  Normal External catheter Weight: 61.6 kg Height:  5\' 7"  (170.2 cm)  BEHAVIORAL SYMPTOMS/MOOD NEUROLOGICAL BOWEL NUTRITION STATUS      Incontinent Diet (see dc summary)  AMBULATORY STATUS COMMUNICATION OF NEEDS Skin   Extensive Assist Verbally Normal, Surgical wounds                       Personal Care Assistance Level of Assistance  Bathing, Feeding, Dressing Bathing Assistance: Maximum assistance Feeding assistance: Maximum assistance Dressing Assistance: Maximum assistance     Functional Limitations Info  Sight, Hearing, Speech Sight Info: Adequate Hearing Info: Adequate Speech Info: Adequate    SPECIAL CARE FACTORS FREQUENCY  PT (By licensed PT), OT (By licensed OT)     PT Frequency: 5 times per week OT Frequency: 5 times per week            Contractures Contractures Info: Not present    Additional Factors Info  Code Status, Allergies Code Status Info: full code Allergies Info: Hydrocodone, Shellfish Allergy, Statins, Penicillins           Current Medications (11/17/2023):  This is the current hospital active medication list Current Facility-Administered Medications  Medication Dose Route Frequency Provider Last Rate Last Admin   acetaminophen (TYLENOL) tablet 325-650 mg  325-650 mg Oral Q6H PRN Deeann Saint, MD       alum &  mag hydroxide-simeth (MAALOX/MYLANTA) 200-200-20 MG/5ML suspension 30 mL  30 mL Oral Q4H PRN Deeann Saint, MD       bisacodyl (DULCOLAX) suppository 10 mg  10 mg Rectal Daily PRN Deeann Saint, MD       chlorhexidine (HIBICLENS) 4 % liquid   Topical Once Deeann Saint, MD       docusate sodium (COLACE) capsule 100 mg  100 mg Oral BID Deeann Saint, MD   100 mg at 11/17/23 0853   enoxaparin (LOVENOX) injection 40 mg  40 mg Subcutaneous Q24H Deeann Saint, MD   40 mg at 11/17/23 0853   feeding supplement (ENSURE ENLIVE / ENSURE PLUS) liquid 237 mL  237 mL Oral TID BM Charise Killian, MD   237 mL at 11/17/23 1459   ferrous sulfate tablet 325 mg  325 mg Oral Q breakfast Deeann Saint, MD   325 mg at 11/17/23 0854   gabapentin (NEURONTIN) capsule 200 mg  200 mg Oral BID Deeann Saint, MD   200 mg at 11/17/23 1610   HYDROmorphone (DILAUDID) injection 0.5 mg  0.5 mg Intravenous Q2H PRN Deeann Saint, MD   0.5 mg at 11/16/23 1520   levothyroxine (SYNTHROID) tablet 75 mcg  75 mcg Oral QAC breakfast Deeann Saint, MD   75 mcg at 11/17/23 9604   LORazepam (ATIVAN) tablet 0.5 mg  0.5 mg Oral Daily Deeann Saint, MD   0.5 mg at 11/17/23 0853   memantine (NAMENDA) tablet 5 mg  5 mg Oral BID Deeann Saint, MD   5 mg at 11/17/23 5409   menthol-cetylpyridinium (CEPACOL) lozenge 3 mg  1 lozenge Oral PRN Deeann Saint, MD       Or   phenol (CHLORASEPTIC) mouth spray 1 spray  1 spray Mouth/Throat PRN Deeann Saint, MD       methocarbamol (ROBAXIN) tablet 500 mg  500 mg Oral Q6H PRN Deeann Saint, MD       Or   methocarbamol (ROBAXIN) injection 500 mg  500 mg Intravenous Q6H PRN Deeann Saint, MD       metoCLOPramide (REGLAN) tablet 5-10 mg  5-10 mg Oral Q8H PRN Deeann Saint, MD       Or   metoCLOPramide (REGLAN) injection 5-10 mg  5-10 mg Intravenous Q8H PRN Deeann Saint, MD       morphine (PF) 2 MG/ML injection 0.5-1 mg  0.5-1 mg Intravenous Q2H PRN Deeann Saint, MD       multivitamin with minerals tablet 1 tablet  1 tablet Oral Daily Charise Killian, MD   1 tablet at 11/17/23 1459   nitrofurantoin (macrocrystal-monohydrate) (MACROBID) capsule 100 mg  100 mg Oral Daily Deeann Saint, MD   100 mg at 11/17/23 0855   ondansetron (ZOFRAN) injection 4 mg  4 mg Intravenous Q6H PRN Deeann Saint, MD       polyethylene glycol  (MIRALAX / GLYCOLAX) packet 17 g  17 g Oral Daily PRN Deeann Saint, MD   17 g at 11/17/23 1051   potassium chloride SA (KLOR-CON M) CR tablet 40 mEq  40 mEq Oral BID Charise Killian, MD   40 mEq at 11/17/23 0854   sertraline (ZOLOFT) tablet 100 mg  100 mg Oral Daily Deeann Saint, MD   100 mg at 11/17/23 0854   sodium phosphate (FLEET) enema 1 enema  1 enema Rectal Once PRN Deeann Saint, MD       traMADol Janean Sark) tablet 50 mg  50 mg Oral Q6H PRN Deeann Saint, MD  50 mg at 11/17/23 1050   zolpidem (AMBIEN) tablet 5 mg  5 mg Oral QHS PRN Deeann Saint, MD         Discharge Medications: Please see discharge summary for a list of discharge medications.  Relevant Imaging Results:  Relevant Lab Results:   Additional Information SS# [id number]  Marlowe Sax, RN

## 2023-11-17 NOTE — Progress Notes (Signed)
 The above named patient is recommended to go to Short Term Rehab for strengthening and gait training for balance.  It is expected that the Short Term Rehab stay will be less than 30 days.  The patient is expected to return home after Rehab.

## 2023-11-18 ENCOUNTER — Encounter: Payer: Self-pay | Admitting: Specialist

## 2023-11-18 DIAGNOSIS — S72002A Fracture of unspecified part of neck of left femur, initial encounter for closed fracture: Secondary | ICD-10-CM | POA: Diagnosis not present

## 2023-11-18 LAB — BASIC METABOLIC PANEL
Anion gap: 6 (ref 5–15)
BUN: 21 mg/dL (ref 8–23)
CO2: 25 mmol/L (ref 22–32)
Calcium: 8.3 mg/dL — ABNORMAL LOW (ref 8.9–10.3)
Chloride: 102 mmol/L (ref 98–111)
Creatinine, Ser: 0.56 mg/dL (ref 0.44–1.00)
GFR, Estimated: >60 mL/min (ref >60)
Glucose, Bld: 133 mg/dL — ABNORMAL HIGH (ref 70–99)
Potassium: 4.4 mmol/L (ref 3.5–5.1)
Sodium: 133 mmol/L — ABNORMAL LOW (ref 135–145)

## 2023-11-18 LAB — CBC
HCT: 27.3 % — ABNORMAL LOW (ref 36.0–46.0)
Hemoglobin: 9.4 g/dL — ABNORMAL LOW (ref 12.0–15.0)
MCH: 32.4 pg (ref 26.0–34.0)
MCHC: 34.4 g/dL (ref 30.0–36.0)
MCV: 94.1 fL (ref 80.0–100.0)
Platelets: 128 10*3/uL — ABNORMAL LOW (ref 150–400)
RBC: 2.9 MIL/uL — ABNORMAL LOW (ref 3.87–5.11)
RDW: 12.9 % (ref 11.5–15.5)
WBC: 10.1 10*3/uL (ref 4.0–10.5)
nRBC: 0 % (ref 0.0–0.2)

## 2023-11-18 LAB — GLUCOSE, CAPILLARY: Glucose-Capillary: 113 mg/dL — ABNORMAL HIGH (ref 70–99)

## 2023-11-18 LAB — MRSA NEXT GEN BY PCR, NASAL: MRSA by PCR Next Gen: NOT DETECTED

## 2023-11-18 MED ORDER — CHLORHEXIDINE GLUCONATE CLOTH 2 % EX PADS
6.0000 | MEDICATED_PAD | Freq: Every day | CUTANEOUS | Status: DC
  Administered 2023-11-18 – 2023-11-20 (×3): 6 via TOPICAL

## 2023-11-18 MED ORDER — SODIUM CHLORIDE 0.9 % IV SOLN: INTRAVENOUS | Status: DC

## 2023-11-18 NOTE — TOC Progression Note (Signed)
 Transition of Care [facility]) - Progression Note    Patient Details  Name: Samantha Hanna MRN: [id number] Date of Birth: May 20, 1941  Transition of Care [facility]) CM/SW Contact  Marlowe Sax, RN Phone Number: 11/18/2023, 10:56 AM  Clinical Narrative:    Uploaded the requested documents to [facility] MUST   Expected Discharge Plan: Skilled Nursing Facility Barriers to Discharge: Awaiting State Approval [facility])  Expected Discharge Plan and Services   Discharge Planning Services: CM Consult   Living arrangements for the past 2 months: Assisted Living Facility                   DME Agency: NA       HH Arranged: NA           Social Determinants of Health (SDOH) Interventions SDOH Screenings   Food Insecurity: No Food Insecurity (11/15/2023)  Housing: Low Risk  (11/15/2023)  Transportation Needs: No Transportation Needs (11/15/2023)  Utilities: Not At Risk (11/15/2023)  Depression (PHQ2-9): Low Risk  (01/16/2023)  Financial Resource Strain: Low Risk  (02/05/2022)  Physical Activity: Sufficiently Active (02/05/2022)  Social Connections: Socially Integrated (11/15/2023)  Stress: No Stress Concern Present (02/05/2022)  Tobacco Use: Low Risk  (11/15/2023)    Readmission Risk Interventions     No data to display

## 2023-11-18 NOTE — Care Management Important Message (Signed)
 Important Message  Patient Details  Name: [name] MRN: [id number] Date of Birth: October 15, 1940   Important Message Given:  Yes - Medicare IM     Cristela Blue, CMA 11/18/2023, 12:16 PM

## 2023-11-18 NOTE — Progress Notes (Signed)
 PROGRESS NOTE   HPI was taken from Dr. Chipper Herb: Samantha Hanna is a 83 y.o. female with medical history significant of dementia, hypothyroidism, arthritis, was found on the floor after a unwitnessed fall.   Patient has had worsening of generalized weakness for last few months and sustained a fall this morning " knees give up" and hit her left side.  Excruciating pain associated with left wrist and left hip and unable to get up again by herself.  Denied any prodromes of lightheadedness chest pain shortness of breath blurry vision before or after the fall.  Patient denied any recent diarrhea, no dysuria no cough. ED Course: Afebrile, blood pressure 159/82, heart rate 68 x-ray showed negative CT head and neck study for acute intracranial findings were fracture with dislocation or subluxation, hip image showed left femoral neck fracture, left wrist x-ray showed left radius fracture and left distal ulnar metadiaphysis fracture.  CBC BMP largely normal   ADIA CRAMMER  ZOX:[id number] DOB: 08-31-1940 DOA: 11/15/2023 PCP: Sherlene Shams, MD   Assessment & Plan:   Principal Problem:   Hip fracture [facility]) Active Problems:   Hip fracture, unspecified laterality, closed, initial encounter (HCC)   Left wrist fracture, closed, initial encounter   Malnutrition of moderate degree  Assessment and Plan: Left femoral neck fracture: secondary to mechanical fall at home. S/p left hip hemiarthroplasty w/ [employer] accolade prosthesis. Tramadol, morphine prn pain. PT/OT recs SNF.  Left radius and distal ulnar metadiaphysis fracture: secondary to mechanical fall at home. S/p closed reduction of left wrist as per ortho surg   Urinary retention: foley placed 11/17/23. Voiding trial prior to d/c to SNF.   Acute blood loss anemia: likely secondary to recent above surg. No need for a transfusion currently    Hypothyroidism: continue on home dose of levothyroxine   Hypokalemia: WNL today     Mild protein calorie  malnutrition: continue on po supplements  Thrombocytopenia: etiology unclear. Labile. Will continue to monitor   Dementia: continue w/ supportive care    DVT prophylaxis: lovenox  Code Status: full  Family Communication: discussed pt's care w/ pt's daughter, Jacki Cones, at bedside and answered her questions Disposition Plan: d/c to SNF  Level of care: Med-Surg  Status is: Inpatient Remains inpatient appropriate because: medically stable. Waiting on PASRR as per CM     Consultants:  Ortho surg  Procedures:   Antimicrobials:    Subjective: Pt is pleasantly confused   Objective: Vitals:   11/17/23 2232 11/17/23 2243 11/18/23 0420 11/18/23 0725  BP: (!) 121/114 (!) 120/54 112/64 115/72  Pulse:   78 74  Resp:   16 16  Temp:   98.3 F (36.8 C) 98.4 F (36.9 C)  TempSrc:    Oral  SpO2:   96% 97%  Weight:      Height:        Intake/Output Summary (Last 24 hours) at 11/18/2023 0842 Last data filed at 11/18/2023 0606 Gross per 24 hour  Intake 480 ml  Output 700 ml  Net -220 ml   Filed Weights   11/16/23 0331 11/17/23 1514  Weight: 61.6 kg 57 kg    Examination:  General exam: appears calm & comfortable    Respiratory system: clear breath sounds b/l  Cardiovascular system: S1 & S2+. No rubs or clicks Gastrointestinal system: abd is soft, NT, ND & normal bowel sounds  Central nervous system: alert & awake. Moves all extremities  Psychiatry: judgement and insight appears poor. Flat mood and affect  Data Reviewed: I have personally reviewed following labs and imaging studies  CBC: Recent Labs  Lab 11/15/23 0827 11/16/23 0221 11/17/23 0523 11/18/23 0445  WBC 6.6 16.1* 11.7* 10.1  NEUTROABS 4.9  --   --   --   HGB 12.7 10.7* 10.0* 9.4*  HCT 38.1 32.1* 29.8* 27.3*  MCV 94.1 97.0 95.5 94.1  PLT 177 141* 122* 128*   Basic Metabolic Panel: Recent Labs  Lab 11/15/23 0827 11/16/23 0221 11/17/23 0523 11/18/23 0445  NA 138 134* 134* 133*  K 3.6 3.4* 2.9*  4.4  CL 105 103 99 102  CO2 25 25 26 25   GLUCOSE 135* 145* 102* 133*  BUN 15 17 17 21   CREATININE 0.77 0.65 0.65 0.56  CALCIUM 8.9 8.1* 8.4* 8.3*  MG  --   --  2.2  --    GFR: Estimated Creatinine Clearance: 50.5 mL/min (by C-G formula based on SCr of 0.56 mg/dL). Liver Function Tests: Recent Labs  Lab 11/15/23 0827  AST 20  ALT 16  ALKPHOS 60  BILITOT 0.8  PROT 6.8  ALBUMIN 4.0   No results for input(s): "LIPASE", "AMYLASE" in the last 168 hours. No results for input(s): "AMMONIA" in the last 168 hours. Coagulation Profile: Recent Labs  Lab 11/15/23 1154  INR 1.1   Cardiac Enzymes: Recent Labs  Lab 11/15/23 0827  CKTOTAL 58   BNP (last 3 results) No results for input(s): "PROBNP" in the last 8760 hours. HbA1C: No results for input(s): "HGBA1C" in the last 72 hours. CBG: Recent Labs  Lab 11/18/23 0743  GLUCAP 113*   Lipid Profile: No results for input(s): "CHOL", "HDL", "LDLCALC", "TRIG", "CHOLHDL", "LDLDIRECT" in the last 72 hours. Thyroid Function Tests: Recent Labs    11/15/23 1154  TSH 1.898   Anemia Panel: No results for input(s): "VITAMINB12", "FOLATE", "FERRITIN", "TIBC", "IRON", "RETICCTPCT" in the last 72 hours. Sepsis Labs: No results for input(s): "PROCALCITON", "LATICACIDVEN" in the last 168 hours.  Recent Results (from the past 240 hours)  MRSA Next Gen by PCR, Nasal     Status: None   Collection Time: 11/18/23  2:59 AM   Specimen: Nasal Mucosa; Nasal Swab  Result Value Ref Range Status   MRSA by PCR Next Gen NOT DETECTED NOT DETECTED Final    Comment: (NOTE) The GeneXpert MRSA Assay (FDA approved for NASAL specimens only), is one component of a comprehensive MRSA colonization surveillance program. It is not intended to diagnose MRSA infection nor to guide or monitor treatment for MRSA infections. Test performance is not FDA approved in patients less than 75 years old. Performed at [facility], [address]  [place], [place] [address]          Radiology Studies: No results found.       Scheduled Meds:  chlorhexidine   Topical Once   Chlorhexidine Gluconate Cloth  6 each Topical Q0600   docusate sodium  100 mg Oral BID   enoxaparin (LOVENOX) injection  40 mg Subcutaneous Q24H   feeding supplement  237 mL Oral TID BM   ferrous sulfate  325 mg Oral Q breakfast   gabapentin  200 mg Oral BID   levothyroxine  75 mcg Oral QAC breakfast   LORazepam  0.5 mg Oral Daily   memantine  5 mg Oral BID   multivitamin with minerals  1 tablet Oral Daily   nitrofurantoin (macrocrystal-monohydrate)  100 mg Oral Daily   sertraline  100 mg Oral Daily   Continuous Infusions:  sodium chloride 75 mL/hr at 11/18/23 0835      LOS: 3 days       Charise Killian, MD [facility] Pager [phone]  If 7PM-7AM, please contact night-coverage [email or web address] 11/18/2023, 8:42 AM

## 2023-11-18 NOTE — Progress Notes (Signed)
 Occupational Therapy Treatment Patient Details Name: Samantha Hanna MRN: [id number] DOB: [birth year] Today's Date: 11/18/2023   History of present illness presented to ER after unwitnessed fall at memory care facility, acute onset of L hip and L wrist pain; admitted for management of displaced subcaptical fracture of L hip, L distal radial/ulnar fracture s/p L hip hemiarthroplasty (posterior approach, WBAT) and closed reduction of L wrist (NWB) 11/15/23.   OT comments  Samantha Hanna was seen for OT treatment on this date. Upon arrival to room pt reclined in chair, appears to be fallng asleep, daughter at bedside agreeable to tx. Pt requires MAX A squat pivot chair>bed t/f. TOTAL A sit>sup. Lethargy limiting further ADL participation. Reviewed posterior hip pcns with daughter. Pt making progress toward goals, will continue to follow POC. Discharge recommendation remains appropriate.       If plan is discharge home, recommend the following:  A lot of help with walking and/or transfers;A lot of help with bathing/dressing/bathroom;Supervision due to cognitive status   Equipment Recommendations  Other (comment) (defer)    Recommendations for Other Services      Precautions / Restrictions Precautions Precautions: Fall Recall of Precautions/Restrictions: Impaired Required Braces or Orthoses: Splint/Cast Splint/Cast: L UE Restrictions Weight Bearing Restrictions Per Provider Order: Yes LUE Weight Bearing Per Provider Order: Non weight bearing LLE Weight Bearing Per Provider Order: Weight bearing as tolerated       Mobility Bed Mobility Overal bed mobility: Needs Assistance Bed Mobility: Sit to Supine       Sit to supine: Total assist        Transfers Overall transfer level: Needs assistance   Transfers: Bed to chair/wheelchair/BSC     Squat pivot transfers: Max assist             Balance Overall balance assessment: Needs assistance Sitting-balance support: No upper extremity  supported Sitting balance-Leahy Scale: Poor     Standing balance support: Single extremity supported, During functional activity Standing balance-Leahy Scale: Zero                             ADL either performed or assessed with clinical judgement   ADL Overall ADL's : Needs assistance/impaired                                       General ADL Comments: MAX A for simulated BSC t/f     Communication Communication Communication: Impaired Factors Affecting Communication: Other (comment) (Severe dementia)   Cognition Arousal: Lethargic Behavior During Therapy: Flat affect, Anxious Cognition: History of cognitive impairments, Cognition impaired                               Following commands: Impaired Following commands impaired: Follows one step commands inconsistently      Cueing   Cueing Techniques: Verbal cues             Pertinent Vitals/ Pain       Pain Assessment Pain Assessment: PAINAD Breathing: occasional labored breathing, short period of hyperventilation Negative Vocalization: occasional moan/groan, low speech, negative/disapproving quality Facial Expression: sad, frightened, frown Body Language: relaxed Consolability: distracted or reassured by voice/touch PAINAD Score: 4 Pain Location: L hip Pain Descriptors / Indicators: Aching, Grimacing Pain Intervention(s): Limited activity within patient's tolerance, Repositioned   Frequency  Min  2X/week        Progress Toward Goals  OT Goals(current goals can now be found in the care plan section)  Progress towards OT goals: Progressing toward goals  Acute Rehab OT Goals OT Goal Formulation: With patient/family Time For Goal Achievement: 11/30/23 Potential to Achieve Goals: Good ADL Goals Pt Will Perform Grooming: with supervision;sitting Pt Will Perform Lower Body Dressing: with mod assist;sitting/lateral leans;sit to/from stand Pt Will Transfer to Toilet:  with min assist;ambulating Pt Will Perform Toileting - Clothing Manipulation and hygiene: with mod assist;sitting/lateral leans;sit to/from stand  Plan      Co-evaluation                 AM-PAC OT "6 Clicks" Daily Activity     Outcome Measure   Help from another person eating meals?: A Little Help from another person taking care of personal grooming?: A Little Help from another person toileting, which includes using toliet, bedpan, or urinal?: A Lot Help from another person bathing (including washing, rinsing, drying)?: A Lot Help from another person to put on and taking off regular upper body clothing?: A Lot Help from another person to put on and taking off regular lower body clothing?: A Lot 6 Click Score: 14    End of Session Equipment Utilized During Treatment: Gait belt  OT Visit Diagnosis: Other abnormalities of gait and mobility (R26.89);Muscle weakness (generalized) (M62.81)   Activity Tolerance Patient tolerated treatment well   Patient Left in bed;with call bell/phone within reach;with bed alarm set;with family/visitor present   Nurse Communication          Time: 1308-6578 OT Time Calculation (min): 16 min  Charges: OT General Charges $OT Visit: 1 Visit OT Treatments $Self Care/Home Management : 8-22 mins  Kathie Dike, M.S. OTR/L  11/18/23, 10:19 AM  ascom [phone]

## 2023-11-18 NOTE — Progress Notes (Signed)
 Physical Therapy Treatment Patient Details Name: Samantha Hanna MRN: [id number] DOB: April 22, 1941 Today's Date: 11/18/2023   History of Present Illness presented to ER after unwitnessed fall at memory care facility, acute onset of L hip and L wrist pain; admitted for management of displaced subcaptical fracture of L hip, L distal radial/ulnar fracture s/p L hip hemiarthroplasty (posterior approach, WBAT) and closed reduction of L wrist (NWB) 11/15/23.    PT Comments  Pt was long sitting in bed with RN about to give morning med. Author Max assisted pt to EOB with RN feeding medications in short sitting. Pt is severely confused and disoriented. Has baseline cognition deficits. She is anxious ( tremulous) and fidgeting. Pt was able to stand 3 x EOB with author providing max assist to stand. Elevated not to use platform this session due to pt inability to follow NWB LUE. Pt was able to stand pivot from EOB to recliner with max assist of one. Encouraged RN staff to have +2 for any/all transfers for additional safety. RN tech in room assisting pt with feeding/breakfast at conclusion of session. Pt will required continued skilled PT to maximize independence while decreasing caregiver burden. Reapplied hip abduction pillow in recliner.    If plan is discharge home, recommend the following: Two people to help with walking and/or transfers;Two people to help with bathing/dressing/bathroom     Equipment Recommendations  Other (comment) (Platform (LUE) for RW)       Precautions / Restrictions Precautions Precautions: Fall Recall of Precautions/Restrictions: Impaired Required Braces or Orthoses: Splint/Cast Splint/Cast: L UE Restrictions Weight Bearing Restrictions Per Provider Order: Yes LUE Weight Bearing Per Provider Order: Non weight bearing LLE Weight Bearing Per Provider Order: Weight bearing as tolerated     Mobility  Bed Mobility Overal bed mobility: Needs Assistance Bed Mobility: Supine to Sit   Supine to sit: Max assist, Total assist, HOB elevated  General bed mobility comments: pt continues to require extensive assistance to achieve EOB sitting. Max assist + vcs throughout. total assist to safely/fully achieve EOB sitting.    Transfers Overall transfer level: Needs assistance Equipment used: None Environmental education officer supported pt under axillas all 3 transfers) Transfers: Sit to/from Stand Sit to Stand: Max assist Stand pivot transfers: Max assist  General transfer comment: pt performed STS 3 x EOB prior to stand pivot to recliner towards R. max assist + constantr vcs to not use LUE. Pt's cognition greatly impacts session progression. RN tech in room at conclusion of session to assist pt with eating breakfast.    Ambulation/Gait  General Gait Details: currently unable   Balance Overall balance assessment: Needs assistance Sitting-balance support: No upper extremity supported Sitting balance-Leahy Scale: Poor     Standing balance support: Single extremity supported, During functional activity Standing balance-Leahy Scale: Zero    Communication Communication Communication: Impaired Factors Affecting Communication: Other (comment) (Severe dementia)  Cognition Arousal: Lethargic Behavior During Therapy: Anxious, Restless   PT - Cognitive impairments: History of cognitive impairments   PT - Cognition Comments: pt has baseline cognition deficits. overall anxious/restless this morning. tremulous throughout session. constant cueing for relaxation and focusing on desired task requested of her Following commands: Impaired Following commands impaired: Follows one step commands inconsistently    Cueing Cueing Techniques: Verbal cues         Pertinent Vitals/Pain Pain Assessment Pain Assessment: PAINAD Breathing: occasional labored breathing, short period of hyperventilation Negative Vocalization: occasional moan/groan, low speech, negative/disapproving quality Facial Expression: sad,  frightened, frown Body Language: tense, distressed  pacing, fidgeting Consolability: distracted or reassured by voice/touch PAINAD Score: 5 Pain Location: L hip Pain Descriptors / Indicators: Aching, Grimacing Pain Intervention(s): Limited activity within patient's tolerance, Monitored during session, RN gave pain meds during session, Repositioned     PT Goals (current goals can now be found in the care plan section) Acute Rehab PT Goals Patient Stated Goal: [facility] at discharge per chart not patient Progress towards PT goals: Not progressing toward goals - comment (cognition greatly impacting session progression)    Frequency    7X/week       AM-PAC PT "6 Clicks" Mobility   Outcome Measure  Help needed turning from your back to your side while in a flat bed without using bedrails?: A Lot Help needed moving from lying on your back to sitting on the side of a flat bed without using bedrails?: A Lot Help needed moving to and from a bed to a chair (including a wheelchair)?: A Lot Help needed standing up from a chair using your arms (e.g., wheelchair or bedside chair)?: A Lot Help needed to walk in hospital room?: Total Help needed climbing 3-5 steps with a railing? : Total 6 Click Score: 10    End of Session   Activity Tolerance: Patient tolerated treatment well Patient left: in chair;with call bell/phone within reach;with chair alarm set;with family/visitor present Nurse Communication: Mobility status PT Visit Diagnosis: Muscle weakness (generalized) (M62.81);Difficulty in walking, not elsewhere classified (R26.2)     Time: 1610-9604 PT Time Calculation (min) (ACUTE ONLY): 20 min  Charges:    $Therapeutic Activity: 8-22 mins PT General Charges $$ ACUTE PT VISIT: 1 Visit                    Jetta Lout PTA 11/18/23, 9:16 AM

## 2023-11-18 NOTE — Progress Notes (Signed)
 Subjective: 3 Days Post-Op Procedure(s) (LRB): HEMIARTHROPLASTY (BIPOLAR) HIP, POSTERIOR APPROACH FOR FRACTURE (Left) CLOSED REDUCTION, WRIST (Left) Patient is sleeping soundly this morning.  Family is at the bedside.  She not any significant problems other than a little more advanced dementia.  Hip is stable.  Dressing is dry.  The hemoglobin remains fairly stable.  Plans are for her to go to [facility] skilled nursing.  Patient reports pain as mild.  Objective:   VITALS:   Vitals:   11/18/23 0420 11/18/23 0725  BP: 112/64 115/72  Pulse: 78 74  Resp: 16 16  Temp: 98.3 F (36.8 C) 98.4 F (36.9 C)  SpO2: 96% 97%    Neurologically intact Neurovascular intact Incision: dressing C/D/I  LABS Recent Labs    11/16/23 0221 11/17/23 0523 11/18/23 0445  HGB 10.7* 10.0* 9.4*  HCT 32.1* 29.8* 27.3*  WBC 16.1* 11.7* 10.1  PLT 141* 122* 128*    Recent Labs    11/16/23 0221 11/17/23 0523 11/18/23 0445  NA 134* 134* 133*  K 3.4* 2.9* 4.4  BUN 17 17 21   CREATININE 0.65 0.65 0.56  GLUCOSE 145* 102* 133*    No results for input(s): "LABPT", "INR" in the last 72 hours.   Assessment/Plan: 3 Days Post-Op Procedure(s) (LRB): HEMIARTHROPLASTY (BIPOLAR) HIP, POSTERIOR APPROACH FOR FRACTURE (Left) CLOSED REDUCTION, WRIST (Left)   Advance diet Up with therapy Discharge to SNF when medically indicated Discharge on ASA 81 mg twice daily for 8 weeks for DVT prophylaxis Weightbearing as tolerated with platform walker Return to clinic in 2 weeks or so for x-rays and cast change Instruct nursing to maintain the abduction pillow when she goes to skilled nursing as she is at high risk for dislocation of the hip with her dementia.

## 2023-11-18 NOTE — Plan of Care (Signed)

## 2023-11-19 DIAGNOSIS — D62 Acute posthemorrhagic anemia: Secondary | ICD-10-CM | POA: Diagnosis not present

## 2023-11-19 DIAGNOSIS — F03918 Unspecified dementia, unspecified severity, with other behavioral disturbance: Secondary | ICD-10-CM

## 2023-11-19 DIAGNOSIS — S72002S Fracture of unspecified part of neck of left femur, sequela: Secondary | ICD-10-CM | POA: Diagnosis not present

## 2023-11-19 DIAGNOSIS — R338 Other retention of urine: Secondary | ICD-10-CM | POA: Diagnosis not present

## 2023-11-19 DIAGNOSIS — E876 Hypokalemia: Secondary | ICD-10-CM

## 2023-11-19 DIAGNOSIS — S62102S Fracture of unspecified carpal bone, left wrist, sequela: Secondary | ICD-10-CM | POA: Diagnosis not present

## 2023-11-19 DIAGNOSIS — E038 Other specified hypothyroidism: Secondary | ICD-10-CM

## 2023-11-19 DIAGNOSIS — E440 Moderate protein-calorie malnutrition: Secondary | ICD-10-CM

## 2023-11-19 DIAGNOSIS — D696 Thrombocytopenia, unspecified: Secondary | ICD-10-CM | POA: Insufficient documentation

## 2023-11-19 HISTORY — DX: Thrombocytopenia, unspecified: D69.6

## 2023-11-19 HISTORY — DX: Fracture of unspecified part of neck of left femur, sequela: S72.002S

## 2023-11-19 HISTORY — DX: Fracture of unspecified carpal bone, left wrist, sequela: S62.102S

## 2023-11-19 LAB — BASIC METABOLIC PANEL
Anion gap: 3 — ABNORMAL LOW (ref 5–15)
BUN: 16 mg/dL (ref 8–23)
CO2: 27 mmol/L (ref 22–32)
Calcium: 8.1 mg/dL — ABNORMAL LOW (ref 8.9–10.3)
Chloride: 107 mmol/L (ref 98–111)
Creatinine, Ser: 0.55 mg/dL (ref 0.44–1.00)
GFR, Estimated: >60 mL/min (ref >60)
Glucose, Bld: 109 mg/dL — ABNORMAL HIGH (ref 70–99)
Potassium: 3.8 mmol/L (ref 3.5–5.1)
Sodium: 137 mmol/L (ref 135–145)

## 2023-11-19 LAB — CBC
HCT: 26.4 % — ABNORMAL LOW (ref 36.0–46.0)
Hemoglobin: 8.7 g/dL — ABNORMAL LOW (ref 12.0–15.0)
MCH: 32 pg (ref 26.0–34.0)
MCHC: 33 g/dL (ref 30.0–36.0)
MCV: 97.1 fL (ref 80.0–100.0)
Platelets: 150 10*3/uL (ref 150–400)
RBC: 2.72 MIL/uL — ABNORMAL LOW (ref 3.87–5.11)
RDW: 12.8 % (ref 11.5–15.5)
WBC: 9.6 10*3/uL (ref 4.0–10.5)
nRBC: 0 % (ref 0.0–0.2)

## 2023-11-19 NOTE — Assessment & Plan Note (Signed)
 Continue supplements

## 2023-11-19 NOTE — Assessment & Plan Note (Signed)
 Hemoglobin stable at 9.1.  Continue to monitor.  Continue iron

## 2023-11-19 NOTE — Assessment & Plan Note (Signed)
 Reduced by Dr. Hyacinth Meeker on 3/22.

## 2023-11-19 NOTE — Assessment & Plan Note (Signed)
 Last platelet count normal range

## 2023-11-19 NOTE — Assessment & Plan Note (Signed)
 Left hip hemiarthroplasty done by Dr. Hyacinth Meeker on 3/22.  Pain controlled.  Will need rehab.

## 2023-11-19 NOTE — Hospital Course (Signed)
 83 y.o. female with medical history significant of dementia, hypothyroidism, arthritis, was found on the floor after a unwitnessed fall.   Patient has had worsening of generalized weakness for last few months and sustained a fall this morning " knees give up" and hit her left side.  Excruciating pain associated with left wrist and left hip and unable to get up again by herself.  Denied any prodromes of lightheadedness chest pain shortness of breath blurry vision before or after the fall.  Patient denied any recent diarrhea, no dysuria no cough. ED Course: Afebrile, blood pressure 159/82, heart rate 68 x-ray showed negative CT head and neck study for acute intracranial findings were fracture with dislocation or subluxation, hip image showed left femoral neck fracture, left wrist x-ray showed left radius fracture and left distal ulnar metadiaphysis fracture.  CBC BMP largely normal.  3/22.  Left hip hemiarthroplasty by Dr. Deeann Saint and also close reduction of the left wrist with splint. 3/26.  Hemoglobin 8.7. awaiting passr to go out to rehab 3/27.  Hemoglobin 9.1.  Orthopedics recommend asa 81 mg po bid for 8 weeks then can stop after.

## 2023-11-19 NOTE — Progress Notes (Signed)
 Progress Note   Patient: Samantha Hanna ZOX:[id number] DOB: [birth year] DOA: 11/15/2023     4 DOS: the patient was seen and examined on 11/19/2023   Brief hospital course: 83 y.o. female with medical history significant of dementia, hypothyroidism, arthritis, was found on the floor after a unwitnessed fall.   Patient has had worsening of generalized weakness for last few months and sustained a fall this morning " knees give up" and hit her left side.  Excruciating pain associated with left wrist and left hip and unable to get up again by herself.  Denied any prodromes of lightheadedness chest pain shortness of breath blurry vision before or after the fall.  Patient denied any recent diarrhea, no dysuria no cough. ED Course: Afebrile, blood pressure 159/82, heart rate 68 x-ray showed negative CT head and neck study for acute intracranial findings were fracture with dislocation or subluxation, hip image showed left femoral neck fracture, left wrist x-ray showed left radius fracture and left distal ulnar metadiaphysis fracture.  CBC BMP largely normal.  3/22.  Left hip hemiarthroplasty by Dr. Deeann Saint and also close reduction of the left wrist with splint. 3/26.  Hemoglobin 8.7. awaiting passr to go out to rehab    Assessment and Plan: * Closed hip fracture requiring operative repair, left, sequela Left hip hemiarthroplasty done by Dr. Hyacinth Meeker on 3/22.  Pain controlled.  Will need rehab.  Left wrist fracture, sequela Reduced by Dr. Hyacinth Meeker on 3/22.  Acute blood loss anemia Hemoglobin drifted down to 8.7.  Continue to monitor.  Acute urinary retention Foley replaced on 11/17/2023.  Hypothyroidism Continue levothyroxine  Malnutrition of moderate degree Continue supplements  Dementia with behavioral disturbance (HCC) Likely secondary to fracture, being in an unfamiliar environment, anesthesia and pain.  As per family, doing better today.  Hypokalemia I placed during hospital  course  Thrombocytopenia (HCC) Last platelet count normal range        Subjective: Patient has dementia and is able to talk but not following commands.  Patient did move her right leg on her own.  Initially admitted with hip fracture.  Physical Exam: Vitals:   11/18/23 1530 11/18/23 1946 11/19/23 0517 11/19/23 0816  BP:  (!) 118/55 (!) 100/52 (!) 112/46  Pulse:  87 74 76  Resp:  18 18 16   Temp:  99.1 F (37.3 C) 98.2 F (36.8 C) 98.7 F (37.1 C)  TempSrc:   Oral Oral  SpO2: 93% 94% 99% 100%  Weight:      Height:       Physical Exam HENT:     Head: Normocephalic.     Mouth/Throat:     Pharynx: No oropharyngeal exudate.  Eyes:     General: Lids are normal.     Conjunctiva/sclera: Conjunctivae normal.  Cardiovascular:     Rate and Rhythm: Normal rate and regular rhythm.     Heart sounds: Normal heart sounds, S1 normal and S2 normal.  Pulmonary:     Breath sounds: No decreased breath sounds, wheezing, rhonchi or rales.  Abdominal:     Palpations: Abdomen is soft.     Tenderness: There is no abdominal tenderness.  Musculoskeletal:     Right lower leg: No swelling.     Left lower leg: No swelling.  Skin:    General: Skin is warm.     Findings: No rash.  Neurological:     Mental Status: She is alert.     Comments: Patient moves her right leg on room but  did not move her legs to command.     Data Reviewed: Creatinine 0.55, hemoglobin 8.7, white blood cell count 9.6, platelet count 150  Family Communication: Spoke with daughter at the bedside  Disposition: Status is: Inpatient Remains inpatient appropriate because: Awaiting passr to go out to rehab.  Planned Discharge Destination: Rehab    Time spent: 28 minutes  Author: Alford Highland, MD 11/19/2023 3:21 PM  For on call review [email or web address].

## 2023-11-19 NOTE — Progress Notes (Signed)
 Subjective: 4 Days Post-Op Procedure(s) (LRB): HEMIARTHROPLASTY (BIPOLAR) HIP, POSTERIOR APPROACH FOR FRACTURE (Left) CLOSED REDUCTION, WRIST (Left) Patient is more awake today.  She remains quite confused.  Her daughters are at the bedside.  Left arm splint remains intact.  The hip remained stable.  Dressing dry.  Hemoglobin is drifted down slightly.  Arrangements have been made for her to go to [facility] skilled nursing.  Patient reports pain as mild.  Objective:   VITALS:   Vitals:   11/19/23 0517 11/19/23 0816  BP: (!) 100/52 (!) 112/46  Pulse: 74 76  Resp: 18 16  Temp: 98.2 F (36.8 C) 98.7 F (37.1 C)  SpO2: 99% 100%    Neurologically intact Neurovascular intact Incision: dressing C/D/I  LABS Recent Labs    11/17/23 0523 11/18/23 0445 11/19/23 0459  HGB 10.0* 9.4* 8.7*  HCT 29.8* 27.3* 26.4*  WBC 11.7* 10.1 9.6  PLT 122* 128* 150    Recent Labs    11/17/23 0523 11/18/23 0445 11/19/23 0459  NA 134* 133* 137  K 2.9* 4.4 3.8  BUN 17 21 16   CREATININE 0.65 0.56 0.55  GLUCOSE 102* 133* 109*    No results for input(s): "LABPT", "INR" in the last 72 hours.   Assessment/Plan: 4 Days Post-Op Procedure(s) (LRB): HEMIARTHROPLASTY (BIPOLAR) HIP, POSTERIOR APPROACH FOR FRACTURE (Left) CLOSED REDUCTION, WRIST (Left) Discharged to [facility] skilled nursing when approvals are obtained 81 mg ASA twice daily for 8 weeks postdischarge Reinforced to the nursing home that she needs to wear the abduction pillow for 6 weeks. Weightbearing as tolerated left leg and platform walker for the arm Return to clinic in 2 weeks for exam and x-rays

## 2023-11-19 NOTE — Assessment & Plan Note (Signed)
 Foley replaced on 11/17/2023.

## 2023-11-19 NOTE — Assessment & Plan Note (Signed)
 Likely secondary to fracture, being in an unfamiliar environment, anesthesia and pain.  Patient doing better last two days.

## 2023-11-19 NOTE — Assessment & Plan Note (Signed)
Replaced during hospital course 

## 2023-11-19 NOTE — Plan of Care (Signed)
   Problem: Elimination: Goal: Will not experience complications related to bowel motility Outcome: Progressing

## 2023-11-19 NOTE — Assessment & Plan Note (Signed)
 Continue levothyroxine

## 2023-11-19 NOTE — Progress Notes (Signed)
 Physical Therapy Treatment Patient Details Name: Samantha Hanna MRN: [id number] DOB: 1940-09-08 Today's Date: 11/19/2023   History of Present Illness presented to ER after unwitnessed fall at memory care facility, acute onset of L hip and L wrist pain; admitted for management of displaced subcaptical fracture of L hip, L distal radial/ulnar fracture s/p L hip hemiarthroplasty (posterior approach, WBAT) and closed reduction of L wrist (NWB) 11/15/23.    PT Comments  Pt was supine in bed upon arrival. She has removed abduction pillow, +1 SCD and LUE arm splint. Pt remains overall disoriented and confused. Has baseline dementia. Pt was overall more lethargic today versus previous date. Less severe tremors and anxiety with movements however pt still remains overall anxious with mobility/transfers. She continues to required extensive assistance and vcs for all desired task requested of her. She was able to stand EOB to LUE platform RW a few times EOB but still needs extensive assistance. Pt returned to supine at conclusion of session with bed alarm set. RN aware pt has been removing splint, abduction pillow, etc. Dc recs remain appropriate to maximize independence nd safety with all ADLs.    If plan is discharge home, recommend the following: Two people to help with walking and/or transfers;Two people to help with bathing/dressing/bathroom     Equipment Recommendations  Other (comment) (LUE platform for platform RW)       Precautions / Restrictions Precautions Precautions: Fall Recall of Precautions/Restrictions: Impaired Required Braces or Orthoses: Splint/Cast Splint/Cast: L UE Restrictions Weight Bearing Restrictions Per Provider Order: Yes LUE Weight Bearing Per Provider Order: Non weight bearing LLE Weight Bearing Per Provider Order: Weight bearing as tolerated     Mobility  Bed Mobility Overal bed mobility: Needs Assistance Bed Mobility: Supine to Sit, Sit to Supine  Supine to sit: Max  assist, Total assist, HOB elevated Sit to supine: Total assist   Transfers Overall transfer level: Needs assistance Equipment used: Left platform walker Transfers: Sit to/from Stand Sit to Stand: Max assist, Total assist, From elevated surface  General transfer comment: Pt continues to require extensive assistance to stand EOB 3 x. Bed height severely elevated with max vcing throughout for NWB and overall technique improvements. cognition continues to severely limit session progression.    Ambulation/Gait  General Gait Details: unsafe to progress away from EOB    Balance Overall balance assessment: Needs assistance Sitting-balance support: No upper extremity supported Sitting balance-Leahy Scale: Poor     Standing balance support: Bilateral upper extremity supported, During functional activity, Reliant on assistive device for balance Standing balance-Leahy Scale: Poor       Communication Communication Communication: Impaired  Cognition Arousal: Lethargic Behavior During Therapy: Anxious   PT - Cognitive impairments: History of cognitive impairments    PT - Cognition Comments: pt has baseline cognition deficits. Overall less anxious this morning + less tremulous throughout session. Pt still lacls any insight of current situation or wt bearing restrictions. Pt had removed L arm splint/ SCDs/ and abduction pillows prior to author arriving. Re-wrapped and re-applied splint prior to OOB activity Following commands: Impaired Following commands impaired: Follows one step commands inconsistently    Cueing Cueing Techniques: Verbal cues         Pertinent Vitals/Pain Pain Assessment Pain Assessment: PAINAD Breathing: occasional labored breathing, short period of hyperventilation Negative Vocalization: occasional moan/groan, low speech, negative/disapproving quality Facial Expression: sad, frightened, frown Body Language: relaxed Consolability: distracted or reassured by  voice/touch PAINAD Score: 4 Pain Location: L hip/ L arm Pain  Descriptors / Indicators: Aching, Grimacing Pain Intervention(s): Limited activity within patient's tolerance, Monitored during session, Premedicated before session, Repositioned     PT Goals (current goals can now be found in the care plan section) Acute Rehab PT Goals Patient Stated Goal: [facility] at discharge per chart not patient Progress towards PT goals: Not progressing toward goals - comment    Frequency    7X/week       AM-PAC PT "6 Clicks" Mobility   Outcome Measure  Help needed turning from your back to your side while in a flat bed without using bedrails?: A Lot Help needed moving from lying on your back to sitting on the side of a flat bed without using bedrails?: A Lot Help needed moving to and from a bed to a chair (including a wheelchair)?: A Lot Help needed standing up from a chair using your arms (e.g., wheelchair or bedside chair)?: A Lot Help needed to walk in hospital room?: Total Help needed climbing 3-5 steps with a railing? : Total 6 Click Score: 10    End of Session Equipment Utilized During Treatment: Gait belt Activity Tolerance: Patient tolerated treatment well;Other (comment) (cognition + wt bearing restrictions greatly limiting session progression.) Patient left: in bed;with call bell/phone within reach;with SCD's reapplied (Abduction pillow in place post session) Nurse Communication: Mobility status PT Visit Diagnosis: Muscle weakness (generalized) (M62.81);Difficulty in walking, not elsewhere classified (R26.2)     Time: 0742-0800 PT Time Calculation (min) (ACUTE ONLY): 18 min  Charges:    $Therapeutic Activity: 8-22 mins PT General Charges $$ ACUTE PT VISIT: 1 Visit                     Jetta Lout PTA 11/19/23, 12:26 PM

## 2023-11-19 NOTE — TOC Progression Note (Signed)
 Transition of Care [facility]) - Progression Note    Patient Details  Name: [name] MRN: [id number] Date of Birth: [birth year]  [facility]) CM/SW Contact  Marlowe Sax, RN Phone Number: 11/19/2023, 4:20 PM  Clinical Narrative:    Ernie Avena [phone]   Expected Discharge Plan: Skilled Nursing Facility Barriers to Discharge: Awaiting State Approval [facility] Roberts)  Expected Discharge Plan and Services   Discharge Planning Services: CM Consult   Living arrangements for the past 2 months: Assisted Living Facility                   DME Agency: NA       HH Arranged: NA           Social Determinants of Health (SDOH) Interventions SDOH Screenings   Food Insecurity: No Food Insecurity (11/15/2023)  Housing: Low Risk  (11/15/2023)  Transportation Needs: No Transportation Needs (11/15/2023)  Utilities: Not At Risk (11/15/2023)  Depression (PHQ2-9): Low Risk  (01/16/2023)  Financial Resource Strain: Low Risk  (02/05/2022)  Physical Activity: Sufficiently Active (02/05/2022)  Social Connections: Socially Integrated (11/15/2023)  Stress: No Stress Concern Present (02/05/2022)  Tobacco Use: Low Risk  (11/15/2023)    Readmission Risk Interventions     No data to display

## 2023-11-20 DIAGNOSIS — R338 Other retention of urine: Secondary | ICD-10-CM | POA: Diagnosis not present

## 2023-11-20 DIAGNOSIS — S62102S Fracture of unspecified carpal bone, left wrist, sequela: Secondary | ICD-10-CM | POA: Diagnosis not present

## 2023-11-20 DIAGNOSIS — D62 Acute posthemorrhagic anemia: Secondary | ICD-10-CM | POA: Diagnosis not present

## 2023-11-20 DIAGNOSIS — S72002S Fracture of unspecified part of neck of left femur, sequela: Secondary | ICD-10-CM | POA: Diagnosis not present

## 2023-11-20 LAB — HEMOGLOBIN: Hemoglobin: 9.1 g/dL — ABNORMAL LOW (ref 12.0–15.0)

## 2023-11-20 MED ORDER — ASPIRIN 81 MG PO TBEC
81.0000 mg | DELAYED_RELEASE_TABLET | Freq: Two times a day (BID) | ORAL | Status: DC
  Administered 2023-11-20: 81 mg via ORAL
  Filled 2023-11-20: qty 1

## 2023-11-20 MED ORDER — ENSURE ENLIVE PO LIQD: 237.0000 mL | Freq: Three times a day (TID) | ORAL | 0 refills | Status: DC

## 2023-11-20 MED ORDER — FERROUS SULFATE 325 (65 FE) MG PO TABS: 325.0000 mg | ORAL_TABLET | Freq: Every day | ORAL | 0 refills | Status: DC

## 2023-11-20 MED ORDER — TRAMADOL HCL 50 MG PO TABS: 50.0000 mg | ORAL_TABLET | Freq: Three times a day (TID) | ORAL | 0 refills | Status: DC | PRN

## 2023-11-20 MED ORDER — DOCUSATE SODIUM 100 MG PO CAPS: 100.0000 mg | ORAL_CAPSULE | Freq: Two times a day (BID) | ORAL | 0 refills | Status: DC

## 2023-11-20 MED ORDER — ACETAMINOPHEN 325 MG PO TABS: 650.0000 mg | ORAL_TABLET | Freq: Four times a day (QID) | ORAL | Status: AC | PRN

## 2023-11-20 MED ORDER — ASPIRIN 81 MG PO TBEC: 81.0000 mg | DELAYED_RELEASE_TABLET | Freq: Two times a day (BID) | ORAL | 12 refills | Status: DC

## 2023-11-20 MED ORDER — LORAZEPAM 0.5 MG PO TABS: 0.5000 mg | ORAL_TABLET | Freq: Every day | ORAL | 0 refills | Status: DC

## 2023-11-20 NOTE — Care Management Important Message (Signed)
 Important Message  Patient Details  Name: KATLEN SEYER MRN: [id number] Date of Birth: 11-25-1940   Important Message Given:  Yes - Medicare IM     Bernadette Hoit 11/20/2023, 2:28 PM

## 2023-11-20 NOTE — Discharge Summary (Signed)
 Physician Discharge Summary   Patient: Samantha Hanna MRN: [id number] DOB: December 15, 1940  Admit date:     11/15/2023  Discharge date: 11/20/23  Discharge Physician: Alford Highland   PCP: Sherlene Shams, MD   Recommendations at discharge:    Follow up with team at rehab one day Follow up with orthopedics   Discharge Diagnoses: Principal Problem:   Closed hip fracture requiring operative repair, left, sequela Active Problems:   Left wrist fracture, sequela   Acute blood loss anemia   Acute urinary retention   Hypothyroidism   Malnutrition of moderate degree   Thrombocytopenia (HCC)   Hypokalemia   Dementia with behavioral disturbance [facility])   Hospital Course: 83 y.o. female with medical history significant of dementia, hypothyroidism, arthritis, was found on the floor after a unwitnessed fall.   Patient has had worsening of generalized weakness for last few months and sustained a fall this morning " knees give up" and hit her left side.  Excruciating pain associated with left wrist and left hip and unable to get up again by herself.  Denied any prodromes of lightheadedness chest pain shortness of breath blurry vision before or after the fall.  Patient denied any recent diarrhea, no dysuria no cough. ED Course: Afebrile, blood pressure 159/82, heart rate 68 x-ray showed negative CT head and neck study for acute intracranial findings were fracture with dislocation or subluxation, hip image showed left femoral neck fracture, left wrist x-ray showed left radius fracture and left distal ulnar metadiaphysis fracture.  CBC BMP largely normal.  3/22.  Left hip hemiarthroplasty by Dr. Deeann Saint and also close reduction of the left wrist with splint. 3/26.  Hemoglobin 8.7. awaiting passr to go out to rehab 3/27.  Hemoglobin 9.1.  Orthopedics recommend asa 81 mg po bid for 8 weeks then can stop after.    Assessment and Plan: * Closed hip fracture requiring operative repair, left,  sequela Left hip hemiarthroplasty done by Dr. Hyacinth Meeker on 3/22.  Pain controlled.  Stable for rehab today.  Orthopedic recommends aspirin 81mg  po bid for 8 weeks for dvt profylaxis.  Convert to tylenol as quickly as possible.  Left wrist fracture, sequela Reduced by Dr. Hyacinth Meeker on 3/22.  Continue splint.  Acute blood loss anemia Hemoglobin stable at 9.1.  Continue to monitor.  Continue iron  Acute urinary retention Foley replaced on 11/17/2023.  Recommend voiding trial in 3 days, if fails voiding trial will need urology follow up.  Hypothyroidism Continue levothyroxine  Malnutrition of moderate degree Continue supplements  Dementia with behavioral disturbance (HCC) Likely secondary to fracture, being in an unfamiliar environment, anesthesia and pain.  Patient doing better last two days.  Hypokalemia Replaced during hospital course  Thrombocytopenia (HCC) Last platelet count normal range         Consultants: Orthopedic surgery Procedures performed: left hip hemiarthroplasty Disposition: Rehabilitation facility Diet recommendation:  Soft diet DISCHARGE MEDICATION: Allergies as of 11/20/2023       Reactions   Hydrocodone    Hallucination   Shellfish Allergy Itching   Statins Other (See Comments)   Unknown    Penicillins Rash   Has patient had a PCN reaction causing immediate rash, facial/tongue/throat swelling, SOB or lightheadedness with hypotension: No Has patient had a PCN reaction causing severe rash involving mucus membranes or skin necrosis: No Has patient had a PCN reaction that required hospitalization: No Has patient had a PCN reaction occurring within the last 10 years: No If all of the above answers  are "NO", then may proceed with Cephalosporin use.        Medication List     TAKE these medications    acetaminophen 325 MG tablet Commonly known as: TYLENOL Take 2 tablets (650 mg total) by mouth every 6 (six) hours as needed for mild pain (pain score  1-3) or moderate pain (pain score 4-6) (or temp > 100.5).   aspirin EC 81 MG tablet Take 1 tablet (81 mg total) by mouth 2 (two) times daily. Swallow whole.   docusate sodium 100 MG capsule Commonly known as: COLACE Take 1 capsule (100 mg total) by mouth 2 (two) times daily.   feeding supplement Liqd Take 237 mLs by mouth 3 (three) times daily between meals.   ferrous sulfate 325 (65 FE) MG tablet Take 1 tablet (325 mg total) by mouth daily with breakfast.   LORazepam 0.5 MG tablet Commonly known as: ATIVAN Take 1 tablet (0.5 mg total) by mouth daily.   memantine 5 MG tablet Commonly known as: NAMENDA Take 5 mg by mouth 2 (two) times daily.   nitrofurantoin (macrocrystal-monohydrate) 100 MG capsule Commonly known as: MACROBID Take 100 mg by mouth daily.   OMEGA 3 PO Take 30 mLs by mouth daily. 800 mg of Fish Oil  per serving   sertraline 100 MG tablet Commonly known as: ZOLOFT Take 100 mg by mouth daily.   Synthroid 75 MCG tablet Generic drug: levothyroxine TAKE 1 TABLET BY MOUTH DAILY BEFORE BREAKFAST.   traMADol 50 MG tablet Commonly known as: ULTRAM Take 1 tablet (50 mg total) by mouth every 8 (eight) hours as needed for severe pain (pain score 7-10).        Follow-up Information     Deeann Saint, MD Follow up in 3 week(s).   Specialty: Orthopedic Surgery Why: For suture removal, For wound re-check, For re-evaluation  Please make appointment for the patient prior to leaving the hospital Contact information: [address] [place] [address] [phone]                Discharge Exam: Ceasar Mons Weights   11/16/23 0331 11/17/23 1514  Weight: 61.6 kg 57 kg   Physical Exam HENT:     Head: Normocephalic.     Mouth/Throat:     Pharynx: No oropharyngeal exudate.  Eyes:     General: Lids are normal.     Conjunctiva/sclera: Conjunctivae normal.  Cardiovascular:     Rate and Rhythm: Normal rate and regular rhythm.     Heart sounds:  Normal heart sounds, S1 normal and S2 normal.  Pulmonary:     Breath sounds: No decreased breath sounds, wheezing, rhonchi or rales.  Abdominal:     Palpations: Abdomen is soft.     Tenderness: There is no abdominal tenderness.  Musculoskeletal:     Right lower leg: No swelling.     Left lower leg: No swelling.  Skin:    General: Skin is warm.     Findings: No rash.  Neurological:     Mental Status: She is alert.     Comments: Patient did not move legs to command but took a few steps with physical therapy      Condition at discharge: stable  The results of significant diagnostics from this hospitalization (including imaging, microbiology, ancillary and laboratory) are listed below for reference.   Imaging Studies: DG HIP UNILAT W OR W/O PELVIS 2-3 VIEWS LEFT Result Date: 11/15/2023 CLINICAL DATA:  Postoperative closed left hip fracture. EXAM: DG  HIP (WITH OR WITHOUT PELVIS) 2-3V LEFT COMPARISON:  11/15/2023 FINDINGS: Interval postoperative changes with placement of a left hip hemiarthroplasty. Non cemented component appears well seated. No evidence of new acute fracture or dislocation. Soft tissue gas and skin clips are consistent with recent surgery. Visualized pelvis appears intact. Degenerative changes in the right hip. IMPRESSION: Postoperative left hip replacement with components appearing well seated. No acute complication is demonstrated radiographically. Electronically Signed   By: Burman Nieves M.D.   On: 11/15/2023 22:00   DG MINI C-ARM IMAGE ONLY Result Date: 11/15/2023 There is no interpretation for this exam.  This order is for images obtained during a surgical procedure.  Please See "Surgeries" Tab for more information regarding the procedure.   CT Cervical Spine Wo Contrast Result Date: 11/15/2023 CLINICAL DATA:  83 year old female status post unwitnessed fall in bathroom. Found down. Pain. EXAM: CT CERVICAL SPINE WITHOUT CONTRAST TECHNIQUE: Multidetector CT imaging of  the cervical spine was performed without intravenous contrast. Multiplanar CT image reconstructions were also generated. RADIATION DOSE REDUCTION: This exam was performed according to the departmental dose-optimization program which includes automated exposure control, adjustment of the mA and/or kV according to patient size and/or use of iterative reconstruction technique. COMPARISON:  Head CT today.  Cervical spine CT 08/13/2021. FINDINGS: Alignment: Mildly improved cervical lordosis. Cervicothoracic junction alignment is within normal limits. Bilateral posterior element alignment is within normal limits. Skull base and vertebrae: Motion artifact although less pronounced than on the head CT today. Chronic TMJ degeneration. No skull base fracture identified. No evidence of atlanto-occipital dissociation. C1 and C2 appear aligned, grossly intact. Other cervical vertebrae appear intact. Soft tissues and spinal canal: No prevertebral fluid or swelling. No visible canal hematoma. Negative noncontrast neck soft tissues. Disc levels:  Mild for age cervical spine degeneration. Upper chest: Visible upper thoracic levels appear intact, upper lungs are clear. IMPRESSION: Mild motion artifact. No acute traumatic injury identified in the cervical spine. Electronically Signed   By: Odessa Fleming M.D.   On: 11/15/2023 09:50   DG Chest Portable 1 View Result Date: 11/15/2023 CLINICAL DATA:  83 year old female status post unwitnessed fall in bathroom. Found down. Pain. EXAM: PORTABLE CHEST 1 VIEW COMPARISON:  Chest radiographs 09/23/2016. FINDINGS: Portable AP supine view at 0919 hours. Lung volumes and mediastinal contours are within normal limits. Visualized tracheal air column is within normal limits. Allowing for portable technique the lungs are clear. No pneumothorax or pleural effusion. No acute osseous abnormality identified. Negative visible bowel gas. IMPRESSION: No acute cardiopulmonary abnormality or acute traumatic injury  identified. Electronically Signed   By: Odessa Fleming M.D.   On: 11/15/2023 09:46   DG Knee 2 Views Left Result Date: 11/15/2023 CLINICAL DATA:  83 year old female status post unwitnessed fall in bathroom. Found down. Pain. EXAM: LEFT KNEE - 1-2 VIEW COMPARISON:  None Available. FINDINGS: Two views at 0906 hours. Bone mineralization is within normal limits. Normal joint spaces and alignment for age. Patella appears intact. No evidence of joint effusion. No acute osseous abnormality identified. IMPRESSION: No acute fracture or dislocation identified about the left knee. Electronically Signed   By: Odessa Fleming M.D.   On: 11/15/2023 09:44   DG Wrist Complete Left Result Date: 11/15/2023 CLINICAL DATA:  82 year old female status post unwitnessed fall in bathroom. Found down. Pain. EXAM: LEFT WRIST - COMPLETE 3+ VIEW COMPARISON:  None Available. FINDINGS: Four views at 0856 hours. Bone mineralization is within normal limits for age. Comminuted and dorsally impacted distal left radius  fracture. DRU and radiocarpal joint involvement. Unclear whether the ulnar styloid is intact but there is a small 6 mm fracture fragment at the distal ulna metadiaphysis. Generalized soft tissue swelling. Carpal bones appear to remain intact and aligned. No metacarpal fracture identified. IMPRESSION: 1. Comminuted and dorsally impacted distal left radius fracture. 2. Small fracture fragment at the distal ulna metadiaphysis. Electronically Signed   By: Odessa Fleming M.D.   On: 11/15/2023 09:44   DG Hip Unilat W or Wo Pelvis 2-3 Views Left Result Date: 11/15/2023 CLINICAL DATA:  83 year old female status post unwitnessed fall in bathroom. Found down. Pain. EXAM: DG HIP (WITH OR WITHOUT PELVIS) 2-3V LEFT COMPARISON:  None Available. FINDINGS: Three views at 0914 hours. Left femoral neck fracture with varus impaction. The intertrochanteric segment appears to remain intact. The left femoral head appears to remain normally located. No superimposed pelvis  fracture identified. Grossly intact proximal right femur. Bone mineralization is within normal limits for age. Symmetric SI joints. Nonobstructed bowel-gas pattern. IMPRESSION: Acute Left femoral neck fracture with varus impaction. Electronically Signed   By: Odessa Fleming M.D.   On: 11/15/2023 09:42   CT HEAD WO CONTRAST ( ) Result Date: 11/15/2023 CLINICAL DATA:  83 year old female status post unwitnessed fall in bathroom. Found down. Pain. EXAM: CT HEAD WITHOUT CONTRAST TECHNIQUE: Contiguous axial images were obtained from the base of the skull through the vertex without intravenous contrast. RADIATION DOSE REDUCTION: This exam was performed according to the departmental dose-optimization program which includes automated exposure control, adjustment of the mA and/or kV according to patient size and/or use of iterative reconstruction technique. COMPARISON:  Brain MRI 05/04/2015.  Head CT 08/13/2021. FINDINGS: Brain: Motion artifact throughout the posterior fossa. No evidence of intracranial mass effect or ventriculomegaly. No acute intracranial hemorrhage identified. No cortically based acute infarct identified. Vascular: Motion artifact at the skull base. Skull: Motion artifact throughout the skull base. No acute osseous abnormality identified. Sinuses/Orbits: Motion artifact. Other: Visualized scalp soft tissues are within normal limits. IMPRESSION: Motion artifact. No acute intracranial abnormality or acute traumatic injury identified. Electronically Signed   By: Odessa Fleming M.D.   On: 11/15/2023 09:41    Microbiology: Results for orders placed or performed during the hospital encounter of 11/15/23  MRSA Next Gen by PCR, Nasal     Status: None   Collection Time: 11/18/23  2:59 AM   Specimen: Nasal Mucosa; Nasal Swab  Result Value Ref Range Status   MRSA by PCR Next Gen NOT DETECTED NOT DETECTED Final    Comment: (NOTE) The GeneXpert MRSA Assay (FDA approved for NASAL specimens only), is one component of  a comprehensive MRSA colonization surveillance program. It is not intended to diagnose MRSA infection nor to guide or monitor treatment for MRSA infections. Test performance is not FDA approved in patients less than 47 years old. Performed at [facility], [address] [place], [place] [address]     Labs: CBC: Recent Labs  Lab 11/15/23 0827 11/16/23 0221 11/17/23 0523 11/18/23 0445 11/19/23 0459 11/20/23 0406  WBC 6.6 16.1* 11.7* 10.1 9.6  --   NEUTROABS 4.9  --   --   --   --   --   HGB 12.7 10.7* 10.0* 9.4* 8.7* 9.1*  HCT 38.1 32.1* 29.8* 27.3* 26.4*  --   MCV 94.1 97.0 95.5 94.1 97.1  --   PLT 177 141* 122* 128* 150  --    Basic Metabolic Panel: Recent Labs  Lab 11/15/23 0827 11/16/23 0221  11/17/23 0523 11/18/23 0445 11/19/23 0459  NA 138 134* 134* 133* 137  K 3.6 3.4* 2.9* 4.4 3.8  CL 105 103 99 102 107  CO2 25 25 26 25 27   GLUCOSE 135* 145* 102* 133* 109*  BUN 15 17 17 21 16   CREATININE 0.77 0.65 0.65 0.56 0.55  CALCIUM 8.9 8.1* 8.4* 8.3* 8.1*  MG  --   --  2.2  --   --    Liver Function Tests: Recent Labs  Lab 11/15/23 0827  AST 20  ALT 16  ALKPHOS 60  BILITOT 0.8  PROT 6.8  ALBUMIN 4.0   CBG: Recent Labs  Lab 11/18/23 0743  GLUCAP 113*    Discharge time spent: greater than 30 minutes.  Signed: Alford Highland, MD [facility] 11/20/2023

## 2023-11-20 NOTE — TOC Progression Note (Signed)
 Transition of Care [facility]) - Progression Note    Patient Details  Name: Samantha Hanna MRN: [id number] Date of Birth: 23-Jan-1941  [facility] CM/SW Contact  Marlowe Sax, RN Phone Number: 11/20/2023, 11:14 AM  Clinical Narrative:    Called Daughter and left a VM, Called [facility] to arrange transport to go to [facility],    Expected Discharge Plan: Skilled Nursing Facility Barriers to Discharge: Awaiting State Approval (PASRR)  Expected Discharge Plan and Services   Discharge Planning Services: CM Consult   Living arrangements for the past 2 months: Assisted Living Facility Expected Discharge Date: 11/20/23                 DME Agency: NA       HH Arranged: NA           Social Determinants of Health (SDOH) Interventions SDOH Screenings   Food Insecurity: No Food Insecurity (11/15/2023)  Housing: Low Risk  (11/15/2023)  Transportation Needs: No Transportation Needs (11/15/2023)  Utilities: Not At Risk (11/15/2023)  Depression (PHQ2-9): Low Risk  (01/16/2023)  Financial Resource Strain: Low Risk  (02/05/2022)  Physical Activity: Sufficiently Active (02/05/2022)  Social Connections: Socially Integrated (11/15/2023)  Stress: No Stress Concern Present (02/05/2022)  Tobacco Use: Low Risk  (11/15/2023)    Readmission Risk Interventions     No data to display

## 2023-11-20 NOTE — Plan of Care (Signed)

## 2023-11-21 ENCOUNTER — Encounter: Payer: Self-pay | Admitting: Student

## 2023-11-21 ENCOUNTER — Non-Acute Institutional Stay (SKILLED_NURSING_FACILITY): Payer: Self-pay | Admitting: Student

## 2023-11-21 DIAGNOSIS — F01B4 Vascular dementia, moderate, with anxiety: Secondary | ICD-10-CM | POA: Diagnosis not present

## 2023-11-21 DIAGNOSIS — E034 Atrophy of thyroid (acquired): Secondary | ICD-10-CM | POA: Diagnosis not present

## 2023-11-21 DIAGNOSIS — S62102S Fracture of unspecified carpal bone, left wrist, sequela: Secondary | ICD-10-CM

## 2023-11-21 DIAGNOSIS — R338 Other retention of urine: Secondary | ICD-10-CM

## 2023-11-21 DIAGNOSIS — S72002S Fracture of unspecified part of neck of left femur, sequela: Secondary | ICD-10-CM | POA: Diagnosis not present

## 2023-11-21 DIAGNOSIS — E440 Moderate protein-calorie malnutrition: Secondary | ICD-10-CM

## 2023-11-21 DIAGNOSIS — D62 Acute posthemorrhagic anemia: Secondary | ICD-10-CM | POA: Diagnosis not present

## 2023-11-21 NOTE — Progress Notes (Signed)
 Provider:  Coralyn Helling, M.D. Location:  Other [facility]) Nursing Home Room Number: 103 A Place of Service:  SNF (31)  PCP: Sherlene Shams, MD Patient Care Team: Sherlene Shams, MD as PCP - General (Internal Medicine)  Extended Emergency Contact Information Primary Emergency Contact: [name],[name] Mobile Phone: [phone] Relation: Daughter  Code Status: Full Code  Goals of Care: Advanced Directive information    11/15/2023    8:29 AM  Advanced Directives  Does Patient Have a Medical Advance Directive? Unable to assess, patient is non-responsive or altered mental status      Chief Complaint  Patient presents with   New Admit To SNF    New admission to [facility]     HPI: Patient is a 83 y.o. female seen today for admission to [facility].  History of Present Illness The patient presents with a recent fall resulting in a closed left hip fracture and left wrist fracture.   She experienced a fall on November 15, 2023, resulting in a closed left hip fracture and a left wrist fracture. The hip fracture required operative repair, and she was discharged on November 20, 2023. The left wrist fracture was reduced and splinted on the day of the injury.  She has a history of dementia with behavioral disturbances, which have shown improvement. However, she was found on the floor after the fall, suggesting possible confusion or disorientation related to her dementia.  She is experiencing urinary retention, and a Foley catheter was replaced on November 17, 2023.  She has a history of anemia and is currently on iron supplementation.  During the review of symptoms, she reported feeling 'itching a little' but did not report any pain. She was able to eat approximately 75% of her breakfast, indicating a fair appetite per nursing staff.    Patient is unable to answer questions directly. She states she is somewhere between [place] and [place] and she was just around the  corner.  Past Medical History - Dementia with behavioral disturbances - Hypothyroidism - Arthritis - Left wrist fracture reduced and in splint - Closed hip fracture requiring operative repair  Medications - Iron supplementation  Social History - Lives in a care facility - Daughter out of town  Results LABS CBC: anemia (11/15/2023)  RADIOLOGY Left wrist X-ray: fracture (11/15/2023) Left hip X-ray: fracture (11/15/2023)  Assessment and Plan   Past Medical History:  Diagnosis Date   Arthritis    Cancer (HCC)    SKIN CANCER-BASAL CELL   GERD (gastroesophageal reflux disease)    Hyperlipidemia    Hypothyroidism    Thyroid disease    Past Surgical History:  Procedure Laterality Date   APPENDECTOMY     CARPOMETACARPAL (CMC) FUSION OF THUMB Left 09/10/2017   Procedure: CARPOMETACARPAL (CMC) FUSION OF THUMB;  Surgeon: Deeann Saint, MD;  Location: [facility] ORS;  Service: Orthopedics;  Laterality: Left;   CLOSED REDUCTION WRIST FRACTURE Left 11/15/2023   Procedure: CLOSED REDUCTION, WRIST;  Surgeon: Deeann Saint, MD;  Location: [facility] ORS;  Service: Orthopedics;  Laterality: Left;   HIP ARTHROPLASTY Left 11/15/2023   Procedure: HEMIARTHROPLASTY (BIPOLAR) HIP, POSTERIOR APPROACH FOR FRACTURE;  Surgeon: Deeann Saint, MD;  Location: [facility] ORS;  Service: Orthopedics;  Laterality: Left;   MELANOMA EXCISION     x 2, Dr. Jarold Motto    reports that she has never smoked. She has never used smokeless tobacco. She reports current alcohol use of about 3.0 standard drinks of alcohol per week. She reports that she  does not use drugs. Social History   Socioeconomic History   Marital status: Married    Spouse name: Not on file   Number of children: Not on file   Years of education: Not on file   Highest education level: Not on file  Occupational History   Not on file  Tobacco Use   Smoking status: Never   Smokeless tobacco: Never  Vaping Use   Vaping status: Never Used  Substance and  Sexual Activity   Alcohol use: Yes    Alcohol/week: 3.0 standard drinks of alcohol    Types: 3 Standard drinks or equivalent per week    Comment: social   Drug use: No   Sexual activity: Yes  Other Topics Concern   Not on file  Social History Narrative   She lives with husband (optometrist) who has Parkinson's disease.  They have two grown children.   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    Highest level of education:  Energy manager   Social Drivers of Health   Financial Resource Strain: Low Risk  (02/05/2022)   Overall Financial Resource Strain (CARDIA)    Difficulty of Paying Living Expenses: Not hard at all  Food Insecurity: No Food Insecurity (11/15/2023)   Hunger Vital Sign    Worried About Running Out of Food in the Last Year: Never true    Ran Out of Food in the Last Year: Never true  Transportation Needs: No Transportation Needs (11/15/2023)   PRAPARE - Administrator, Civil Service (Medical): No    Lack of Transportation (Non-Medical): No  Physical Activity: Sufficiently Active (02/05/2022)   Exercise Vital Sign    Days of Exercise per Week: 5 days    Minutes of Exercise per Session: 30 min  Stress: No Stress Concern Present (02/05/2022)   [employer] of Occupational Health - Occupational Stress Questionnaire    Feeling of Stress : Not at all  Social Connections: Socially Integrated (11/15/2023)   Social Connection and Isolation Panel [NHANES]    Frequency of Communication with Friends and Family: More than three times a week    Frequency of Social Gatherings with Friends and Family: More than three times a week    Attends Religious Services: 1 to 4 times per year    Active Member of Clubs or Organizations: Yes    Attends Banker Meetings: 1 to 4 times per year    Marital Status: Married  Catering manager Violence: Not At Risk (11/15/2023)   Humiliation, Afraid, Rape, and Kick questionnaire    Fear of  Current or Ex-Partner: No    Emotionally Abused: No    Physically Abused: No    Sexually Abused: No    Functional Status Survey:    Family History  Problem Relation Age of Onset   Osteoporosis Mother        Deceased, 54   Coronary artery disease Father 54       Deceased, 50 in MVA   Heart Problems Father    Arthritis Daughter 71       Rheumatiod   Healthy Daughter     Health Maintenance  Topic Date Due   Medicare Annual Wellness (AWV)  02/06/2023   INFLUENZA VACCINE  03/27/2023   MAMMOGRAM  04/06/2023   COVID-19 Vaccine (5 - 2024-25 season) 04/27/2023   DTaP/Tdap/Td (3 - Td or Tdap) 08/14/2031   Pneumonia Vaccine 30+ Years old  Completed   DEXA SCAN  Completed   Zoster Vaccines- Shingrix  Completed   HPV VACCINES  Aged Out   Fecal DNA (Cologuard)  Discontinued    Allergies  Allergen Reactions   Hydrocodone     Hallucination   Shellfish Allergy Itching   Statins Other (See Comments)    Unknown    Penicillins Rash    Has patient had a PCN reaction causing immediate rash, facial/tongue/throat swelling, SOB or lightheadedness with hypotension: No Has patient had a PCN reaction causing severe rash involving mucus membranes or skin necrosis: No Has patient had a PCN reaction that required hospitalization: No Has patient had a PCN reaction occurring within the last 10 years: No If all of the above answers are "NO", then may proceed with Cephalosporin use.     Outpatient Encounter Medications as of 11/21/2023  Medication Sig   acetaminophen (TYLENOL) 325 MG tablet Take 2 tablets (650 mg total) by mouth every 6 (six) hours as needed for mild pain (pain score 1-3) or moderate pain (pain score 4-6) (or temp > 100.5).   aspirin EC 81 MG tablet Take 1 tablet (81 mg total) by mouth 2 (two) times daily. Swallow whole.   docusate sodium (COLACE) 100 MG capsule Take 1 capsule (100 mg total) by mouth 2 (two) times daily.   ferrous sulfate 325 (65 FE) MG tablet Take 1 tablet (325  mg total) by mouth daily with breakfast.   LORazepam (ATIVAN) 0.5 MG tablet Take 1 tablet (0.5 mg total) by mouth daily.   memantine (NAMENDA) 5 MG tablet Take 5 mg by mouth 2 (two) times daily.   nitrofurantoin, macrocrystal-monohydrate, (MACROBID) 100 MG capsule Take 100 mg by mouth daily.   Nutritional Supplements (ENSURE CLEAR PO) Take 1 Can by mouth daily.   Omega-3 Fatty Acids (OMEGA 3 PO) Take 30 mLs by mouth daily. 800 mg of Fish Oil  per serving   sertraline (ZOLOFT) 100 MG tablet Take 100 mg by mouth daily.   SYNTHROID 75 MCG tablet TAKE 1 TABLET BY MOUTH DAILY BEFORE BREAKFAST.   traMADol (ULTRAM) 50 MG tablet Take 1 tablet (50 mg total) by mouth every 8 (eight) hours as needed for severe pain (pain score 7-10).   feeding supplement (ENSURE ENLIVE / ENSURE PLUS) LIQD Take 237 mLs by mouth 3 (three) times daily between meals. (Patient not taking: Reported on 11/21/2023)   No facility-administered encounter medications on file as of 11/21/2023.    Review of Systems  Vitals:   11/21/23 0827  BP: 103/70  Pulse: 69  Weight: 123 lb 8 oz (56 kg)  Height: 5\' 7"  (1.702 m)   Body mass index is 19.34 kg/m. Physical Exam  Labs reviewed: Basic Metabolic Panel: Recent Labs    11/17/23 0523 11/18/23 0445 11/19/23 0459  NA 134* 133* 137  K 2.9* 4.4 3.8  CL 99 102 107  CO2 26 25 27   GLUCOSE 102* 133* 109*  BUN 17 21 16   CREATININE 0.65 0.56 0.55  CALCIUM 8.4* 8.3* 8.1*  MG 2.2  --   --    Liver Function Tests: Recent Labs    11/15/23 0827  AST 20  ALT 16  ALKPHOS 60  BILITOT 0.8  PROT 6.8  ALBUMIN 4.0   No results for input(s): "LIPASE", "AMYLASE" in the last 8760 hours. No results for input(s): "AMMONIA" in the last 8760 hours. CBC: Recent Labs    11/15/23 0827 11/16/23 0221 11/17/23 0523 11/18/23 0445 11/19/23 0459 11/20/23 0406  WBC 6.6   < >  11.7* 10.1 9.6  --   NEUTROABS 4.9  --   --   --   --   --   HGB 12.7   < > 10.0* 9.4* 8.7* 9.1*  HCT 38.1   < >  29.8* 27.3* 26.4*  --   MCV 94.1   < > 95.5 94.1 97.1  --   PLT 177   < > 122* 128* 150  --    < > = values in this interval not displayed.   Cardiac Enzymes: Recent Labs    11/15/23 0827  CKTOTAL 58   BNP: Invalid input(s): "POCBNP" No results found for: "HGBA1C" Lab Results  Component Value Date   TSH 1.898 11/15/2023   Lab Results  Component Value Date   VITAMINB12 219 03/29/2022   Lab Results  Component Value Date   FOLATE 13.2 03/29/2022   Lab Results  Component Value Date   IRON 60 04/17/2015   TIBC 293 04/17/2015    Imaging and Procedures obtained prior to SNF admission: DG HIP UNILAT W OR W/O PELVIS 2-3 VIEWS LEFT Result Date: 11/15/2023 CLINICAL DATA:  Postoperative closed left hip fracture. EXAM: DG HIP (WITH OR WITHOUT PELVIS) 2-3V LEFT COMPARISON:  11/15/2023 FINDINGS: Interval postoperative changes with placement of a left hip hemiarthroplasty. Non cemented component appears well seated. No evidence of new acute fracture or dislocation. Soft tissue gas and skin clips are consistent with recent surgery. Visualized pelvis appears intact. Degenerative changes in the right hip. IMPRESSION: Postoperative left hip replacement with components appearing well seated. No acute complication is demonstrated radiographically. Electronically Signed   By: Burman Nieves M.D.   On: 11/15/2023 22:00   DG MINI C-ARM IMAGE ONLY Result Date: 11/15/2023 There is no interpretation for this exam.  This order is for images obtained during a surgical procedure.  Please See "Surgeries" Tab for more information regarding the procedure.   CT Cervical Spine Wo Contrast Result Date: 11/15/2023 CLINICAL DATA:  83 year old female status post unwitnessed fall in bathroom. Found down. Pain. EXAM: CT CERVICAL SPINE WITHOUT CONTRAST TECHNIQUE: Multidetector CT imaging of the cervical spine was performed without intravenous contrast. Multiplanar CT image reconstructions were also generated.  RADIATION DOSE REDUCTION: This exam was performed according to the departmental dose-optimization program which includes automated exposure control, adjustment of the mA and/or kV according to patient size and/or use of iterative reconstruction technique. COMPARISON:  Head CT today.  Cervical spine CT 08/13/2021. FINDINGS: Alignment: Mildly improved cervical lordosis. Cervicothoracic junction alignment is within normal limits. Bilateral posterior element alignment is within normal limits. Skull base and vertebrae: Motion artifact although less pronounced than on the head CT today. Chronic TMJ degeneration. No skull base fracture identified. No evidence of atlanto-occipital dissociation. C1 and C2 appear aligned, grossly intact. Other cervical vertebrae appear intact. Soft tissues and spinal canal: No prevertebral fluid or swelling. No visible canal hematoma. Negative noncontrast neck soft tissues. Disc levels:  Mild for age cervical spine degeneration. Upper chest: Visible upper thoracic levels appear intact, upper lungs are clear. IMPRESSION: Mild motion artifact. No acute traumatic injury identified in the cervical spine. Electronically Signed   By: Odessa Fleming M.D.   On: 11/15/2023 09:50   DG Chest Portable 1 View Result Date: 11/15/2023 CLINICAL DATA:  83 year old female status post unwitnessed fall in bathroom. Found down. Pain. EXAM: PORTABLE CHEST 1 VIEW COMPARISON:  Chest radiographs 09/23/2016. FINDINGS: Portable AP supine view at 0919 hours. Lung volumes and mediastinal contours are within normal limits. Visualized  tracheal air column is within normal limits. Allowing for portable technique the lungs are clear. No pneumothorax or pleural effusion. No acute osseous abnormality identified. Negative visible bowel gas. IMPRESSION: No acute cardiopulmonary abnormality or acute traumatic injury identified. Electronically Signed   By: Odessa Fleming M.D.   On: 11/15/2023 09:46   DG Knee 2 Views Left Result Date:  11/15/2023 CLINICAL DATA:  83 year old female status post unwitnessed fall in bathroom. Found down. Pain. EXAM: LEFT KNEE - 1-2 VIEW COMPARISON:  None Available. FINDINGS: Two views at 0906 hours. Bone mineralization is within normal limits. Normal joint spaces and alignment for age. Patella appears intact. No evidence of joint effusion. No acute osseous abnormality identified. IMPRESSION: No acute fracture or dislocation identified about the left knee. Electronically Signed   By: Odessa Fleming M.D.   On: 11/15/2023 09:44   DG Wrist Complete Left Result Date: 11/15/2023 CLINICAL DATA:  83 year old female status post unwitnessed fall in bathroom. Found down. Pain. EXAM: LEFT WRIST - COMPLETE 3+ VIEW COMPARISON:  None Available. FINDINGS: Four views at 0856 hours. Bone mineralization is within normal limits for age. Comminuted and dorsally impacted distal left radius fracture. DRU and radiocarpal joint involvement. Unclear whether the ulnar styloid is intact but there is a small 6 mm fracture fragment at the distal ulna metadiaphysis. Generalized soft tissue swelling. Carpal bones appear to remain intact and aligned. No metacarpal fracture identified. IMPRESSION: 1. Comminuted and dorsally impacted distal left radius fracture. 2. Small fracture fragment at the distal ulna metadiaphysis. Electronically Signed   By: Odessa Fleming M.D.   On: 11/15/2023 09:44   DG Hip Unilat W or Wo Pelvis 2-3 Views Left Result Date: 11/15/2023 CLINICAL DATA:  83 year old female status post unwitnessed fall in bathroom. Found down. Pain. EXAM: DG HIP (WITH OR WITHOUT PELVIS) 2-3V LEFT COMPARISON:  None Available. FINDINGS: Three views at 0914 hours. Left femoral neck fracture with varus impaction. The intertrochanteric segment appears to remain intact. The left femoral head appears to remain normally located. No superimposed pelvis fracture identified. Grossly intact proximal right femur. Bone mineralization is within normal limits for age.  Symmetric SI joints. Nonobstructed bowel-gas pattern. IMPRESSION: Acute Left femoral neck fracture with varus impaction. Electronically Signed   By: Odessa Fleming M.D.   On: 11/15/2023 09:42   CT HEAD WO CONTRAST ( ) Result Date: 11/15/2023 CLINICAL DATA:  83 year old female status post unwitnessed fall in bathroom. Found down. Pain. EXAM: CT HEAD WITHOUT CONTRAST TECHNIQUE: Contiguous axial images were obtained from the base of the skull through the vertex without intravenous contrast. RADIATION DOSE REDUCTION: This exam was performed according to the departmental dose-optimization program which includes automated exposure control, adjustment of the mA and/or kV according to patient size and/or use of iterative reconstruction technique. COMPARISON:  Brain MRI 05/04/2015.  Head CT 08/13/2021. FINDINGS: Brain: Motion artifact throughout the posterior fossa. No evidence of intracranial mass effect or ventriculomegaly. No acute intracranial hemorrhage identified. No cortically based acute infarct identified. Vascular: Motion artifact at the skull base. Skull: Motion artifact throughout the skull base. No acute osseous abnormality identified. Sinuses/Orbits: Motion artifact. Other: Visualized scalp soft tissues are within normal limits. IMPRESSION: Motion artifact. No acute intracranial abnormality or acute traumatic injury identified. Electronically Signed   By: Odessa Fleming M.D.   On: 11/15/2023 09:41    Assessment/Plan Closed left hip fracture Closed left hip fracture sustained on November 15, 2023, requiring operative repair. Discharged on November 20, 2023. Post-operative care is ongoing. - Monitor  post-operative recovery.  Left wrist fracture Acute left wrist fracture reduced on November 15, 2023, with a splint. - Ensure splint remains in place.  Anemia Anemia likely related to recent injury and surgery. Iron supplementation is ongoing. - Repeat CBC to monitor hemoglobin levels. - Continue iron  supplementation.  Urinary retention Urinary retention managed with Foley catheter, replaced on November 17, 2023. Plan to trial voiding to assess for resolution. - Trial voiding on November 24, 2023.  Dementia with Anxiety Dementia with behavioral disturbance. Behavioral disturbance has improved, but confusion and disorientation persist. - Monitor cognitive status and behavioral symptoms.  Hypothyroidism Hypothyroidism with no specific issues discussed during this encounter. Continue Levothyroxine.   Arthritis Arthritis with no specific issues discussed during this encounter. Continue PRN tylenol  Goals of Care DNR (Do Not Resuscitate) status. No advanced care planning paperwork filed. Healthcare power of attorney may need activation. Daughter is out of town for a week and a half. - Contact daughter regarding code status and advanced care planning. - Consider activating healthcare power of attorney if necessary based on patient's current cognitive function.   Family/ staff Communication: nursing  Labs/tests ordered: CBC, BMP

## 2023-11-22 ENCOUNTER — Encounter: Payer: Self-pay | Admitting: Student

## 2023-11-22 DIAGNOSIS — F01B4 Vascular dementia, moderate, with anxiety: Secondary | ICD-10-CM | POA: Insufficient documentation

## 2023-11-24 LAB — BASIC METABOLIC PANEL WITH GFR
BUN: 14 (ref 4–21)
CO2: 28 — AB (ref 13–22)
Chloride: 101 (ref 99–108)
Creatinine: 0.4 — AB (ref 0.5–1.1)
Glucose: 86
Potassium: 3.9 meq/L (ref 3.5–5.1)
Sodium: 139 (ref 137–147)

## 2023-11-24 LAB — COMPREHENSIVE METABOLIC PANEL WITH GFR
Calcium: 8 — AB (ref 8.7–10.7)
eGFR: 98

## 2023-11-26 ENCOUNTER — Encounter: Payer: Self-pay | Admitting: Student

## 2023-11-26 ENCOUNTER — Non-Acute Institutional Stay (SKILLED_NURSING_FACILITY): Payer: Self-pay | Admitting: Student

## 2023-11-26 DIAGNOSIS — S62102S Fracture of unspecified carpal bone, left wrist, sequela: Secondary | ICD-10-CM | POA: Diagnosis not present

## 2023-11-26 DIAGNOSIS — T148XXA Other injury of unspecified body region, initial encounter: Secondary | ICD-10-CM | POA: Diagnosis not present

## 2023-11-26 NOTE — Progress Notes (Unsigned)
 Location:  Other [place].  Nursing Home Room Number: [facility] [address] Place of Service:  SNF [phone]) Provider:  Dr. Earnestine Mealing  PCP: Sherlene Shams, MD  Patient Care Team: Sherlene Shams, MD as PCP - General (Internal Medicine)  Extended Emergency Contact Information Primary Emergency Contact: [name],[name] Mobile Phone: [phone] Relation: Daughter  Code Status:  DNR Goals of care: Advanced Directive information    11/26/2023   11:16 AM  Advanced Directives  Does Patient Have a Medical Advance Directive? Yes  Type of Advance Directive Out of facility DNR (pink MOST or yellow form)  Does patient want to make changes to medical advance directive? No - Patient declined     Chief Complaint  Patient presents with   Hand Injury    Hand Injury    HPI:  Pt is a 83 y.o. female seen today for an acute visit for Hand Injury  Patient was seen in ortho clinic yesterday due to her removal of soft cast and concern for displaced bone. Edge of the cast is sharp and causing irritation to the hand.   Patient speaking nonsensical phrases, however, does exclaim "ouch" when touching the irritated area.   Past Medical History:  Diagnosis Date   Arthritis    Cancer (HCC)    SKIN CANCER-BASAL CELL   GERD (gastroesophageal reflux disease)    Hyperlipidemia    Hypothyroidism    Thyroid disease    Past Surgical History:  Procedure Laterality Date   APPENDECTOMY     CARPOMETACARPAL (CMC) FUSION OF THUMB Left 09/10/2017   Procedure: CARPOMETACARPAL (CMC) FUSION OF THUMB;  Surgeon: Deeann Saint, MD;  Location: [facility] ORS;  Service: Orthopedics;  Laterality: Left;   CLOSED REDUCTION WRIST FRACTURE Left 11/15/2023   Procedure: CLOSED REDUCTION, WRIST;  Surgeon: Deeann Saint, MD;  Location: [facility] ORS;  Service: Orthopedics;  Laterality: Left;   HIP ARTHROPLASTY Left 11/15/2023   Procedure: HEMIARTHROPLASTY (BIPOLAR) HIP, POSTERIOR APPROACH FOR FRACTURE;  Surgeon: Deeann Saint,  MD;  Location: [facility] ORS;  Service: Orthopedics;  Laterality: Left;   MELANOMA EXCISION     x 2, Dr. Jarold Motto    Allergies  Allergen Reactions   Hydrocodone     Hallucination   Shellfish Allergy Itching   Statins Other (See Comments)    Unknown    Penicillins Rash    Has patient had a PCN reaction causing immediate rash, facial/tongue/throat swelling, SOB or lightheadedness with hypotension: No Has patient had a PCN reaction causing severe rash involving mucus membranes or skin necrosis: No Has patient had a PCN reaction that required hospitalization: No Has patient had a PCN reaction occurring within the last 10 years: No If all of the above answers are "NO", then may proceed with Cephalosporin use.     Outpatient Encounter Medications as of 11/26/2023  Medication Sig   acetaminophen (TYLENOL) 325 MG tablet Take 2 tablets (650 mg total) by mouth every 6 (six) hours as needed for mild pain (pain score 1-3) or moderate pain (pain score 4-6) (or temp > 100.5).   aspirin EC 81 MG tablet Take 1 tablet (81 mg total) by mouth 2 (two) times daily. Swallow whole.   docusate sodium (COLACE) 100 MG capsule Take 1 capsule (100 mg total) by mouth 2 (two) times daily.   ferrous sulfate 325 (65 FE) MG tablet Take 1 tablet (325 mg total) by mouth daily with breakfast.   LORazepam (ATIVAN) 0.5 MG tablet Take 1 tablet (0.5 mg total) by mouth daily.  memantine (NAMENDA) 5 MG tablet Take 5 mg by mouth 2 (two) times daily.   nitrofurantoin, macrocrystal-monohydrate, (MACROBID) 100 MG capsule Take 100 mg by mouth daily.   Nutritional Supplements (ENSURE CLEAR PO) Take 1 Can by mouth daily.   Omega-3 Fatty Acids (OMEGA 3 PO) Take 30 mLs by mouth daily. 800 mg of Fish Oil  per serving   sertraline (ZOLOFT) 100 MG tablet Take 100 mg by mouth daily.   SYNTHROID 75 MCG tablet TAKE 1 TABLET BY MOUTH DAILY BEFORE BREAKFAST.   traMADol (ULTRAM) 50 MG tablet Take 1 tablet (50 mg total) by mouth every 8 (eight)  hours as needed for severe pain (pain score 7-10).   No facility-administered encounter medications on file as of 11/26/2023.    Review of Systems  Immunization History  Administered Date(s) Administered   Influenza Split 06/05/2011, 05/26/2012, 06/24/2013, 06/09/2014   Influenza, High Dose Seasonal PF 06/05/2019, 05/19/2020   Influenza-Unspecified 05/17/2015, 05/16/2016, 05/13/2018, 06/08/2018   Moderna Covid-19 Fall Seasonal Vaccine 28yrs & older 08/16/2022   PFIZER(Purple Top)SARS-COV-2 Vaccination 09/16/2019, 10/07/2019, 06/23/2020   PPD Test 04/17/2015, 02/01/2023   Pneumococcal Conjugate-13 06/16/2014   Pneumococcal Polysaccharide-23 07/11/2011, 04/14/2017, 11/22/2023   RSV,unspecified 11/24/2023   Tdap 07/14/2012, 08/13/2021   Zoster Recombinant(Shingrix) 11/19/2017, 02/23/2018   Zoster, Live 07/16/2010   Pertinent  Health Maintenance Due  Topic Date Due   MAMMOGRAM  04/06/2023   INFLUENZA VACCINE  03/26/2024   DEXA SCAN  Completed      01/01/2021   11:23 AM 08/13/2021    3:29 PM 02/05/2022    9:28 AM 03/29/2022    1:09 PM 01/16/2023   11:59 AM  Fall Risk  Falls in the past year? 0  0 1 0  Was there an injury with Fall? 0   0 0  Fall Risk Category Calculator 0   2 0  Fall Risk Category (Retired) Low   Moderate   (RETIRED) Patient Fall Risk Level Low fall risk High fall risk Low fall risk Moderate fall risk   Patient at Risk for Falls Due to    No Fall Risks;History of fall(s) No Fall Risks  Fall risk Follow up Falls evaluation completed  Falls evaluation completed Falls evaluation completed Falls evaluation completed   Functional Status Survey:    Vitals:   11/26/23 1109  BP: 130/72  Pulse: 80  Resp: 17  Temp: 97.7 F (36.5 C)  SpO2: 97%  Weight: 121 lb 3.2 oz (55 kg)  Height: 5\' 7"  (1.702 m)   Body mass index is 18.98 kg/m. Physical Exam Musculoskeletal:     Comments: Left forearm in hard cast covered in ace bandage    0.5 cm ulceration of with serous  drainage  Neurological:     Mental Status: She is alert.     Labs reviewed: Recent Labs    11/17/23 0523 11/18/23 0445 11/19/23 0459 11/24/23 0000  NA 134* 133* 137 139  K 2.9* 4.4 3.8 3.9  CL 99 102 107 101  CO2 26 25 27  28*  GLUCOSE 102* 133* 109*  --   BUN 17 21 16 14   CREATININE 0.65 0.56 0.55 0.4*  CALCIUM 8.4* 8.3* 8.1* 8.0*  MG 2.2  --   --   --    Recent Labs    11/15/23 0827  AST 20  ALT 16  ALKPHOS 60  BILITOT 0.8  PROT 6.8  ALBUMIN 4.0   Recent Labs    11/15/23 0827 11/16/23 0221 11/17/23 0523 11/18/23  0445 11/19/23 0459 11/20/23 0406  WBC 6.6   < > 11.7* 10.1 9.6  --   NEUTROABS 4.9  --   --   --   --   --   HGB 12.7   < > 10.0* 9.4* 8.7* 9.1*  HCT 38.1   < > 29.8* 27.3* 26.4*  --   MCV 94.1   < > 95.5 94.1 97.1  --   PLT 177   < > 122* 128* 150  --    < > = values in this interval not displayed.   Lab Results  Component Value Date   TSH 1.898 11/15/2023   No results found for: "HGBA1C" Lab Results  Component Value Date   CHOL 254 (H) 01/02/2021   HDL 68.10 01/02/2021   LDLCALC 171 (H) 01/02/2021   LDLDIRECT 172.6 09/08/2013   TRIG 76.0 01/02/2021   CHOLHDL 4 01/02/2021    Significant Diagnostic Results in last 30 days:  DG HIP UNILAT W OR W/O PELVIS 2-3 VIEWS LEFT Result Date: 11/15/2023 CLINICAL DATA:  Postoperative closed left hip fracture. EXAM: DG HIP (WITH OR WITHOUT PELVIS) 2-3V LEFT COMPARISON:  11/15/2023 FINDINGS: Interval postoperative changes with placement of a left hip hemiarthroplasty. Non cemented component appears well seated. No evidence of new acute fracture or dislocation. Soft tissue gas and skin clips are consistent with recent surgery. Visualized pelvis appears intact. Degenerative changes in the right hip. IMPRESSION: Postoperative left hip replacement with components appearing well seated. No acute complication is demonstrated radiographically. Electronically Signed   By: Burman Nieves M.D.   On: 11/15/2023  22:00   DG MINI C-ARM IMAGE ONLY Result Date: 11/15/2023 There is no interpretation for this exam.  This order is for images obtained during a surgical procedure.  Please See "Surgeries" Tab for more information regarding the procedure.   CT Cervical Spine Wo Contrast Result Date: 11/15/2023 CLINICAL DATA:  83 year old female status post unwitnessed fall in bathroom. Found down. Pain. EXAM: CT CERVICAL SPINE WITHOUT CONTRAST TECHNIQUE: Multidetector CT imaging of the cervical spine was performed without intravenous contrast. Multiplanar CT image reconstructions were also generated. RADIATION DOSE REDUCTION: This exam was performed according to the departmental dose-optimization program which includes automated exposure control, adjustment of the mA and/or kV according to patient size and/or use of iterative reconstruction technique. COMPARISON:  Head CT today.  Cervical spine CT 08/13/2021. FINDINGS: Alignment: Mildly improved cervical lordosis. Cervicothoracic junction alignment is within normal limits. Bilateral posterior element alignment is within normal limits. Skull base and vertebrae: Motion artifact although less pronounced than on the head CT today. Chronic TMJ degeneration. No skull base fracture identified. No evidence of atlanto-occipital dissociation. C1 and C2 appear aligned, grossly intact. Other cervical vertebrae appear intact. Soft tissues and spinal canal: No prevertebral fluid or swelling. No visible canal hematoma. Negative noncontrast neck soft tissues. Disc levels:  Mild for age cervical spine degeneration. Upper chest: Visible upper thoracic levels appear intact, upper lungs are clear. IMPRESSION: Mild motion artifact. No acute traumatic injury identified in the cervical spine. Electronically Signed   By: Odessa Fleming M.D.   On: 11/15/2023 09:50   DG Chest Portable 1 View Result Date: 11/15/2023 CLINICAL DATA:  83 year old female status post unwitnessed fall in bathroom. Found down. Pain.  EXAM: PORTABLE CHEST 1 VIEW COMPARISON:  Chest radiographs 09/23/2016. FINDINGS: Portable AP supine view at 0919 hours. Lung volumes and mediastinal contours are within normal limits. Visualized tracheal air column is within normal limits. Allowing for portable  technique the lungs are clear. No pneumothorax or pleural effusion. No acute osseous abnormality identified. Negative visible bowel gas. IMPRESSION: No acute cardiopulmonary abnormality or acute traumatic injury identified. Electronically Signed   By: Odessa Fleming M.D.   On: 11/15/2023 09:46   DG Knee 2 Views Left Result Date: 11/15/2023 CLINICAL DATA:  83 year old female status post unwitnessed fall in bathroom. Found down. Pain. EXAM: LEFT KNEE - 1-2 VIEW COMPARISON:  None Available. FINDINGS: Two views at 0906 hours. Bone mineralization is within normal limits. Normal joint spaces and alignment for age. Patella appears intact. No evidence of joint effusion. No acute osseous abnormality identified. IMPRESSION: No acute fracture or dislocation identified about the left knee. Electronically Signed   By: Odessa Fleming M.D.   On: 11/15/2023 09:44   DG Wrist Complete Left Result Date: 11/15/2023 CLINICAL DATA:  83 year old female status post unwitnessed fall in bathroom. Found down. Pain. EXAM: LEFT WRIST - COMPLETE 3+ VIEW COMPARISON:  None Available. FINDINGS: Four views at 0856 hours. Bone mineralization is within normal limits for age. Comminuted and dorsally impacted distal left radius fracture. DRU and radiocarpal joint involvement. Unclear whether the ulnar styloid is intact but there is a small 6 mm fracture fragment at the distal ulna metadiaphysis. Generalized soft tissue swelling. Carpal bones appear to remain intact and aligned. No metacarpal fracture identified. IMPRESSION: 1. Comminuted and dorsally impacted distal left radius fracture. 2. Small fracture fragment at the distal ulna metadiaphysis. Electronically Signed   By: Odessa Fleming M.D.   On: 11/15/2023  09:44   DG Hip Unilat W or Wo Pelvis 2-3 Views Left Result Date: 11/15/2023 CLINICAL DATA:  83 year old female status post unwitnessed fall in bathroom. Found down. Pain. EXAM: DG HIP (WITH OR WITHOUT PELVIS) 2-3V LEFT COMPARISON:  None Available. FINDINGS: Three views at 0914 hours. Left femoral neck fracture with varus impaction. The intertrochanteric segment appears to remain intact. The left femoral head appears to remain normally located. No superimposed pelvis fracture identified. Grossly intact proximal right femur. Bone mineralization is within normal limits for age. Symmetric SI joints. Nonobstructed bowel-gas pattern. IMPRESSION: Acute Left femoral neck fracture with varus impaction. Electronically Signed   By: Odessa Fleming M.D.   On: 11/15/2023 09:42   CT HEAD WO CONTRAST ( ) Result Date: 11/15/2023 CLINICAL DATA:  83 year old female status post unwitnessed fall in bathroom. Found down. Pain. EXAM: CT HEAD WITHOUT CONTRAST TECHNIQUE: Contiguous axial images were obtained from the base of the skull through the vertex without intravenous contrast. RADIATION DOSE REDUCTION: This exam was performed according to the departmental dose-optimization program which includes automated exposure control, adjustment of the mA and/or kV according to patient size and/or use of iterative reconstruction technique. COMPARISON:  Brain MRI 05/04/2015.  Head CT 08/13/2021. FINDINGS: Brain: Motion artifact throughout the posterior fossa. No evidence of intracranial mass effect or ventriculomegaly. No acute intracranial hemorrhage identified. No cortically based acute infarct identified. Vascular: Motion artifact at the skull base. Skull: Motion artifact throughout the skull base. No acute osseous abnormality identified. Sinuses/Orbits: Motion artifact. Other: Visualized scalp soft tissues are within normal limits. IMPRESSION: Motion artifact. No acute intracranial abnormality or acute traumatic injury identified.  Electronically Signed   By: Odessa Fleming M.D.   On: 11/15/2023 09:41    Assessment/Plan Left wrist fracture, sequela  Abrasion Re-wrapped patient's cast covering hard cast with ace bandage. Patient removes splints due to disorientation and baseline dementia. Double wrapped arm and secured in place with silk tape. At this time  inflammatory reaction from irritation/abrasion, will defer antibiotics. Continue wound changes q3 days. If no improvement or worsening, will consider return to ortho for new cast.   Family/ staff Communication: nursing  Labs/tests ordered:  none

## 2023-11-27 ENCOUNTER — Encounter: Payer: Self-pay | Admitting: Student

## 2023-12-01 ENCOUNTER — Non-Acute Institutional Stay (SKILLED_NURSING_FACILITY): Payer: Self-pay | Admitting: Student

## 2023-12-01 ENCOUNTER — Encounter: Payer: Self-pay | Admitting: Student

## 2023-12-01 DIAGNOSIS — S72002S Fracture of unspecified part of neck of left femur, sequela: Secondary | ICD-10-CM | POA: Diagnosis not present

## 2023-12-01 DIAGNOSIS — R410 Disorientation, unspecified: Secondary | ICD-10-CM | POA: Diagnosis not present

## 2023-12-01 DIAGNOSIS — F01B4 Vascular dementia, moderate, with anxiety: Secondary | ICD-10-CM | POA: Diagnosis not present

## 2023-12-01 DIAGNOSIS — S62102S Fracture of unspecified carpal bone, left wrist, sequela: Secondary | ICD-10-CM | POA: Diagnosis not present

## 2023-12-01 MED ORDER — TRAMADOL HCL 50 MG PO TABS: 50.0000 mg | ORAL_TABLET | Freq: Three times a day (TID) | ORAL | 0 refills | Status: DC | PRN

## 2023-12-01 NOTE — Progress Notes (Signed)
 Location:  Other Nursing Home Room Number: [name] Place of Service:  SNF [phone]) Provider:  Lindalou Hose, Mar Daring, MD  Patient Care Team: Sherlene Shams, MD as PCP - General (Internal Medicine)  Extended Emergency Contact Information Primary Emergency Contact: [name],[name] Mobile Phone: [phone] Relation: Daughter  Code Status:  DNR Goals of care: Advanced Directive information    11/26/2023   11:16 AM  Advanced Directives  Does Patient Have a Medical Advance Directive? Yes  Type of Advance Directive Out of facility DNR (pink MOST or yellow form)  Does patient want to make changes to medical advance directive? No - Patient declined     Chief Complaint  Patient presents with   Fall    HPI:   Discussed the use of AI scribe software for clinical note transcription with the patient, who gave verbal consent to proceed.  History of Present Illness  The patient is an 83 year old with dementia who presents with increased confusion and agitation. She is accompanied by her daughter, Samantha Hanna, who is her primary caregiver.  She has a long-standing history of dementia, progressively worsening over the years. Her daughter reports increased confusion, agitation, and fidgetiness, particularly at night. She experiences hallucinations, such as seeing people and animals that are not present. Her dementia symptoms have been present since her sixties, with early signs including confusion, forgetfulness, and difficulty recalling recent events or familiar people. Her condition significantly worsened after the death of her husband in December 13, 2021, leading to increased wandering and confusion and ultimately a move to memory care unit. She has been on the wait list for a place at [facility].   She has a history of falls, the most recent resulting in a hip fracture. Her daughter reports that she has been very agitated, removing her cast and fidgeting with her leg.  She experiences sleep  disturbances, being awake at night and sleeping during the day. Her current medications include lorazepam, taken in the morning, and Zoloft. She was previously on melatonin at [facility].  Her nutritional intake has decreased, and she has lost weight, dropping from 123 pounds to 120 pounds recently. She is not eating well and requires assistance with feeding, which is a new development since her recent accident. Her daughter notes that she used to enjoy ice cream but now refuses it, which is concerning given her previous fondness for it.  The family history is significant for dementia, as her mother also had the condition. Her daughter expresses concern about the genetic aspect of dementia, noting that it runs in the family.    Past Medical History:  Diagnosis Date   Arthritis    Cancer (HCC)    SKIN CANCER-BASAL CELL   GERD (gastroesophageal reflux disease)    Hyperlipidemia    Hypothyroidism    Thyroid disease    Past Surgical History:  Procedure Laterality Date   APPENDECTOMY     CARPOMETACARPAL (CMC) FUSION OF THUMB Left 09/10/2017   Procedure: CARPOMETACARPAL (CMC) FUSION OF THUMB;  Surgeon: Deeann Saint, MD;  Location: [facility] ORS;  Service: Orthopedics;  Laterality: Left;   CLOSED REDUCTION WRIST FRACTURE Left 11/15/2023   Procedure: CLOSED REDUCTION, WRIST;  Surgeon: Deeann Saint, MD;  Location: [facility] ORS;  Service: Orthopedics;  Laterality: Left;   HIP ARTHROPLASTY Left 11/15/2023   Procedure: HEMIARTHROPLASTY (BIPOLAR) HIP, POSTERIOR APPROACH FOR FRACTURE;  Surgeon: Deeann Saint, MD;  Location: [facility] ORS;  Service: Orthopedics;  Laterality: Left;   MELANOMA EXCISION  x 2, Dr. Jarold Motto    Allergies  Allergen Reactions   Hydrocodone     Hallucination   Shellfish Allergy Itching   Statins Other (See Comments)    Unknown    Penicillins Rash    Has patient had a PCN reaction causing immediate rash, facial/tongue/throat swelling, SOB or lightheadedness with hypotension:  No Has patient had a PCN reaction causing severe rash involving mucus membranes or skin necrosis: No Has patient had a PCN reaction that required hospitalization: No Has patient had a PCN reaction occurring within the last 10 years: No If all of the above answers are "NO", then may proceed with Cephalosporin use.     Outpatient Encounter Medications as of 12/01/2023  Medication Sig   acetaminophen (TYLENOL) 325 MG tablet Take 2 tablets (650 mg total) by mouth every 6 (six) hours as needed for mild pain (pain score 1-3) or moderate pain (pain score 4-6) (or temp > 100.5).   aspirin EC 81 MG tablet Take 1 tablet (81 mg total) by mouth 2 (two) times daily. Swallow whole.   docusate sodium (COLACE) 100 MG capsule Take 1 capsule (100 mg total) by mouth 2 (two) times daily.   ferrous sulfate 325 (65 FE) MG tablet Take 1 tablet (325 mg total) by mouth daily with breakfast.   LORazepam (ATIVAN) 0.5 MG tablet Take 1 tablet (0.5 mg total) by mouth daily.   memantine (NAMENDA) 5 MG tablet Take 5 mg by mouth 2 (two) times daily.   nitrofurantoin, macrocrystal-monohydrate, (MACROBID) 100 MG capsule Take 100 mg by mouth daily.   Nutritional Supplements (ENSURE CLEAR PO) Take 1 Can by mouth daily.   Omega-3 Fatty Acids (OMEGA 3 PO) Take 30 mLs by mouth daily. 800 mg of Fish Oil  per serving   sertraline (ZOLOFT) 100 MG tablet Take 100 mg by mouth daily.   SYNTHROID 75 MCG tablet TAKE 1 TABLET BY MOUTH DAILY BEFORE BREAKFAST.   traMADol (ULTRAM) 50 MG tablet Take 1 tablet (50 mg total) by mouth every 8 (eight) hours as needed for severe pain (pain score 7-10).   No facility-administered encounter medications on file as of 12/01/2023.    Review of Systems  Immunization History  Administered Date(s) Administered   Influenza Split 06/05/2011, 05/26/2012, 06/24/2013, 06/09/2014   Influenza, High Dose Seasonal PF 06/05/2019, 05/19/2020   Influenza-Unspecified 05/17/2015, 05/16/2016, 05/13/2018, 06/08/2018    Moderna Covid-19 Fall Seasonal Vaccine 22yrs & older 08/16/2022   PFIZER(Purple Top)SARS-COV-2 Vaccination 09/16/2019, 10/07/2019, 06/23/2020   PPD Test 04/17/2015, 02/01/2023   Pneumococcal Conjugate-13 06/16/2014   Pneumococcal Polysaccharide-23 07/11/2011, 04/14/2017, 11/22/2023   RSV,unspecified 11/24/2023   Tdap 07/14/2012, 08/13/2021   Zoster Recombinant(Shingrix) 11/19/2017, 02/23/2018   Zoster, Live 07/16/2010   Pertinent  Health Maintenance Due  Topic Date Due   MAMMOGRAM  04/06/2023   INFLUENZA VACCINE  03/26/2024   DEXA SCAN  Completed      01/01/2021   11:23 AM 08/13/2021    3:29 PM 02/05/2022    9:28 AM 03/29/2022    1:09 PM 01/16/2023   11:59 AM  Fall Risk  Falls in the past year? 0  0 1 0  Was there an injury with Fall? 0   0 0  Fall Risk Category Calculator 0   2 0  Fall Risk Category (Retired) Low   Moderate   (RETIRED) Patient Fall Risk Level Low fall risk High fall risk Low fall risk Moderate fall risk   Patient at Risk for Falls Due to  No Fall Risks;History of fall(s) No Fall Risks  Fall risk Follow up Falls evaluation completed  Falls evaluation completed Falls evaluation completed Falls evaluation completed   Functional Status Survey:    Vitals:   12/01/23 0940  BP: (!) 106/56  Pulse: 75  Resp: 17  Temp: (!) 97.4 F (36.3 C)  SpO2: 98%  Weight: 120 lb (54.4 kg)   Body mass index is 18.79 kg/m. Physical Exam Cardiovascular:     Rate and Rhythm: Normal rate.     Pulses: Normal pulses.  Pulmonary:     Effort: Pulmonary effort is normal.  Abdominal:     General: Abdomen is flat.     Palpations: Abdomen is soft.  Skin:    General: Skin is warm.  Neurological:     Mental Status: She is alert. Mental status is at baseline. She is disoriented.     Labs reviewed: Recent Labs    11/17/23 0523 11/18/23 0445 11/19/23 0459 11/24/23 0000  NA 134* 133* 137 139  K 2.9* 4.4 3.8 3.9  CL 99 102 107 101  CO2 26 25 27  28*  GLUCOSE 102* 133* 109*   --   BUN 17 21 16 14   CREATININE 0.65 0.56 0.55 0.4*  CALCIUM 8.4* 8.3* 8.1* 8.0*  MG 2.2  --   --   --    Recent Labs    11/15/23 0827  AST 20  ALT 16  ALKPHOS 60  BILITOT 0.8  PROT 6.8  ALBUMIN 4.0   Recent Labs    11/15/23 0827 11/16/23 0221 11/17/23 0523 11/18/23 0445 11/19/23 0459 11/20/23 0406  WBC 6.6   < > 11.7* 10.1 9.6  --   NEUTROABS 4.9  --   --   --   --   --   HGB 12.7   < > 10.0* 9.4* 8.7* 9.1*  HCT 38.1   < > 29.8* 27.3* 26.4*  --   MCV 94.1   < > 95.5 94.1 97.1  --   PLT 177   < > 122* 128* 150  --    < > = values in this interval not displayed.   Lab Results  Component Value Date   TSH 1.898 11/15/2023   No results found for: "HGBA1C" Lab Results  Component Value Date   CHOL 254 (H) 01/02/2021   HDL 68.10 01/02/2021   LDLCALC 171 (H) 01/02/2021   LDLDIRECT 172.6 09/08/2013   TRIG 76.0 01/02/2021   CHOLHDL 4 01/02/2021    Significant Diagnostic Results in last 30 days:  DG HIP UNILAT W OR W/O PELVIS 2-3 VIEWS LEFT Result Date: 11/15/2023 CLINICAL DATA:  Postoperative closed left hip fracture. EXAM: DG HIP (WITH OR WITHOUT PELVIS) 2-3V LEFT COMPARISON:  11/15/2023 FINDINGS: Interval postoperative changes with placement of a left hip hemiarthroplasty. Non cemented component appears well seated. No evidence of new acute fracture or dislocation. Soft tissue gas and skin clips are consistent with recent surgery. Visualized pelvis appears intact. Degenerative changes in the right hip. IMPRESSION: Postoperative left hip replacement with components appearing well seated. No acute complication is demonstrated radiographically. Electronically Signed   By: Burman Nieves M.D.   On: 11/15/2023 22:00   DG MINI C-ARM IMAGE ONLY Result Date: 11/15/2023 There is no interpretation for this exam.  This order is for images obtained during a surgical procedure.  Please See "Surgeries" Tab for more information regarding the procedure.   CT Cervical Spine Wo  Contrast Result Date: 11/15/2023 CLINICAL DATA:  83 year old female status post unwitnessed fall in bathroom. Found down. Pain. EXAM: CT CERVICAL SPINE WITHOUT CONTRAST TECHNIQUE: Multidetector CT imaging of the cervical spine was performed without intravenous contrast. Multiplanar CT image reconstructions were also generated. RADIATION DOSE REDUCTION: This exam was performed according to the departmental dose-optimization program which includes automated exposure control, adjustment of the mA and/or kV according to patient size and/or use of iterative reconstruction technique. COMPARISON:  Head CT today.  Cervical spine CT 08/13/2021. FINDINGS: Alignment: Mildly improved cervical lordosis. Cervicothoracic junction alignment is within normal limits. Bilateral posterior element alignment is within normal limits. Skull base and vertebrae: Motion artifact although less pronounced than on the head CT today. Chronic TMJ degeneration. No skull base fracture identified. No evidence of atlanto-occipital dissociation. C1 and C2 appear aligned, grossly intact. Other cervical vertebrae appear intact. Soft tissues and spinal canal: No prevertebral fluid or swelling. No visible canal hematoma. Negative noncontrast neck soft tissues. Disc levels:  Mild for age cervical spine degeneration. Upper chest: Visible upper thoracic levels appear intact, upper lungs are clear. IMPRESSION: Mild motion artifact. No acute traumatic injury identified in the cervical spine. Electronically Signed   By: Odessa Fleming M.D.   On: 11/15/2023 09:50   DG Chest Portable 1 View Result Date: 11/15/2023 CLINICAL DATA:  83 year old female status post unwitnessed fall in bathroom. Found down. Pain. EXAM: PORTABLE CHEST 1 VIEW COMPARISON:  Chest radiographs 09/23/2016. FINDINGS: Portable AP supine view at 0919 hours. Lung volumes and mediastinal contours are within normal limits. Visualized tracheal air column is within normal limits. Allowing for portable  technique the lungs are clear. No pneumothorax or pleural effusion. No acute osseous abnormality identified. Negative visible bowel gas. IMPRESSION: No acute cardiopulmonary abnormality or acute traumatic injury identified. Electronically Signed   By: Odessa Fleming M.D.   On: 11/15/2023 09:46   DG Knee 2 Views Left Result Date: 11/15/2023 CLINICAL DATA:  83 year old female status post unwitnessed fall in bathroom. Found down. Pain. EXAM: LEFT KNEE - 1-2 VIEW COMPARISON:  None Available. FINDINGS: Two views at 0906 hours. Bone mineralization is within normal limits. Normal joint spaces and alignment for age. Patella appears intact. No evidence of joint effusion. No acute osseous abnormality identified. IMPRESSION: No acute fracture or dislocation identified about the left knee. Electronically Signed   By: Odessa Fleming M.D.   On: 11/15/2023 09:44   DG Wrist Complete Left Result Date: 11/15/2023 CLINICAL DATA:  83 year old female status post unwitnessed fall in bathroom. Found down. Pain. EXAM: LEFT WRIST - COMPLETE 3+ VIEW COMPARISON:  None Available. FINDINGS: Four views at 0856 hours. Bone mineralization is within normal limits for age. Comminuted and dorsally impacted distal left radius fracture. DRU and radiocarpal joint involvement. Unclear whether the ulnar styloid is intact but there is a small 6 mm fracture fragment at the distal ulna metadiaphysis. Generalized soft tissue swelling. Carpal bones appear to remain intact and aligned. No metacarpal fracture identified. IMPRESSION: 1. Comminuted and dorsally impacted distal left radius fracture. 2. Small fracture fragment at the distal ulna metadiaphysis. Electronically Signed   By: Odessa Fleming M.D.   On: 11/15/2023 09:44   DG Hip Unilat W or Wo Pelvis 2-3 Views Left Result Date: 11/15/2023 CLINICAL DATA:  83 year old female status post unwitnessed fall in bathroom. Found down. Pain. EXAM: DG HIP (WITH OR WITHOUT PELVIS) 2-3V LEFT COMPARISON:  None Available. FINDINGS:  Three views at 0914 hours. Left femoral neck fracture with varus impaction. The intertrochanteric segment appears to remain intact.  The left femoral head appears to remain normally located. No superimposed pelvis fracture identified. Grossly intact proximal right femur. Bone mineralization is within normal limits for age. Symmetric SI joints. Nonobstructed bowel-gas pattern. IMPRESSION: Acute Left femoral neck fracture with varus impaction. Electronically Signed   By: Odessa Fleming M.D.   On: 11/15/2023 09:42   CT HEAD WO CONTRAST ( ) Result Date: 11/15/2023 CLINICAL DATA:  83 year old female status post unwitnessed fall in bathroom. Found down. Pain. EXAM: CT HEAD WITHOUT CONTRAST TECHNIQUE: Contiguous axial images were obtained from the base of the skull through the vertex without intravenous contrast. RADIATION DOSE REDUCTION: This exam was performed according to the departmental dose-optimization program which includes automated exposure control, adjustment of the mA and/or kV according to patient size and/or use of iterative reconstruction technique. COMPARISON:  Brain MRI 05/04/2015.  Head CT 08/13/2021. FINDINGS: Brain: Motion artifact throughout the posterior fossa. No evidence of intracranial mass effect or ventriculomegaly. No acute intracranial hemorrhage identified. No cortically based acute infarct identified. Vascular: Motion artifact at the skull base. Skull: Motion artifact throughout the skull base. No acute osseous abnormality identified. Sinuses/Orbits: Motion artifact. Other: Visualized scalp soft tissues are within normal limits. IMPRESSION: Motion artifact. No acute intracranial abnormality or acute traumatic injury identified. Electronically Signed   By: Odessa Fleming M.D.   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 Her condition has  worsened following her husband's death, and she resides in a skilled nursing facility. There is a family history of dementia. She has experienced significant weight loss and is unable to feed herself. The prognosis is poor, with a life expectancy of 8-10 years from diagnosis, and further decline is expected without significant improvement in the next three weeks. - Continue skilled nursing care - Monitor weight and nutritional intake - Consider hospice and palliative care if no improvement in three weeks - consider discontinuation of namenda given associated weight loss with this medication.   Delirium She exhibits hyperactive delirium, including confusion, fidgetiness, and hallucinations, likely exacerbated by a disrupted sleep-wake cycle and recent environmental changes. Ativan is not preferred due to risks of dependence, increased confusion, and falls in those over 65. Trazodone is preferred to aid sleep and reduce antipsychotic use. Mirtazapine is considered if weight gain and mood improvement are needed. - Initially discussed trazodone, however given poor PO intake, will start mirtazapine at night to aid sleep - Switch lorazepam to evening at 0.5 mg nightly x2 weeks and reduce dose to 0.25 mg nightly x2 weeks then make PRN x2 weeks  then stop.  - Taper lorazepam over four weeks - Add melatonin 3-5 mg at night  Hip Fracture She sustained a hip fracture, which is a significant concern given her age and cognitive status. The focus is on preventing further falls and ensuring proper healing. Anesthesia and surgery pose significant risks of prolonged delirium and further cognitive decline. - Monitor for signs of pain or complications - Ensure safe environment to prevent falls - Weight recliners to prevent tipping  Wrist Fracture She has a wrist fracture, but it is not the primary concern compared to her hip fracture. The wrist fracture is not significantly impacting her current hand function.  Immobilization attempts have been poorly tolerated, and healing is expected without perfect alignment. - Consult with orthopedic surgeon regarding management - Consider no cast if not tolerated  Goals of Care  She has a DNR order in place. There is a preference to avoid hospitalization unless absolutely necessary, and to manage conditions in the skilled nursing facility as much as possible. The family understands the prognosis and prefers to focus on comfort and quality of life, avoiding interventions that may cause further decline. - Maintain DNR order - Avoid hospitalization unless necessary - Provide in-house care for treatable conditions  General Health Maintenance She is up to date on vaccinations, including pneumonia booster. There is a preference for in-house care for dental and other routine health services to minimize stress and disruption. - Ensure consent for in-house dental and dermatology services - Maintain vaccination schedule  Family/ staff Communication: Nursing, daughter/HCPOA  Labs/tests ordered:  none  I spent greater than 15 minutes for the care of this patient in face to face time, chart review, clinical documentation, patient education. I spent an additional 60 minutes discussing goals of care and advanced care planning.

## 2023-12-02 ENCOUNTER — Other Ambulatory Visit: Payer: Self-pay | Admitting: Nurse Practitioner

## 2023-12-02 DIAGNOSIS — S72002S Fracture of unspecified part of neck of left femur, sequela: Secondary | ICD-10-CM

## 2023-12-02 MED ORDER — TRAMADOL HCL 50 MG PO TABS: 50.0000 mg | ORAL_TABLET | Freq: Three times a day (TID) | ORAL | 0 refills | Status: DC | PRN

## 2023-12-05 ENCOUNTER — Encounter: Payer: Self-pay | Admitting: Student

## 2023-12-05 ENCOUNTER — Non-Acute Institutional Stay (SKILLED_NURSING_FACILITY): Payer: Self-pay | Admitting: Student

## 2023-12-05 DIAGNOSIS — Z7189 Other specified counseling: Secondary | ICD-10-CM | POA: Diagnosis not present

## 2023-12-05 DIAGNOSIS — M978XXA Periprosthetic fracture around other internal prosthetic joint, initial encounter: Secondary | ICD-10-CM

## 2023-12-05 DIAGNOSIS — F03918 Unspecified dementia, unspecified severity, with other behavioral disturbance: Secondary | ICD-10-CM | POA: Diagnosis not present

## 2023-12-05 DIAGNOSIS — Z96649 Presence of unspecified artificial hip joint: Secondary | ICD-10-CM | POA: Diagnosis not present

## 2023-12-05 MED ORDER — OXYCODONE HCL 5 MG PO TABS: 5.0000 mg | ORAL_TABLET | Freq: Four times a day (QID) | ORAL | 0 refills | Status: AC

## 2023-12-05 MED ORDER — OXYCODONE HCL 5 MG PO TABS: 5.0000 mg | ORAL_TABLET | Freq: Four times a day (QID) | ORAL | 0 refills | Status: DC | PRN

## 2023-12-05 MED ORDER — MORPHINE SULFATE (CONCENTRATE) 20 MG/ML PO SOLN: 5.0000 mg | ORAL | 0 refills | Status: DC | PRN

## 2023-12-05 NOTE — Addendum Note (Signed)
 Addended by: Earnestine Mealing on: 12/05/2023 05:19 PM   Modules accepted: Orders

## 2023-12-05 NOTE — Progress Notes (Addendum)
 Location:  Other [place].  Nursing Home Room Number: [facility] [address]A Place of Service:  SNF [phone]) Provider:  Dr. Earnestine Mealing  PCP: Sherlene Shams, MD  Patient Care Team: Sherlene Shams, MD as PCP - General (Internal Medicine)  Extended Emergency Contact Information Primary Emergency Contact: [name],[name] Mobile Phone: [phone] Relation: Daughter  Code Status:  DNR Goals of care: Advanced Directive information    12/05/2023    9:20 AM  Advanced Directives  Does Patient Have a Medical Advance Directive? Yes  Type of Advance Directive Out of facility DNR (pink MOST or yellow form)  Does patient want to make changes to medical advance directive? No - Patient declined     Chief Complaint  Patient presents with   Fall    Fall    HPI:  Pt is a 83 y.o. female seen today for an acute visit for Fall  History of Present Illness The patient presents with pain management following a periprosthetic fracture. She is accompanied by her niece, Baird Lyons. She recently met with Dr. Hyacinth Meeker for evaluation of the fracture.  She has a periprosthetic fracture causing significant pain, particularly during movement. The fracture was evaluated by Dr. Hyacinth Meeker, and there is uncertainty about the need for surgical intervention. Her pain is described as severe, especially when moving.  She has been prescribed tramadol for pain management, but there is a discussion about the need for stronger pain medication. Options such as oxycodone and morphine are being considered to manage her pain effectively.  There is a discussion about her ability to bear weight and the potential for physical therapy. She had a good day recently where she was able to take some steps with a walker, but her condition has since declined.  She has experienced weight loss and poor eating, which are contributing to her overall condition. Her niece, Baird Lyons, is noted to be the only one who can encourage her to  eat.  There is a mention of a recent fall, which has impacted her mobility and prognosis.   Past Medical History:  Diagnosis Date   Arthritis    Cancer (HCC)    SKIN CANCER-BASAL CELL   GERD (gastroesophageal reflux disease)    Hyperlipidemia    Hypothyroidism    Thyroid disease    Past Surgical History:  Procedure Laterality Date   APPENDECTOMY     CARPOMETACARPAL (CMC) FUSION OF THUMB Left 09/10/2017   Procedure: CARPOMETACARPAL (CMC) FUSION OF THUMB;  Surgeon: Deeann Saint, MD;  Location: [facility] ORS;  Service: Orthopedics;  Laterality: Left;   CLOSED REDUCTION WRIST FRACTURE Left 11/15/2023   Procedure: CLOSED REDUCTION, WRIST;  Surgeon: Deeann Saint, MD;  Location: [facility] ORS;  Service: Orthopedics;  Laterality: Left;   HIP ARTHROPLASTY Left 11/15/2023   Procedure: HEMIARTHROPLASTY (BIPOLAR) HIP, POSTERIOR APPROACH FOR FRACTURE;  Surgeon: Deeann Saint, MD;  Location: [facility] ORS;  Service: Orthopedics;  Laterality: Left;   MELANOMA EXCISION     x 2, Dr. Jarold Motto    Allergies  Allergen Reactions   Hydrocodone     Hallucination   Shellfish Allergy Itching   Statins Other (See Comments)    Unknown    Penicillins Rash    Has patient had a PCN reaction causing immediate rash, facial/tongue/throat swelling, SOB or lightheadedness with hypotension: No Has patient had a PCN reaction causing severe rash involving mucus membranes or skin necrosis: No Has patient had a PCN reaction that required hospitalization: No Has patient had a PCN reaction occurring within the  last 10 years: No If all of the above answers are "NO", then may proceed with Cephalosporin use.     Outpatient Encounter Medications as of 12/05/2023  Medication Sig   acetaminophen (TYLENOL) 325 MG tablet Take 2 tablets (650 mg total) by mouth every 6 (six) hours as needed for mild pain (pain score 1-3) or moderate pain (pain score 4-6) (or temp > 100.5).   aspirin EC 81 MG tablet Take 1 tablet (81 mg total) by mouth  2 (two) times daily. Swallow whole.   docusate sodium (COLACE) 100 MG capsule Take 1 capsule (100 mg total) by mouth 2 (two) times daily.   ferrous sulfate 325 (65 FE) MG tablet Take 1 tablet (325 mg total) by mouth daily with breakfast.   LORazepam (ATIVAN) 0.5 MG tablet Take 1 tablet (0.5 mg total) by mouth daily.   memantine (NAMENDA) 5 MG tablet Take 5 mg by mouth 2 (two) times daily.   mirtazapine (REMERON) 7.5 MG tablet Take 7.5 mg by mouth at bedtime.   morphine (ROXANOL) 20 MG/ML concentrated solution Take 0.25 mLs (5 mg total) by mouth every 2 (two) hours as needed for severe pain (pain score 7-10) or breakthrough pain.   nitrofurantoin, macrocrystal-monohydrate, (MACROBID) 100 MG capsule Take 100 mg by mouth daily.   Nutritional Supplements (ENSURE CLEAR PO) Take 1 Can by mouth daily.   Omega-3 Fatty Acids (OMEGA 3 PO) Take 30 mLs by mouth daily. 800 mg of Fish Oil  per serving   sertraline (ZOLOFT) 100 MG tablet Take 100 mg by mouth daily.   SYNTHROID 75 MCG tablet TAKE 1 TABLET BY MOUTH DAILY BEFORE BREAKFAST.   traMADol (ULTRAM) 50 MG tablet Take 1 tablet (50 mg total) by mouth every 8 (eight) hours as needed for severe pain (pain score 7-10).   [DISCONTINUED] oxyCODONE (ROXICODONE) 5 MG immediate release tablet Take 1 tablet (5 mg total) by mouth every 6 (six) hours as needed for severe pain (pain score 7-10).   oxyCODONE (ROXICODONE) 5 MG immediate release tablet Take 1 tablet (5 mg total) by mouth every 6 (six) hours for 7 days.   No facility-administered encounter medications on file as of 12/05/2023.    Review of Systems  Immunization History  Administered Date(s) Administered   Influenza Split 06/05/2011, 05/26/2012, 06/24/2013, 06/09/2014   Influenza, High Dose Seasonal PF 06/05/2019, 05/19/2020   Influenza-Unspecified 05/17/2015, 05/16/2016, 05/13/2018, 06/08/2018   Moderna Covid-19 Fall Seasonal Vaccine 7yrs & older 08/16/2022   PFIZER(Purple Top)SARS-COV-2  Vaccination 09/16/2019, 10/07/2019, 06/23/2020   PPD Test 04/17/2015, 02/01/2023   Pneumococcal Conjugate-13 06/16/2014   Pneumococcal Polysaccharide-23 07/11/2011, 04/14/2017, 11/22/2023   RSV,unspecified 11/24/2023   Tdap 07/14/2012, 08/13/2021   Zoster Recombinant(Shingrix) 11/19/2017, 02/23/2018   Zoster, Live 07/16/2010   Pertinent  Health Maintenance Due  Topic Date Due   MAMMOGRAM  04/06/2023   INFLUENZA VACCINE  03/26/2024   DEXA SCAN  Completed      01/01/2021   11:23 AM 08/13/2021    3:29 PM 02/05/2022    9:28 AM 03/29/2022    1:09 PM 01/16/2023   11:59 AM  Fall Risk  Falls in the past year? 0  0 1 0  Was there an injury with Fall? 0   0 0  Fall Risk Category Calculator 0   2 0  Fall Risk Category (Retired) Low   Moderate   (RETIRED) Patient Fall Risk Level Low fall risk High fall risk Low fall risk Moderate fall risk   Patient at Risk  for Falls Due to    No Fall Risks;History of fall(s) No Fall Risks  Fall risk Follow up Falls evaluation completed  Falls evaluation completed Falls evaluation completed Falls evaluation completed   Functional Status Survey:    Vitals:   12/05/23 0913  BP: 117/79  Pulse: 74  Resp: 18  Temp: (!) 97.2 F (36.2 C)  SpO2: 96%  Weight: 115 lb 3.2 oz (52.3 kg)  Height: 5\' 7"  (1.702 m)   Body mass index is 18.04 kg/m. Physical Exam   Labs reviewed: Recent Labs    11/17/23 0523 11/18/23 0445 11/19/23 0459 11/24/23 0000  NA 134* 133* 137 139  K 2.9* 4.4 3.8 3.9  CL 99 102 107 101  CO2 26 25 27  28*  GLUCOSE 102* 133* 109*  --   BUN 17 21 16 14   CREATININE 0.65 0.56 0.55 0.4*  CALCIUM 8.4* 8.3* 8.1* 8.0*  MG 2.2  --   --   --    Recent Labs    11/15/23 0827  AST 20  ALT 16  ALKPHOS 60  BILITOT 0.8  PROT 6.8  ALBUMIN 4.0   Recent Labs    11/15/23 0827 11/16/23 0221 11/17/23 0523 11/18/23 0445 11/19/23 0459 11/20/23 0406  WBC 6.6   < > 11.7* 10.1 9.6  --   NEUTROABS 4.9  --   --   --   --   --   HGB 12.7   <  > 10.0* 9.4* 8.7* 9.1*  HCT 38.1   < > 29.8* 27.3* 26.4*  --   MCV 94.1   < > 95.5 94.1 97.1  --   PLT 177   < > 122* 128* 150  --    < > = values in this interval not displayed.   Lab Results  Component Value Date   TSH 1.898 11/15/2023   No results found for: "HGBA1C" Lab Results  Component Value Date   CHOL 254 (H) 01/02/2021   HDL 68.10 01/02/2021   LDLCALC 171 (H) 01/02/2021   LDLDIRECT 172.6 09/08/2013   TRIG 76.0 01/02/2021   CHOLHDL 4 01/02/2021    Significant Diagnostic Results in last 30 days:  DG HIP UNILAT W OR W/O PELVIS 2-3 VIEWS LEFT Result Date: 11/15/2023 CLINICAL DATA:  Postoperative closed left hip fracture. EXAM: DG HIP (WITH OR WITHOUT PELVIS) 2-3V LEFT COMPARISON:  11/15/2023 FINDINGS: Interval postoperative changes with placement of a left hip hemiarthroplasty. Non cemented component appears well seated. No evidence of new acute fracture or dislocation. Soft tissue gas and skin clips are consistent with recent surgery. Visualized pelvis appears intact. Degenerative changes in the right hip. IMPRESSION: Postoperative left hip replacement with components appearing well seated. No acute complication is demonstrated radiographically. Electronically Signed   By: Burman Nieves M.D.   On: 11/15/2023 22:00   DG MINI C-ARM IMAGE ONLY Result Date: 11/15/2023 There is no interpretation for this exam.  This order is for images obtained during a surgical procedure.  Please See "Surgeries" Tab for more information regarding the procedure.   CT Cervical Spine Wo Contrast Result Date: 11/15/2023 CLINICAL DATA:  83 year old female status post unwitnessed fall in bathroom. Found down. Pain. EXAM: CT CERVICAL SPINE WITHOUT CONTRAST TECHNIQUE: Multidetector CT imaging of the cervical spine was performed without intravenous contrast. Multiplanar CT image reconstructions were also generated. RADIATION DOSE REDUCTION: This exam was performed according to the departmental  dose-optimization program which includes automated exposure control, adjustment of the mA and/or kV according  to patient size and/or use of iterative reconstruction technique. COMPARISON:  Head CT today.  Cervical spine CT 08/13/2021. FINDINGS: Alignment: Mildly improved cervical lordosis. Cervicothoracic junction alignment is within normal limits. Bilateral posterior element alignment is within normal limits. Skull base and vertebrae: Motion artifact although less pronounced than on the head CT today. Chronic TMJ degeneration. No skull base fracture identified. No evidence of atlanto-occipital dissociation. C1 and C2 appear aligned, grossly intact. Other cervical vertebrae appear intact. Soft tissues and spinal canal: No prevertebral fluid or swelling. No visible canal hematoma. Negative noncontrast neck soft tissues. Disc levels:  Mild for age cervical spine degeneration. Upper chest: Visible upper thoracic levels appear intact, upper lungs are clear. IMPRESSION: Mild motion artifact. No acute traumatic injury identified in the cervical spine. Electronically Signed   By: Odessa Fleming M.D.   On: 11/15/2023 09:50   DG Chest Portable 1 View Result Date: 11/15/2023 CLINICAL DATA:  83 year old female status post unwitnessed fall in bathroom. Found down. Pain. EXAM: PORTABLE CHEST 1 VIEW COMPARISON:  Chest radiographs 09/23/2016. FINDINGS: Portable AP supine view at 0919 hours. Lung volumes and mediastinal contours are within normal limits. Visualized tracheal air column is within normal limits. Allowing for portable technique the lungs are clear. No pneumothorax or pleural effusion. No acute osseous abnormality identified. Negative visible bowel gas. IMPRESSION: No acute cardiopulmonary abnormality or acute traumatic injury identified. Electronically Signed   By: Odessa Fleming M.D.   On: 11/15/2023 09:46   DG Knee 2 Views Left Result Date: 11/15/2023 CLINICAL DATA:  83 year old female status post unwitnessed fall in  bathroom. Found down. Pain. EXAM: LEFT KNEE - 1-2 VIEW COMPARISON:  None Available. FINDINGS: Two views at 0906 hours. Bone mineralization is within normal limits. Normal joint spaces and alignment for age. Patella appears intact. No evidence of joint effusion. No acute osseous abnormality identified. IMPRESSION: No acute fracture or dislocation identified about the left knee. Electronically Signed   By: Odessa Fleming M.D.   On: 11/15/2023 09:44   DG Wrist Complete Left Result Date: 11/15/2023 CLINICAL DATA:  83 year old female status post unwitnessed fall in bathroom. Found down. Pain. EXAM: LEFT WRIST - COMPLETE 3+ VIEW COMPARISON:  None Available. FINDINGS: Four views at 0856 hours. Bone mineralization is within normal limits for age. Comminuted and dorsally impacted distal left radius fracture. DRU and radiocarpal joint involvement. Unclear whether the ulnar styloid is intact but there is a small 6 mm fracture fragment at the distal ulna metadiaphysis. Generalized soft tissue swelling. Carpal bones appear to remain intact and aligned. No metacarpal fracture identified. IMPRESSION: 1. Comminuted and dorsally impacted distal left radius fracture. 2. Small fracture fragment at the distal ulna metadiaphysis. Electronically Signed   By: Odessa Fleming M.D.   On: 11/15/2023 09:44   DG Hip Unilat W or Wo Pelvis 2-3 Views Left Result Date: 11/15/2023 CLINICAL DATA:  83 year old female status post unwitnessed fall in bathroom. Found down. Pain. EXAM: DG HIP (WITH OR WITHOUT PELVIS) 2-3V LEFT COMPARISON:  None Available. FINDINGS: Three views at 0914 hours. Left femoral neck fracture with varus impaction. The intertrochanteric segment appears to remain intact. The left femoral head appears to remain normally located. No superimposed pelvis fracture identified. Grossly intact proximal right femur. Bone mineralization is within normal limits for age. Symmetric SI joints. Nonobstructed bowel-gas pattern. IMPRESSION: Acute Left  femoral neck fracture with varus impaction. Electronically Signed   By: Odessa Fleming M.D.   On: 11/15/2023 09:42   CT HEAD WO CONTRAST (  ) Result Date: 11/15/2023 CLINICAL DATA:  83 year old female status post unwitnessed fall in bathroom. Found down. Pain. EXAM: CT HEAD WITHOUT CONTRAST TECHNIQUE: Contiguous axial images were obtained from the base of the skull through the vertex without intravenous contrast. RADIATION DOSE REDUCTION: This exam was performed according to the departmental dose-optimization program which includes automated exposure control, adjustment of the mA and/or kV according to patient size and/or use of iterative reconstruction technique. COMPARISON:  Brain MRI 05/04/2015.  Head CT 08/13/2021. FINDINGS: Brain: Motion artifact throughout the posterior fossa. No evidence of intracranial mass effect or ventriculomegaly. No acute intracranial hemorrhage identified. No cortically based acute infarct identified. Vascular: Motion artifact at the skull base. Skull: Motion artifact throughout the skull base. No acute osseous abnormality identified. Sinuses/Orbits: Motion artifact. Other: Visualized scalp soft tissues are within normal limits. IMPRESSION: Motion artifact. No acute intracranial abnormality or acute traumatic injury identified. Electronically Signed   By: Odessa Fleming M.D.   On: 11/15/2023 09:41    Assessment/Plan  Assessment and Plan Periprosthetic fracture She has a periprosthetic fracture causing significant pain and impacting mobility. Surgery is considered but may not improve her progress and could result in a poor outcome. The decision on surgery will depend on the orthopedic surgeon's opinion. Pain control is crucial, and a palliative approach may be necessary if surgery is not pursued. The potential for weight-bearing and physical therapy will also influence the decision-making process. - Consult with Dr. Hyacinth Meeker to determine if surgery is recommended - Administer oxycodone  scheduled ever 6 hours for pain for 7 days, then change to PRN.  - Administer morphine 5 mg q2 hours PRN, if additional pain control is needed - Evaluate weight-bearing status and potential for physical therapy - Consider hospice care if surgery is not an option and weight-bearing is not possible  Goals of Care The discussion included the potential transition to hospice care if surgery is not an option and if she is unable to bear weight. The focus would be on comfort measures and managing pain effectively. The family expressed a desire to give her every chance to recover while also considering her quality of life. The decision to transition to hospice will depend on the orthopedic surgeon's opinion and her ability to bear weight. - Consider hospice care if surgery is not an option and weight-bearing is not possible - Focus on comfort measures and effective pain management  Follow-up Plans to follow up on the orthopedic surgeon's opinion regarding surgery and her weight-bearing status. The decision on whether to proceed with hospice care will depend on these factors. - Follow up with Dr. Hyacinth Meeker regarding surgery recommendation - Determine weight-bearing status and potential for physical therapy - Make a decision on hospice care based on surgery and weight-bearing status    Family/ staff Communication: Nursing, Daughter, Fax results to orthopedics  Labs/tests ordered:  nursing   I spent greater than 30 minutes for the care of this patient in face to face time, chart review, clinical documentation, patient education. I spent an additional 16 minutes discussing goals of care and advanced care planning.

## 2023-12-15 ENCOUNTER — Other Ambulatory Visit: Payer: Self-pay | Admitting: Student

## 2023-12-15 DIAGNOSIS — F03918 Unspecified dementia, unspecified severity, with other behavioral disturbance: Secondary | ICD-10-CM

## 2023-12-15 MED ORDER — LORAZEPAM 0.5 MG PO TABS: 0.5000 mg | ORAL_TABLET | Freq: Every day | ORAL | 0 refills | Status: DC

## 2023-12-15 NOTE — Progress Notes (Signed)
 Refill of chronic anxiety medications.

## 2023-12-23 ENCOUNTER — Non-Acute Institutional Stay (SKILLED_NURSING_FACILITY): Payer: Self-pay | Admitting: Nurse Practitioner

## 2023-12-23 ENCOUNTER — Encounter: Payer: Self-pay | Admitting: Nurse Practitioner

## 2023-12-23 DIAGNOSIS — S62102S Fracture of unspecified carpal bone, left wrist, sequela: Secondary | ICD-10-CM | POA: Diagnosis not present

## 2023-12-23 DIAGNOSIS — N390 Urinary tract infection, site not specified: Secondary | ICD-10-CM

## 2023-12-23 DIAGNOSIS — S72002S Fracture of unspecified part of neck of left femur, sequela: Secondary | ICD-10-CM | POA: Diagnosis not present

## 2023-12-23 DIAGNOSIS — E034 Atrophy of thyroid (acquired): Secondary | ICD-10-CM

## 2023-12-23 DIAGNOSIS — F01C4 Vascular dementia, severe, with anxiety: Secondary | ICD-10-CM | POA: Diagnosis not present

## 2023-12-23 DIAGNOSIS — E440 Moderate protein-calorie malnutrition: Secondary | ICD-10-CM

## 2023-12-23 NOTE — Progress Notes (Signed)
 Location:  Other [place].  Nursing Home Room Number: [facility] 101A Place of Service:  SNF (31) [facility], NP  PCP: Thersia Flax, MD  Patient Care Team: Thersia Flax, MD as PCP - General (Internal Medicine)  Extended Emergency Contact Information Primary Emergency Contact: [name],[name] Mobile Phone: [phone] Relation: Daughter  Goals of care: Advanced Directive information    12/05/2023    9:20 AM  Advanced Directives  Does Patient Have a Medical Advance Directive? Yes  Type of Advance Directive Out of facility DNR (pink MOST or yellow form)  Does patient want to make changes to medical advance directive? No - Patient declined     Chief Complaint  Patient presents with   Medical Management of Chronic Issues    Medical Management of Chronic Issues.     HPI:  Pt is a 83 y.o. female seen today for medical management of chronic disease.  Pt with hx of dementia, anxiety, high fall risk with hip and wrist fracture.  Pain is well controlled.  She continues to lose weight due to minimal intake. Continues on supplements.  Overall pain is well controlled.  No constipated noted.  Goals of care meeting done by Dr Jann Melody earlier in the month and focus on comfort.    Past Medical History:  Diagnosis Date   Arthritis    Cancer (HCC)    SKIN CANCER-BASAL CELL   GERD (gastroesophageal reflux disease)    Hyperlipidemia    Hypothyroidism    Thyroid  disease    Past Surgical History:  Procedure Laterality Date   APPENDECTOMY     CARPOMETACARPAL (CMC) FUSION OF THUMB Left 09/10/2017   Procedure: CARPOMETACARPAL (CMC) FUSION OF THUMB;  Surgeon: Marlynn Singer, MD;  Location: [facility] ORS;  Service: Orthopedics;  Laterality: Left;   CLOSED REDUCTION WRIST FRACTURE Left 11/15/2023   Procedure: CLOSED REDUCTION, WRIST;  Surgeon: Marlynn Singer, MD;  Location: [facility] ORS;  Service: Orthopedics;  Laterality: Left;   HIP ARTHROPLASTY Left 11/15/2023   Procedure:  HEMIARTHROPLASTY (BIPOLAR) HIP, POSTERIOR APPROACH FOR FRACTURE;  Surgeon: Marlynn Singer, MD;  Location: [facility] ORS;  Service: Orthopedics;  Laterality: Left;   MELANOMA EXCISION     x 2, Dr. Adan Holms    Allergies  Allergen Reactions   Hydrocodone      Hallucination   Shellfish Allergy Itching   Statins Other (See Comments)    Unknown    Penicillins Rash    Has patient had a PCN reaction causing immediate rash, facial/tongue/throat swelling, SOB or lightheadedness with hypotension: No Has patient had a PCN reaction causing severe rash involving mucus membranes or skin necrosis: No Has patient had a PCN reaction that required hospitalization: No Has patient had a PCN reaction occurring within the last 10 years: No If all of the above answers are "NO", then may proceed with Cephalosporin use.     Outpatient Encounter Medications as of 12/23/2023  Medication Sig   acetaminophen  (TYLENOL ) 325 MG tablet Take 2 tablets (650 mg total) by mouth every 6 (six) hours as needed for mild pain (pain score 1-3) or moderate pain (pain score 4-6) (or temp > 100.5).   aspirin  EC 81 MG tablet Take 1 tablet (81 mg total) by mouth 2 (two) times daily. Swallow whole.   cyanocobalamin  1000 MCG tablet Take 1,000 mcg by mouth daily.   docusate sodium  (COLACE) 100 MG capsule Take 1 capsule (100 mg total) by mouth 2 (two) times daily.   ferrous sulfate  325 (65 FE) MG tablet  Take 1 tablet (325 mg total) by mouth daily with breakfast.   LORazepam  (ATIVAN ) 0.5 MG tablet Take 1 tablet (0.5 mg total) by mouth daily. (Patient taking differently: Give 0.5 tablet by mouth at bedtime.)   memantine  (NAMENDA ) 5 MG tablet Take 5 mg by mouth 2 (two) times daily.   mirtazapine  (REMERON ) 7.5 MG tablet Take 15 mg by mouth at bedtime.   morphine  (ROXANOL) 20 MG/ML concentrated solution Take 0.25 mLs (5 mg total) by mouth every 2 (two) hours as needed for severe pain (pain score 7-10) or breakthrough pain. (Patient taking  differently: Take 5 mg by mouth every 4 (four) hours as needed for severe pain (pain score 7-10) or breakthrough pain.)   nitrofurantoin , macrocrystal-monohydrate, (MACROBID ) 100 MG capsule Take 100 mg by mouth daily.   Nutritional Supplements (ENSURE CLEAR PO) Take 1 Can by mouth daily.   Omega-3 Fatty Acids (OMEGA 3 PO) Take 30 mLs by mouth daily. 800 mg of Fish Oil  per serving   oxyCODONE  (OXY IR/ROXICODONE ) 5 MG immediate release tablet Take 5 mg by mouth every 6 (six) hours as needed for severe pain (pain score 7-10).   polyethylene glycol (MIRALAX  / GLYCOLAX ) 17 g packet Take 17 g by mouth daily.   senna (SENOKOT) 8.6 MG TABS tablet Take 2 tablets by mouth at bedtime.   sertraline  (ZOLOFT ) 100 MG tablet Take 100 mg by mouth daily.   SYNTHROID  75 MCG tablet TAKE 1 TABLET BY MOUTH DAILY BEFORE BREAKFAST.   [DISCONTINUED] traMADol  (ULTRAM ) 50 MG tablet Take 1 tablet (50 mg total) by mouth every 8 (eight) hours as needed for severe pain (pain score 7-10).   No facility-administered encounter medications on file as of 12/23/2023.    Review of Systems  Unable to perform ROS: Dementia     Immunization History  Administered Date(s) Administered   Influenza Split 06/05/2011, 05/26/2012, 06/24/2013, 06/09/2014   Influenza, High Dose Seasonal PF 06/05/2019, 05/19/2020   Influenza-Unspecified 05/17/2015, 05/16/2016, 05/13/2018, 06/08/2018   Moderna Covid-19 Fall Seasonal Vaccine 77yrs & older 08/16/2022   PFIZER(Purple Top)SARS-COV-2 Vaccination 09/16/2019, 10/07/2019, 06/23/2020   PPD Test 04/17/2015, 02/01/2023   Pneumococcal Conjugate-13 06/16/2014   Pneumococcal Polysaccharide-23 07/11/2011, 04/14/2017, 11/22/2023   RSV,unspecified 11/24/2023   Tdap 07/14/2012, 08/13/2021   Zoster Recombinant(Shingrix) 11/19/2017, 02/23/2018   Zoster, Live 07/16/2010   Pertinent  Health Maintenance Due  Topic Date Due   MAMMOGRAM  04/06/2023   INFLUENZA VACCINE  03/26/2024   DEXA SCAN  Completed       01/01/2021   11:23 AM 08/13/2021    3:29 PM 02/05/2022    9:28 AM 03/29/2022    1:09 PM 01/16/2023   11:59 AM  Fall Risk  Falls in the past year? 0  0 1 0  Was there an injury with Fall? 0   0 0  Fall Risk Category Calculator 0   2 0  Fall Risk Category (Retired) Low   Moderate   (RETIRED) Patient Fall Risk Level Low fall risk High fall risk Low fall risk Moderate fall risk   Patient at Risk for Falls Due to    No Fall Risks;History of fall(s) No Fall Risks  Fall risk Follow up Falls evaluation completed  Falls evaluation completed Falls evaluation completed Falls evaluation completed   Functional Status Survey:    Vitals:   12/23/23 0847  BP: 113/67  Pulse: 68  Resp: 18  Temp: (!) 97.5 F (36.4 C)  SpO2: 99%  Weight: 111 lb 9.6 oz (  50.6 kg)  Height: 5\' 7"  (1.702 m)   Body mass index is 17.48 kg/m. Physical Exam Constitutional:      General: She is not in acute distress.    Appearance: She is well-developed. She is not diaphoretic.  HENT:     Head: Normocephalic and atraumatic.     Mouth/Throat:     Pharynx: No oropharyngeal exudate.  Eyes:     Conjunctiva/sclera: Conjunctivae normal.     Pupils: Pupils are equal, round, and reactive to light.  Cardiovascular:     Rate and Rhythm: Normal rate and regular rhythm.     Heart sounds: Normal heart sounds.  Pulmonary:     Effort: Pulmonary effort is normal.     Breath sounds: Normal breath sounds.  Abdominal:     General: Bowel sounds are normal.     Palpations: Abdomen is soft.  Musculoskeletal:        General: No tenderness.     Cervical back: Normal range of motion and neck supple.  Skin:    General: Skin is warm and dry.  Neurological:     Mental Status: She is alert. She is disoriented.     Motor: Weakness present.     Gait: Gait abnormal.     Labs reviewed: Recent Labs    11/17/23 0523 11/18/23 0445 11/19/23 0459 11/24/23 0000  NA 134* 133* 137 139  K 2.9* 4.4 3.8 3.9  CL 99 102 107 101  CO2  26 25 27  28*  GLUCOSE 102* 133* 109*  --   BUN 17 21 16 14   CREATININE 0.65 0.56 0.55 0.4*  CALCIUM 8.4* 8.3* 8.1* 8.0*  MG 2.2  --   --   --    Recent Labs    11/15/23 0827  AST 20  ALT 16  ALKPHOS 60  BILITOT 0.8  PROT 6.8  ALBUMIN 4.0   Recent Labs    11/15/23 0827 11/16/23 0221 11/17/23 0523 11/18/23 0445 11/19/23 0459 11/20/23 0406  WBC 6.6   < > 11.7* 10.1 9.6  --   NEUTROABS 4.9  --   --   --   --   --   HGB 12.7   < > 10.0* 9.4* 8.7* 9.1*  HCT 38.1   < > 29.8* 27.3* 26.4*  --   MCV 94.1   < > 95.5 94.1 97.1  --   PLT 177   < > 122* 128* 150  --    < > = values in this interval not displayed.   Lab Results  Component Value Date   TSH 1.898 11/15/2023   No results found for: "HGBA1C" Lab Results  Component Value Date   CHOL 254 (H) 01/02/2021   HDL 68.10 01/02/2021   LDLCALC 171 (H) 01/02/2021   LDLDIRECT 172.6 09/08/2013   TRIG 76.0 01/02/2021   CHOLHDL 4 01/02/2021    Significant Diagnostic Results in last 30 days:  No results found.  Assessment/Plan Closed hip fracture requiring operative repair, left, sequela Left hip hemiarthroplasty done by Dr. Annabell Key on 3/22. Continues to have a lot of pain managed by roxanol as needed. High fall risk. Continues on precautions and wheelchair bound.   Hypothyroidism TSH at goal in March   Left wrist fracture, sequela Continues with brace.   Malnutrition of moderate degree Ongoing weight loss, expect to worsen with degree of dementia. Continues on supplement and support with meal times.   Moderate vascular dementia with anxiety (HCC) Ongoing and progressive decline of dementia,  discuss goals of care with daughter and wants focus on comfort only. Discussed hospice consult and agreeable to this once Med A is complete.  She is a DNR and DNH.   Recurrent UTI No current symptoms but daughter reports they are very debilitating when she gets one. Would like to continue to treat if she gets acute infection at  this time. Contiues on nitrofurantion daily       Nihira Puello K. Denney Fisherman [facility] [phone]

## 2023-12-25 DIAGNOSIS — N390 Urinary tract infection, site not specified: Secondary | ICD-10-CM | POA: Insufficient documentation

## 2023-12-25 HISTORY — DX: Urinary tract infection, site not specified: N39.0

## 2023-12-25 LAB — VITAMIN D 25 HYDROXY (VIT D DEFICIENCY, FRACTURES): Vit D, 25-Hydroxy: 52

## 2023-12-25 LAB — COMPREHENSIVE METABOLIC PANEL WITH GFR: Calcium: 9.7 (ref 8.7–10.7)

## 2023-12-25 NOTE — Assessment & Plan Note (Signed)
 Left hip hemiarthroplasty done by Dr. Annabell Key on 3/22. Continues to have a lot of pain managed by roxanol as needed. High fall risk. Continues on precautions and wheelchair bound.

## 2023-12-25 NOTE — Assessment & Plan Note (Signed)
 Ongoing and progressive decline of dementia, discuss goals of care with daughter and wants focus on comfort only. Discussed hospice consult and agreeable to this once Med A is complete.  She is a DNR and DNH.

## 2023-12-25 NOTE — Assessment & Plan Note (Signed)
 TSH at goal in March

## 2023-12-25 NOTE — Assessment & Plan Note (Signed)
 No current symptoms but daughter reports they are very debilitating when she gets one. Would like to continue to treat if she gets acute infection at this time. Contiues on nitrofurantion daily

## 2023-12-25 NOTE — Assessment & Plan Note (Signed)
 Continues with brace.

## 2023-12-25 NOTE — Assessment & Plan Note (Signed)
 Ongoing weight loss, expect to worsen with degree of dementia. Continues on supplement and support with meal times.

## 2023-12-29 ENCOUNTER — Encounter: Payer: Self-pay | Admitting: Student

## 2023-12-29 ENCOUNTER — Non-Acute Institutional Stay (SKILLED_NURSING_FACILITY): Payer: Self-pay | Admitting: Student

## 2023-12-29 DIAGNOSIS — E43 Unspecified severe protein-calorie malnutrition: Secondary | ICD-10-CM

## 2023-12-29 DIAGNOSIS — F01C4 Vascular dementia, severe, with anxiety: Secondary | ICD-10-CM

## 2023-12-29 NOTE — Progress Notes (Signed)
 Location:   [facility] Room Number: 101-A Place of Service:  SNF [phone]) Provider:  Dr.Simranjit Thayer  PCP: Valrie Gehrig, MD  Patient Care Team: Valrie Gehrig, MD as PCP - General (Family Medicine)  Extended Emergency Contact Information Primary Emergency Contact: [name],[name] Mobile Phone: [phone] Relation: Daughter  Code Status:  DNR Goals of care: Advanced Directive information    12/29/2023   11:59 AM  Advanced Directives  Does Patient Have a Medical Advance Directive? Yes  Type of Advance Directive Out of facility DNR (pink MOST or yellow form)  Does patient want to make changes to medical advance directive? No - Patient declined     Chief Complaint  Patient presents with   Weight Loss    HPI:  Pt is a 83 y.o. female seen today for an acute visit for continued weight loss.   Patient is smiling and denies concerns at this time.   Nursing with concern patient continues to have extremely poor PO intake despite encouraging and coaching.    Past Medical History:  Diagnosis Date   Arthritis    Cancer (HCC)    SKIN CANCER-BASAL CELL   GERD (gastroesophageal reflux disease)    Hyperlipidemia    Hypothyroidism    Thyroid  disease    Past Surgical History:  Procedure Laterality Date   APPENDECTOMY     CARPOMETACARPAL (CMC) FUSION OF THUMB Left 09/10/2017   Procedure: CARPOMETACARPAL (CMC) FUSION OF THUMB;  Surgeon: Marlynn Singer, MD;  Location: [facility] ORS;  Service: Orthopedics;  Laterality: Left;   CLOSED REDUCTION WRIST FRACTURE Left 11/15/2023   Procedure: CLOSED REDUCTION, WRIST;  Surgeon: Marlynn Singer, MD;  Location: [facility] ORS;  Service: Orthopedics;  Laterality: Left;   HIP ARTHROPLASTY Left 11/15/2023   Procedure: HEMIARTHROPLASTY (BIPOLAR) HIP, POSTERIOR APPROACH FOR FRACTURE;  Surgeon: Marlynn Singer, MD;  Location: [facility] ORS;  Service: Orthopedics;  Laterality: Left;   MELANOMA EXCISION     x 2, Dr. Adan Holms    Allergies   Allergen Reactions   Hydrocodone      Hallucination   Shellfish Allergy Itching   Statins Other (See Comments)    Unknown    Penicillins Rash    Has patient had a PCN reaction causing immediate rash, facial/tongue/throat swelling, SOB or lightheadedness with hypotension: No Has patient had a PCN reaction causing severe rash involving mucus membranes or skin necrosis: No Has patient had a PCN reaction that required hospitalization: No Has patient had a PCN reaction occurring within the last 10 years: No If all of the above answers are "NO", then may proceed with Cephalosporin use.     Allergies as of 12/29/2023       Reactions   Hydrocodone     Hallucination   Shellfish Allergy Itching   Statins Other (See Comments)   Unknown    Penicillins Rash   Has patient had a PCN reaction causing immediate rash, facial/tongue/throat swelling, SOB or lightheadedness with hypotension: No Has patient had a PCN reaction causing severe rash involving mucus membranes or skin necrosis: No Has patient had a PCN reaction that required hospitalization: No Has patient had a PCN reaction occurring within the last 10 years: No If all of the above answers are "NO", then may proceed with Cephalosporin use.        Medication List        Accurate as of Dec 29, 2023 11:59 PM. If you have any questions, ask your nurse or doctor.  acetaminophen  325 MG tablet Commonly known as: TYLENOL  Take 2 tablets (650 mg total) by mouth every 6 (six) hours as needed for mild pain (pain score 1-3) or moderate pain (pain score 4-6) (or temp > 100.5).   aspirin  EC 81 MG tablet Take 1 tablet (81 mg total) by mouth 2 (two) times daily. Swallow whole.   cyanocobalamin  1000 MCG tablet Take 1,000 mcg by mouth daily.   docusate sodium  100 MG capsule Commonly known as: COLACE Take 1 capsule (100 mg total) by mouth 2 (two) times daily.   ENSURE CLEAR PO Take 1 Can by mouth daily.   ferrous sulfate  325 (65  FE) MG tablet Take 1 tablet (325 mg total) by mouth daily with breakfast.   LORazepam  0.5 MG tablet Commonly known as: ATIVAN  Take 1 tablet (0.5 mg total) by mouth daily.   memantine  5 MG tablet Commonly known as: NAMENDA  Take 5 mg by mouth 2 (two) times daily.   mirtazapine  7.5 MG tablet Commonly known as: REMERON  Take 15 mg by mouth at bedtime.   mirtazapine  15 MG tablet Commonly known as: REMERON  Take 15 mg by mouth at bedtime.   morphine  20 MG/ML concentrated solution Commonly known as: ROXANOL Take 20 mg by mouth every 4 (four) hours as needed for severe pain (pain score 7-10).   morphine  20 MG/ML concentrated solution Commonly known as: ROXANOL Take 0.25 mLs (5 mg total) by mouth every 2 (two) hours as needed for severe pain (pain score 7-10) or breakthrough pain.   nitrofurantoin  (macrocrystal-monohydrate) 100 MG capsule Commonly known as: MACROBID  Take 100 mg by mouth daily.   OMEGA 3 PO Take 30 mLs by mouth at bedtime. 800 mg of Fish Oil  per serving   oxyCODONE  5 MG immediate release tablet Commonly known as: Oxy IR/ROXICODONE  Take 5 mg by mouth every 6 (six) hours as needed for severe pain (pain score 7-10).   polyethylene glycol 17 g packet Commonly known as: MIRALAX  / GLYCOLAX  Take 17 g by mouth daily.   senna 8.6 MG Tabs tablet Commonly known as: SENOKOT Take 2 tablets by mouth at bedtime.   sertraline  100 MG tablet Commonly known as: ZOLOFT  Take 100 mg by mouth daily.   Synthroid  75 MCG tablet Generic drug: levothyroxine  TAKE 1 TABLET BY MOUTH DAILY BEFORE BREAKFAST.   traMADol  50 MG tablet Commonly known as: ULTRAM  Take 50 mg by mouth every 8 (eight) hours as needed.        Review of Systems  Immunization History  Administered Date(s) Administered   Influenza Split 06/05/2011, 05/26/2012, 06/24/2013, 06/09/2014   Influenza, High Dose Seasonal PF 06/05/2019, 05/19/2020   Influenza-Unspecified 05/17/2015, 05/16/2016, 05/13/2018,  06/08/2018   Moderna Covid-19 Fall Seasonal Vaccine 71yrs & older 08/16/2022   PFIZER(Purple Top)SARS-COV-2 Vaccination 09/16/2019, 10/07/2019, 06/23/2020   PPD Test 04/17/2015, 02/01/2023   Pneumococcal Conjugate-13 06/16/2014   Pneumococcal Polysaccharide-23 07/11/2011, 04/14/2017, 11/22/2023   RSV,unspecified 11/24/2023   Tdap 07/14/2012, 08/13/2021   Zoster Recombinant(Shingrix) 11/19/2017, 02/23/2018   Zoster, Live 07/16/2010   Pertinent  Health Maintenance Due  Topic Date Due   MAMMOGRAM  04/06/2023   INFLUENZA VACCINE  03/26/2024   DEXA SCAN  Completed      01/01/2021   11:23 AM 08/13/2021    3:29 PM 02/05/2022    9:28 AM 03/29/2022    1:09 PM 01/16/2023   11:59 AM  Fall Risk  Falls in the past year? 0  0 1 0  Was there an injury with Fall? 0  0 0  Fall Risk Category Calculator 0   2 0  Fall Risk Category (Retired) Low   Moderate   (RETIRED) Patient Fall Risk Level Low fall risk High fall risk Low fall risk Moderate fall risk   Patient at Risk for Falls Due to    No Fall Risks;History of fall(s) No Fall Risks  Fall risk Follow up Falls evaluation completed  Falls evaluation completed Falls evaluation completed Falls evaluation completed   Functional Status Survey:    Vitals:   12/29/23 1139  BP: 128/68  Pulse: 65  Resp: 16  Temp: 97.6 F (36.4 C)  SpO2: 97%  Weight: 110 lb (49.9 kg)  Height: 5\' 7"  (1.702 m)   Body mass index is 17.23 kg/m. Physical Exam Constitutional:      Comments: Thin, frail  Neurological:     Mental Status: She is disoriented.     Labs reviewed: Recent Labs    11/17/23 0523 11/18/23 0445 11/19/23 0459 11/24/23 0000 12/25/23 0000  NA 134* 133* 137 139  --   K 2.9* 4.4 3.8 3.9  --   CL 99 102 107 101  --   CO2 26 25 27  28*  --   GLUCOSE 102* 133* 109*  --   --   BUN 17 21 16 14   --   CREATININE 0.65 0.56 0.55 0.4*  --   CALCIUM 8.4* 8.3* 8.1* 8.0* 9.7  MG 2.2  --   --   --   --    Recent Labs    11/15/23 0827  AST 20   ALT 16  ALKPHOS 60  BILITOT 0.8  PROT 6.8  ALBUMIN 4.0   Recent Labs    11/15/23 0827 11/16/23 0221 11/17/23 0523 11/18/23 0445 11/19/23 0459 11/20/23 0406  WBC 6.6   < > 11.7* 10.1 9.6  --   NEUTROABS 4.9  --   --   --   --   --   HGB 12.7   < > 10.0* 9.4* 8.7* 9.1*  HCT 38.1   < > 29.8* 27.3* 26.4*  --   MCV 94.1   < > 95.5 94.1 97.1  --   PLT 177   < > 122* 128* 150  --    < > = values in this interval not displayed.   Lab Results  Component Value Date   TSH 1.898 11/15/2023   No results found for: "HGBA1C" Lab Results  Component Value Date   CHOL 254 (H) 01/02/2021   HDL 68.10 01/02/2021   LDLCALC 171 (H) 01/02/2021   LDLDIRECT 172.6 09/08/2013   TRIG 76.0 01/02/2021   CHOLHDL 4 01/02/2021    Significant Diagnostic Results in last 30 days:  No results found.  Assessment/Plan Severe protein-calorie malnutrition (HCC)  Severe vascular dementia with anxiety [facility]) Patient continues to have weight loss. Concern weight loss will contribute to infections and continued decline. Will contact family to continue goals of care conversations.   Family/ staff Communication: nursing  Labs/tests ordered:  none

## 2024-01-04 ENCOUNTER — Encounter: Payer: Self-pay | Admitting: Student

## 2024-01-12 ENCOUNTER — Other Ambulatory Visit: Payer: Self-pay | Admitting: Student

## 2024-01-12 DIAGNOSIS — E43 Unspecified severe protein-calorie malnutrition: Secondary | ICD-10-CM

## 2024-01-12 DIAGNOSIS — F01C4 Vascular dementia, severe, with anxiety: Secondary | ICD-10-CM

## 2024-01-12 NOTE — Progress Notes (Signed)
 Patient with continued progression and decline in dementia. Not progressing with therapy. Continued weight loss. Poor PO intake.

## 2024-01-16 ENCOUNTER — Encounter: Payer: Self-pay | Admitting: Student

## 2024-01-16 ENCOUNTER — Non-Acute Institutional Stay: Payer: Self-pay | Admitting: Student

## 2024-01-16 NOTE — Progress Notes (Unsigned)
 This encounter was created in error - please disregard.

## 2024-01-20 ENCOUNTER — Other Ambulatory Visit: Payer: Self-pay | Admitting: Nurse Practitioner

## 2024-01-20 DIAGNOSIS — F03918 Unspecified dementia, unspecified severity, with other behavioral disturbance: Secondary | ICD-10-CM

## 2024-01-20 MED ORDER — LORAZEPAM 0.5 MG PO TABS: 0.5000 mg | ORAL_TABLET | Freq: Every day | ORAL | 5 refills | Status: DC

## 2024-01-21 ENCOUNTER — Encounter: Payer: Self-pay | Admitting: Student

## 2024-01-21 ENCOUNTER — Non-Acute Institutional Stay (SKILLED_NURSING_FACILITY): Payer: Self-pay | Admitting: Student

## 2024-01-21 DIAGNOSIS — E034 Atrophy of thyroid (acquired): Secondary | ICD-10-CM

## 2024-01-21 DIAGNOSIS — S72002S Fracture of unspecified part of neck of left femur, sequela: Secondary | ICD-10-CM

## 2024-01-21 DIAGNOSIS — F03918 Unspecified dementia, unspecified severity, with other behavioral disturbance: Secondary | ICD-10-CM | POA: Diagnosis not present

## 2024-01-21 DIAGNOSIS — E43 Unspecified severe protein-calorie malnutrition: Secondary | ICD-10-CM | POA: Diagnosis not present

## 2024-01-21 DIAGNOSIS — S62102S Fracture of unspecified carpal bone, left wrist, sequela: Secondary | ICD-10-CM

## 2024-01-21 DIAGNOSIS — D62 Acute posthemorrhagic anemia: Secondary | ICD-10-CM

## 2024-01-21 NOTE — Progress Notes (Unsigned)
 Location:  Other [place].  Nursing Home Room Number: [facility] Place of Service:  SNF [phone]) Provider:  Valrie Gehrig, MD  Patient Care Team: Valrie Gehrig, MD as PCP - General (Family Medicine)  Extended Emergency Contact Information Primary Emergency Contact: [name],[name] Mobile Phone: [phone] Relation: Daughter  Code Status:  DNR Goals of care: Advanced Directive information    12/29/2023   11:59 AM  Advanced Directives  Does Patient Have a Medical Advance Directive? Yes  Type of Advance Directive Out of facility DNR (pink MOST or yellow form)  Does patient want to make changes to medical advance directive? No - Patient declined     Chief Complaint  Patient presents with   Medical Management of Chronic Issues    Medical Management of Chronic Issues.     HPI:  Pt is a 83 y.o. female seen today for medical management of chronic diseases.   Patient has had continued decline over the last month. Poor food intake despite efforts. She has a fracture of LUE, and family would like removed -- appt with specialists for removal.   Minimally conversant at this time.   Past Medical History:  Diagnosis Date   Arthritis    Cancer (HCC)    SKIN CANCER-BASAL CELL   GERD (gastroesophageal reflux disease)    Hyperlipidemia    Hypothyroidism    Thyroid  disease    Past Surgical History:  Procedure Laterality Date   APPENDECTOMY     CARPOMETACARPAL (CMC) FUSION OF THUMB Left 09/10/2017   Procedure: CARPOMETACARPAL (CMC) FUSION OF THUMB;  Surgeon: Marlynn Singer, MD;  Location: [facility] ORS;  Service: Orthopedics;  Laterality: Left;   CLOSED REDUCTION WRIST FRACTURE Left 11/15/2023   Procedure: CLOSED REDUCTION, WRIST;  Surgeon: Marlynn Singer, MD;  Location: [facility] ORS;  Service: Orthopedics;  Laterality: Left;   HIP ARTHROPLASTY Left 11/15/2023   Procedure: HEMIARTHROPLASTY (BIPOLAR) HIP, POSTERIOR APPROACH FOR FRACTURE;  Surgeon: Marlynn Singer, MD;  Location:  [facility] ORS;  Service: Orthopedics;  Laterality: Left;   MELANOMA EXCISION     x 2, Dr. Adan Holms    Allergies  Allergen Reactions   Hydrocodone      Hallucination   Shellfish Allergy Itching   Statins Other (See Comments)    Unknown    Penicillins Rash    Has patient had a PCN reaction causing immediate rash, facial/tongue/throat swelling, SOB or lightheadedness with hypotension: No Has patient had a PCN reaction causing severe rash involving mucus membranes or skin necrosis: No Has patient had a PCN reaction that required hospitalization: No Has patient had a PCN reaction occurring within the last 10 years: No If all of the above answers are "NO", then may proceed with Cephalosporin use.     Outpatient Encounter Medications as of 01/21/2024  Medication Sig   acetaminophen  (TYLENOL ) 325 MG tablet Take 2 tablets (650 mg total) by mouth every 6 (six) hours as needed for mild pain (pain score 1-3) or moderate pain (pain score 4-6) (or temp > 100.5).   LORazepam  (ATIVAN ) 0.5 MG tablet Take 1 tablet (0.5 mg total) by mouth daily.   morphine  (ROXANOL) 20 MG/ML concentrated solution Take 0.25 mLs (5 mg total) by mouth every 2 (two) hours as needed for severe pain (pain score 7-10) or breakthrough pain. (Patient taking differently: Take 5 mg by mouth every 4 (four) hours as needed for severe pain (pain score 7-10) or breakthrough pain.)   morphine  (ROXANOL) 20 MG/ML concentrated solution Take 20 mg by mouth every 4 (  four) hours as needed for severe pain (pain score 7-10).   oxyCODONE  (OXY IR/ROXICODONE ) 5 MG immediate release tablet Take 5 mg by mouth every 6 (six) hours as needed for severe pain (pain score 7-10).   polyethylene glycol (MIRALAX  / GLYCOLAX ) 17 g packet Take 17 g by mouth daily.   [DISCONTINUED] aspirin  EC 81 MG tablet Take 1 tablet (81 mg total) by mouth 2 (two) times daily. Swallow whole.   [DISCONTINUED] cyanocobalamin  1000 MCG tablet Take 1,000 mcg by mouth daily.    [DISCONTINUED] docusate sodium  (COLACE) 100 MG capsule Take 1 capsule (100 mg total) by mouth 2 (two) times daily.   [DISCONTINUED] ferrous sulfate  325 (65 FE) MG tablet Take 1 tablet (325 mg total) by mouth daily with breakfast.   [DISCONTINUED] mirtazapine  (REMERON ) 15 MG tablet Take 15 mg by mouth at bedtime.   [DISCONTINUED] nitrofurantoin , macrocrystal-monohydrate, (MACROBID ) 100 MG capsule Take 100 mg by mouth daily.   [DISCONTINUED] Nutritional Supplements (ENSURE CLEAR PO) Take 1 Can by mouth daily.   [DISCONTINUED] Omega-3 Fatty Acids (OMEGA 3 PO) Take 30 mLs by mouth at bedtime. 800 mg of Fish Oil  per serving   [DISCONTINUED] senna (SENOKOT) 8.6 MG TABS tablet Take 2 tablets by mouth at bedtime.   [DISCONTINUED] sertraline  (ZOLOFT ) 100 MG tablet Take 100 mg by mouth daily.   [DISCONTINUED] SYNTHROID  75 MCG tablet TAKE 1 TABLET BY MOUTH DAILY BEFORE BREAKFAST.   [DISCONTINUED] traMADol  (ULTRAM ) 50 MG tablet Take 50 mg by mouth every 8 (eight) hours as needed.   No facility-administered encounter medications on file as of 01/21/2024.    Review of Systems  Immunization History  Administered Date(s) Administered   Influenza Split 06/05/2011, 05/26/2012, 06/24/2013, 06/09/2014   Influenza, High Dose Seasonal PF 06/05/2019, 05/19/2020   Influenza-Unspecified 05/17/2015, 05/16/2016, 05/13/2018, 06/08/2018   Moderna Covid-19 Fall Seasonal Vaccine 75yrs & older 08/16/2022   PFIZER(Purple Top)SARS-COV-2 Vaccination 09/16/2019, 10/07/2019, 06/23/2020   PPD Test 04/17/2015, 02/01/2023   Pneumococcal Conjugate-13 06/16/2014   Pneumococcal Polysaccharide-23 07/11/2011, 04/14/2017, 11/22/2023   RSV,unspecified 11/24/2023   Tdap 07/14/2012, 08/13/2021   Zoster Recombinant(Shingrix) 11/19/2017, 02/23/2018   Zoster, Live 07/16/2010   Pertinent  Health Maintenance Due  Topic Date Due   MAMMOGRAM  04/06/2023   INFLUENZA VACCINE  03/26/2024   DEXA SCAN  Completed      01/01/2021   11:23 AM  08/13/2021    3:29 PM 02/05/2022    9:28 AM 03/29/2022    1:09 PM 01/16/2023   11:59 AM  Fall Risk  Falls in the past year? 0  0 1 0  Was there an injury with Fall? 0   0 0  Fall Risk Category Calculator 0   2 0  Fall Risk Category (Retired) Low   Moderate   (RETIRED) Patient Fall Risk Level Low fall risk High fall risk Low fall risk Moderate fall risk   Patient at Risk for Falls Due to    No Fall Risks;History of fall(s) No Fall Risks  Fall risk Follow up Falls evaluation completed  Falls evaluation completed Falls evaluation completed Falls evaluation completed   Functional Status Survey:    Vitals:   01/21/24 1013 01/21/24 1022  BP: (!) 156/67 125/71  Pulse: 75   Resp: 18   Temp: (!) 97.2 F (36.2 C)   SpO2: 96%   Weight: 108 lb 3.2 oz (49.1 kg)   Height: 5\' 7"  (1.702 m)    Body mass index is 16.95 kg/m. Physical Exam Constitutional:  Comments: Thin, frail, lying in bed  Cardiovascular:     Rate and Rhythm: Normal rate.  Pulmonary:     Effort: Pulmonary effort is normal.  Abdominal:     General: Abdomen is flat.     Labs reviewed: Recent Labs    11/17/23 0523 11/18/23 0445 11/19/23 0459 11/24/23 0000 12/25/23 0000  NA 134* 133* 137 139  --   K 2.9* 4.4 3.8 3.9  --   CL 99 102 107 101  --   CO2 26 25 27  28*  --   GLUCOSE 102* 133* 109*  --   --   BUN 17 21 16 14   --   CREATININE 0.65 0.56 0.55 0.4*  --   CALCIUM 8.4* 8.3* 8.1* 8.0* 9.7  MG 2.2  --   --   --   --    Recent Labs    11/15/23 0827  AST 20  ALT 16  ALKPHOS 60  BILITOT 0.8  PROT 6.8  ALBUMIN 4.0   Recent Labs    11/15/23 0827 11/16/23 0221 11/17/23 0523 11/18/23 0445 11/19/23 0459 11/20/23 0406  WBC 6.6   < > 11.7* 10.1 9.6  --   NEUTROABS 4.9  --   --   --   --   --   HGB 12.7   < > 10.0* 9.4* 8.7* 9.1*  HCT 38.1   < > 29.8* 27.3* 26.4*  --   MCV 94.1   < > 95.5 94.1 97.1  --   PLT 177   < > 122* 128* 150  --    < > = values in this interval not displayed.   Lab  Results  Component Value Date   TSH 1.898 11/15/2023   No results found for: "HGBA1C" Lab Results  Component Value Date   CHOL 254 (H) 01/02/2021   HDL 68.10 01/02/2021   LDLCALC 171 (H) 01/02/2021   LDLDIRECT 172.6 09/08/2013   TRIG 76.0 01/02/2021   CHOLHDL 4 01/02/2021    Significant Diagnostic Results in last 30 days:  No results found.  Assessment/Plan Severe protein-calorie malnutrition (HCC)  Dementia with behavioral disturbance (HCC)  Closed hip fracture requiring operative repair, left, sequela  Hypothyroidism due to acquired atrophy of thyroid   Left wrist fracture, sequela  Acute blood loss anemia Patient with significant continued decline since initial fall which resulted in hip and wrist fracture on 11/15/2023. Weight loss of >10 lbs in the last 2 months despite efforts of supplementation, coaching, and feeding support. Patient is now admitted to hospice. Difficulty swallowing pills, will discontinue all medications that are not comfort focused at this time. Continue morphine , oxycodone , ativan , and bowel regimen.   Family/ staff Communication: nursing  Labs/tests ordered:  none

## 2024-01-22 ENCOUNTER — Encounter: Payer: Self-pay | Admitting: Student

## 2024-02-12 ENCOUNTER — Non-Acute Institutional Stay (SKILLED_NURSING_FACILITY): Payer: Self-pay | Admitting: Nurse Practitioner

## 2024-02-12 ENCOUNTER — Encounter: Payer: Self-pay | Admitting: Nurse Practitioner

## 2024-02-12 DIAGNOSIS — E43 Unspecified severe protein-calorie malnutrition: Secondary | ICD-10-CM | POA: Diagnosis not present

## 2024-02-12 DIAGNOSIS — S72002S Fracture of unspecified part of neck of left femur, sequela: Secondary | ICD-10-CM | POA: Diagnosis not present

## 2024-02-12 DIAGNOSIS — F03918 Unspecified dementia, unspecified severity, with other behavioral disturbance: Secondary | ICD-10-CM | POA: Diagnosis not present

## 2024-02-12 DIAGNOSIS — K5903 Drug induced constipation: Secondary | ICD-10-CM

## 2024-02-12 NOTE — Progress Notes (Signed)
 Location:  Other Nursing Home Room Number: [facility] [address] Place of Service:  SNF [phone])  Jann Melody, [place], MD  Patient Care Team: Valrie Gehrig, MD as PCP - General [facility] Medicine)  Extended Emergency Contact Information Primary Emergency Contact: [name],[name] Mobile Phone: [phone] Relation: Daughter  Goals of care: Advanced Directive information    12/29/2023   11:59 AM  Advanced Directives  Does Patient Have a Medical Advance Directive? Yes  Type of Advance Directive Out of facility DNR (pink MOST or yellow form)  Does patient want to make changes to medical advance directive? No - Patient declined     Chief Complaint  Patient presents with   Follow-up    Routine Follow up    HPI:  Pt is a 83 y.o. female seen today for medical management of chronic disease. Pt with hx of GERD, dementia, constipations and falls.  She is under hospice care after fall progressive decline in her dementia. She is very pleasantly confused and stays in bed due to increase in pain with movement Pain has been well controlled with morphine  PRN She is on routine medication for constipation and this is not an issue.  Without concerns from nursing.    Past Medical History:  Diagnosis Date   Arthritis    Cancer (HCC)    SKIN CANCER-BASAL CELL   GERD (gastroesophageal reflux disease)    Hyperlipidemia    Hypothyroidism    Thyroid  disease    Past Surgical History:  Procedure Laterality Date   APPENDECTOMY     CARPOMETACARPAL (CMC) FUSION OF THUMB Left 09/10/2017   Procedure: CARPOMETACARPAL (CMC) FUSION OF THUMB;  Surgeon: Marlynn Singer, MD;  Location: [facility] ORS;  Service: Orthopedics;  Laterality: Left;   CLOSED REDUCTION WRIST FRACTURE Left 11/15/2023   Procedure: CLOSED REDUCTION, WRIST;  Surgeon: Marlynn Singer, MD;  Location: [facility] ORS;  Service: Orthopedics;  Laterality: Left;   HIP ARTHROPLASTY Left 11/15/2023   Procedure: HEMIARTHROPLASTY (BIPOLAR) HIP, POSTERIOR  APPROACH FOR FRACTURE;  Surgeon: Marlynn Singer, MD;  Location: [facility] ORS;  Service: Orthopedics;  Laterality: Left;   MELANOMA EXCISION     x 2, Dr. Adan Holms    Allergies  Allergen Reactions   Hydrocodone      Hallucination   Shellfish Allergy Itching   Statins Other (See Comments)    Unknown    Penicillins Rash    Has patient had a PCN reaction causing immediate rash, facial/tongue/throat swelling, SOB or lightheadedness with hypotension: No Has patient had a PCN reaction causing severe rash involving mucus membranes or skin necrosis: No Has patient had a PCN reaction that required hospitalization: No Has patient had a PCN reaction occurring within the last 10 years: No If all of the above answers are NO, then may proceed with Cephalosporin use.     Outpatient Encounter Medications as of 02/12/2024  Medication Sig   acetaminophen  (TYLENOL ) 325 MG tablet Take 2 tablets (650 mg total) by mouth every 6 (six) hours as needed for mild pain (pain score 1-3) or moderate pain (pain score 4-6) (or temp > 100.5).   LORazepam  (ATIVAN ) 0.5 MG tablet Take 1 tablet (0.5 mg total) by mouth daily.   morphine  (ROXANOL) 20 MG/ML concentrated solution Take 0.25 mLs (5 mg total) by mouth every 2 (two) hours as needed for severe pain (pain score 7-10) or breakthrough pain.   oxyCODONE  (OXY IR/ROXICODONE ) 5 MG immediate release tablet Take 5 mg by mouth every 6 (six) hours as needed for severe pain (pain score 7-10).  polyethylene glycol (MIRALAX  / GLYCOLAX ) 17 g packet Take 17 g by mouth daily.   senna (SENOKOT) 8.6 MG TABS tablet 2 tablets at bedtime.   morphine  (ROXANOL) 20 MG/ML concentrated solution Take 20 mg by mouth every 4 (four) hours as needed for severe pain (pain score 7-10). (Patient not taking: Reported on 02/12/2024)   No facility-administered encounter medications on file as of 02/12/2024.    Review of Systems  Unable to perform ROS: Dementia     Immunization History  Administered  Date(s) Administered   Influenza Split 06/05/2011, 05/26/2012, 06/24/2013, 06/09/2014   Influenza, High Dose Seasonal PF 06/05/2019, 05/19/2020   Influenza-Unspecified 05/17/2015, 05/16/2016, 05/13/2018, 06/08/2018   Moderna Covid-19 Fall Seasonal Vaccine 26yrs & older 08/16/2022   PFIZER(Purple Top)SARS-COV-2 Vaccination 09/16/2019, 10/07/2019, 06/23/2020   PPD Test 04/17/2015, 02/01/2023   Pneumococcal Conjugate-13 06/16/2014   Pneumococcal Polysaccharide-23 07/11/2011, 04/14/2017, 11/22/2023   RSV,unspecified 11/24/2023   Tdap 07/14/2012, 08/13/2021   Zoster Recombinant(Shingrix) 11/19/2017, 02/23/2018   Zoster, Live 07/16/2010   Pertinent  Health Maintenance Due  Topic Date Due   MAMMOGRAM  04/06/2023   INFLUENZA VACCINE  03/26/2024   DEXA SCAN  Completed      01/01/2021   11:23 AM 08/13/2021    3:29 PM 02/05/2022    9:28 AM 03/29/2022    1:09 PM 01/16/2023   11:59 AM  Fall Risk  Falls in the past year? 0  0 1 0  Was there an injury with Fall? 0   0 0  Fall Risk Category Calculator 0   2 0  Fall Risk Category (Retired) Low    Moderate    (RETIRED) Patient Fall Risk Level Low fall risk  High fall risk  Low fall risk  Moderate fall risk    Patient at Risk for Falls Due to    No Fall Risks;History of fall(s) No Fall Risks  Fall risk Follow up Falls evaluation completed   Falls evaluation completed  Falls evaluation completed  Falls evaluation completed     Data saved with a previous flowsheet row definition   Functional Status Survey:    Vitals:   02/12/24 1116  BP: 113/61  Pulse: 65  Resp: 18  Temp: 97.9 F (36.6 C)  SpO2: 97%  Weight: 108 lb 3.2 oz (49.1 kg)  Height: 5' 7 (1.702 m)   Body mass index is 16.95 kg/m. Physical Exam Constitutional:      General: She is not in acute distress.    Appearance: She is well-developed. She is not diaphoretic.  HENT:     Head: Normocephalic and atraumatic.     Mouth/Throat:     Pharynx: No oropharyngeal exudate.    Eyes:     Conjunctiva/sclera: Conjunctivae normal.     Pupils: Pupils are equal, round, and reactive to light.    Cardiovascular:     Rate and Rhythm: Normal rate and regular rhythm.     Heart sounds: Normal heart sounds.  Pulmonary:     Effort: Pulmonary effort is normal.     Breath sounds: Normal breath sounds.  Abdominal:     General: Bowel sounds are normal.     Palpations: Abdomen is soft.   Musculoskeletal:     Cervical back: Normal range of motion and neck supple.     Right lower leg: No edema.     Left lower leg: No edema.   Skin:    General: Skin is warm and dry.   Neurological:     Mental  Status: She is alert. She is disoriented.     Motor: Weakness present.     Gait: Gait abnormal.   Psychiatric:        Cognition and Memory: Cognition is impaired. Memory is impaired. She exhibits impaired recent memory and impaired remote memory.     Labs reviewed: Recent Labs    11/17/23 0523 11/18/23 0445 11/19/23 0459 11/24/23 0000 12/25/23 0000  NA 134* 133* 137 139  --   K 2.9* 4.4 3.8 3.9  --   CL 99 102 107 101  --   CO2 26 25 27  28*  --   GLUCOSE 102* 133* 109*  --   --   BUN 17 21 16 14   --   CREATININE 0.65 0.56 0.55 0.4*  --   CALCIUM 8.4* 8.3* 8.1* 8.0* 9.7  MG 2.2  --   --   --   --    Recent Labs    11/15/23 0827  AST 20  ALT 16  ALKPHOS 60  BILITOT 0.8  PROT 6.8  ALBUMIN 4.0   Recent Labs    11/15/23 0827 11/16/23 0221 11/17/23 0523 11/18/23 0445 11/19/23 0459 11/20/23 0406  WBC 6.6   < > 11.7* 10.1 9.6  --   NEUTROABS 4.9  --   --   --   --   --   HGB 12.7   < > 10.0* 9.4* 8.7* 9.1*  HCT 38.1   < > 29.8* 27.3* 26.4*  --   MCV 94.1   < > 95.5 94.1 97.1  --   PLT 177   < > 122* 128* 150  --    < > = values in this interval not displayed.   Lab Results  Component Value Date   TSH 1.898 11/15/2023   No results found for: HGBA1C Lab Results  Component Value Date   CHOL 254 (H) 01/02/2021   HDL 68.10 01/02/2021   LDLCALC  171 (H) 01/02/2021   LDLDIRECT 172.6 09/08/2013   TRIG 76.0 01/02/2021   CHOLHDL 4 01/02/2021    Significant Diagnostic Results in last 30 days:  No results found.  Assessment/Plan No problem-specific Assessment & Plan notes found for this encounter. 1. Severe protein-calorie malnutrition (HCC) (Primary) Ongoing and except to worsen with progression of dementia  2. Dementia with behavioral disturbance (HCC) Progressive decline, on hospice for comfort measures only Continues supportive  3. Closed hip fracture requiring operative repair, left, sequela 11/15/2023- she has chronic pain but has been well controlled on morphine , will continue current regimen  4. Drug-induced constipation -stable, continues on miralax  and senna     Corean Yoshimura K. Denney Fisherman [facility] [phone]

## 2024-02-16 ENCOUNTER — Other Ambulatory Visit: Payer: Self-pay | Admitting: Student

## 2024-02-16 DIAGNOSIS — F03918 Unspecified dementia, unspecified severity, with other behavioral disturbance: Secondary | ICD-10-CM

## 2024-02-16 MED ORDER — LORAZEPAM 0.5 MG PO TABS: 0.5000 mg | ORAL_TABLET | Freq: Every day | ORAL | 5 refills | Status: DC

## 2024-03-23 ENCOUNTER — Other Ambulatory Visit: Payer: Self-pay | Admitting: Nurse Practitioner

## 2024-03-23 DIAGNOSIS — F03918 Unspecified dementia, unspecified severity, with other behavioral disturbance: Secondary | ICD-10-CM

## 2024-03-23 MED ORDER — LORAZEPAM 0.5 MG PO TABS: 0.5000 mg | ORAL_TABLET | Freq: Every day | ORAL | 1 refills | Status: DC

## 2024-04-07 ENCOUNTER — Other Ambulatory Visit: Payer: Self-pay | Admitting: Student

## 2024-04-07 DIAGNOSIS — Z515 Encounter for palliative care: Secondary | ICD-10-CM | POA: Insufficient documentation

## 2024-04-07 MED ORDER — OXYCODONE HCL 5 MG PO TABS: ORAL_TABLET | ORAL | 0 refills | Status: DC

## 2024-04-07 NOTE — Progress Notes (Signed)
 Medication to schedule for comfort measures.

## 2024-04-20 ENCOUNTER — Other Ambulatory Visit: Payer: Self-pay | Admitting: Nurse Practitioner

## 2024-04-20 DIAGNOSIS — F03918 Unspecified dementia, unspecified severity, with other behavioral disturbance: Secondary | ICD-10-CM

## 2024-04-20 DIAGNOSIS — Z515 Encounter for palliative care: Secondary | ICD-10-CM

## 2024-04-20 MED ORDER — OXYCODONE HCL 5 MG PO TABS: ORAL_TABLET | ORAL | 0 refills | Status: DC

## 2024-04-20 MED ORDER — LORAZEPAM 0.5 MG PO TABS: 0.5000 mg | ORAL_TABLET | Freq: Every day | ORAL | 1 refills | Status: DC

## 2024-04-21 ENCOUNTER — Encounter: Payer: Self-pay | Admitting: Student

## 2024-04-21 ENCOUNTER — Non-Acute Institutional Stay (SKILLED_NURSING_FACILITY): Payer: Self-pay | Admitting: Student

## 2024-04-21 DIAGNOSIS — Z7409 Other reduced mobility: Secondary | ICD-10-CM

## 2024-04-21 DIAGNOSIS — Z515 Encounter for palliative care: Secondary | ICD-10-CM

## 2024-04-21 DIAGNOSIS — E43 Unspecified severe protein-calorie malnutrition: Secondary | ICD-10-CM | POA: Diagnosis not present

## 2024-04-21 DIAGNOSIS — F01C4 Vascular dementia, severe, with anxiety: Secondary | ICD-10-CM | POA: Diagnosis not present

## 2024-04-21 NOTE — Progress Notes (Unsigned)
 Location:  Other [place].  Nursing Home Room Number: [facility]A Place of Service:  SNF [phone]) Provider:  Abdul Fine, MD  Patient Care Team: Abdul Fine, MD as PCP - General (Family Medicine)  Extended Emergency Contact Information Primary Emergency Contact: [name],[name] Mobile Phone: [phone] Relation: Daughter  Code Status:  DNR Goals of care: Advanced Directive information    04/21/2024    9:45 AM  Advanced Directives  Does Patient Have a Medical Advance Directive? Yes  Type of Estate agent of [place];Living will;Out of facility DNR (pink MOST or yellow form)  Does patient want to make changes to medical advance directive? No - Patient declined  Copy of Healthcare Power of Attorney in Chart? Yes - validated most recent copy scanned in chart (See row information)     Chief Complaint  Patient presents with   Medical Management of Chronic Issues    Medical Management of Chronic Issues.     HPI:  Pt is a 83 y.o. female seen today for medical management of chronic diseases.    History of Present Illness The patient presents for a geriatric medicine consultation regarding nutritional status and mobility issues.  She experiences minimal pain, primarily during movement, and describes a sensation of falling rather than actual pain.  Her weight has been unstable, with a recent increase from 98.2 pounds to 103.2 pounds. She consumes Ensure and eats well, although her meal intake varies; she may skip breakfast but eat lunch or vice versa, yet she consumes 100% of her meals when she does eat.  She is unable to walk due to lack of rehabilitation. Her breakfast intake was minimal, with only a few bites consumed, and she acknowledges taking too long to eat.  Past Medical History - Mobility impairment due to lack of rehabilitation    Medications - Ensure powders  Physical Exam MEASUREMENTS: Weight- 103.2.  Assessment and  Plan Impaired mobility Impaired mobility due to lack of rehabilitation. She is unable to walk currently.  Unintentional weight loss, now improved Weight has improved from 98.2 lbs to 103.2 lbs. She is consuming Ensure and eating well, although her meal intake varies between breakfast and lunch.  Feeding difficulty Feeding difficulty as she takes a long time to eat and sometimes only eats part of her meals. However, she is consuming 100% of her meals when she does eat. Past Medical History:  Diagnosis Date   Arthritis    Cancer (HCC)    SKIN CANCER-BASAL CELL   GERD (gastroesophageal reflux disease)    Hyperlipidemia    Hypothyroidism    Thyroid  disease    Past Surgical History:  Procedure Laterality Date   APPENDECTOMY     CARPOMETACARPAL (CMC) FUSION OF THUMB Left 09/10/2017   Procedure: CARPOMETACARPAL (CMC) FUSION OF THUMB;  Surgeon: Cleotilde Barrio, MD;  Location: [facility] ORS;  Service: Orthopedics;  Laterality: Left;   CLOSED REDUCTION WRIST FRACTURE Left 11/15/2023   Procedure: CLOSED REDUCTION, WRIST;  Surgeon: Cleotilde Barrio, MD;  Location: [facility] ORS;  Service: Orthopedics;  Laterality: Left;   HIP ARTHROPLASTY Left 11/15/2023   Procedure: HEMIARTHROPLASTY (BIPOLAR) HIP, POSTERIOR APPROACH FOR FRACTURE;  Surgeon: Cleotilde Barrio, MD;  Location: [facility] ORS;  Service: Orthopedics;  Laterality: Left;   MELANOMA EXCISION     x 2, Dr. Jakie    Allergies  Allergen Reactions   Hydrocodone      Hallucination   Shellfish Allergy Itching   Statins Other (See Comments)    Unknown  Penicillins Rash    Has patient had a PCN reaction causing immediate rash, facial/tongue/throat swelling, SOB or lightheadedness with hypotension: No Has patient had a PCN reaction causing severe rash involving mucus membranes or skin necrosis: No Has patient had a PCN reaction that required hospitalization: No Has patient had a PCN reaction occurring within the last 10 years: No If all of the above  answers are NO, then may proceed with Cephalosporin use.     Outpatient Encounter Medications as of 04/21/2024  Medication Sig   acetaminophen  (TYLENOL ) 325 MG tablet Take 2 tablets (650 mg total) by mouth every 6 (six) hours as needed for mild pain (pain score 1-3) or moderate pain (pain score 4-6) (or temp > 100.5).   LORazepam  (ATIVAN ) 0.5 MG tablet Take 1 tablet (0.5 mg total) by mouth daily.   morphine  (ROXANOL) 20 MG/ML concentrated solution Take 0.25 mLs (5 mg total) by mouth every 2 (two) hours as needed for severe pain (pain score 7-10) or breakthrough pain.   oxyCODONE  (OXY IR/ROXICODONE ) 5 MG immediate release tablet Take 1 tablet twice daily, hold if patient is overly sedated.   oxycodone  (OXY-IR) 5 MG capsule Take 5 mg by mouth every 6 (six) hours as needed.   polyethylene glycol (MIRALAX  / GLYCOLAX ) 17 g packet Take 17 g by mouth daily.   senna (SENOKOT) 8.6 MG TABS tablet 2 tablets at bedtime.   No facility-administered encounter medications on file as of 04/21/2024.    Review of Systems  Immunization History  Administered Date(s) Administered   INFLUENZA, HIGH DOSE SEASONAL PF 06/05/2019, 05/19/2020   Influenza Split 06/05/2011, 05/26/2012, 06/24/2013, 06/09/2014   Influenza-Unspecified 05/17/2015, 05/16/2016, 05/13/2018, 06/08/2018   Moderna Covid-19 Fall Seasonal Vaccine 53yrs & older 08/16/2022   PFIZER(Purple Top)SARS-COV-2 Vaccination 09/16/2019, 10/07/2019, 06/23/2020   PPD Test 04/17/2015, 02/01/2023   Pneumococcal Conjugate-13 06/16/2014   Pneumococcal Polysaccharide-23 07/11/2011, 04/14/2017, 11/22/2023   RSV,unspecified 11/24/2023   Tdap 07/14/2012, 08/13/2021   Zoster Recombinant(Shingrix) 11/19/2017, 02/23/2018   Zoster, Live 07/16/2010   Pertinent  Health Maintenance Due  Topic Date Due   MAMMOGRAM  04/06/2023   INFLUENZA VACCINE  03/26/2024   DEXA SCAN  Completed      01/01/2021   11:23 AM 08/13/2021    3:29 PM 02/05/2022    9:28 AM 03/29/2022     1:09 PM 01/16/2023   11:59 AM  Fall Risk  Falls in the past year? 0  0 1 0  Was there an injury with Fall? 0   0 0  Fall Risk Category Calculator 0   2 0  Fall Risk Category (Retired) Low    Moderate    (RETIRED) Patient Fall Risk Level Low fall risk  High fall risk  Low fall risk  Moderate fall risk    Patient at Risk for Falls Due to    No Fall Risks;History of fall(s) No Fall Risks  Fall risk Follow up Falls evaluation completed   Falls evaluation completed  Falls evaluation completed  Falls evaluation completed     Data saved with a previous flowsheet row definition   Functional Status Survey:    Vitals:   04/21/24 0939  BP: 114/61  Pulse: (!) 53  Resp: 18  Temp: 97.9 F (36.6 C)  SpO2: 99%  Weight: 103 lb 3.2 oz (46.8 kg)  Height: 5' 7 (1.702 m)   Body mass index is 16.16 kg/m. Physical Exam  Labs reviewed: Recent Labs    11/17/23 0523 11/18/23 0445 11/19/23 0459  11/24/23 0000 12/25/23 0000  NA 134* 133* 137 139  --   K 2.9* 4.4 3.8 3.9  --   CL 99 102 107 101  --   CO2 26 25 27  28*  --   GLUCOSE 102* 133* 109*  --   --   BUN 17 21 16 14   --   CREATININE 0.65 0.56 0.55 0.4*  --   CALCIUM 8.4* 8.3* 8.1* 8.0* 9.7  MG 2.2  --   --   --   --    Recent Labs    11/15/23 0827  AST 20  ALT 16  ALKPHOS 60  BILITOT 0.8  PROT 6.8  ALBUMIN 4.0   Recent Labs    11/15/23 0827 11/16/23 0221 11/17/23 0523 11/18/23 0445 11/19/23 0459 11/20/23 0406  WBC 6.6   < > 11.7* 10.1 9.6  --   NEUTROABS 4.9  --   --   --   --   --   HGB 12.7   < > 10.0* 9.4* 8.7* 9.1*  HCT 38.1   < > 29.8* 27.3* 26.4*  --   MCV 94.1   < > 95.5 94.1 97.1  --   PLT 177   < > 122* 128* 150  --    < > = values in this interval not displayed.   Lab Results  Component Value Date   TSH 1.898 11/15/2023   No results found for: HGBA1C Lab Results  Component Value Date   CHOL 254 (H) 01/02/2021   HDL 68.10 01/02/2021   LDLCALC 171 (H) 01/02/2021   LDLDIRECT 172.6 09/08/2013    TRIG 76.0 01/02/2021   CHOLHDL 4 01/02/2021    Significant Diagnostic Results in last 30 days:  No results found.  Assessment/Plan There are no diagnoses linked to this encounter.   Family/ staff Communication: ***  Labs/tests ordered:  ***

## 2024-04-23 ENCOUNTER — Encounter: Payer: Self-pay | Admitting: Student

## 2024-05-17 ENCOUNTER — Other Ambulatory Visit: Payer: Self-pay | Admitting: Adult Health

## 2024-05-17 DIAGNOSIS — F03918 Unspecified dementia, unspecified severity, with other behavioral disturbance: Secondary | ICD-10-CM

## 2024-05-17 MED ORDER — LORAZEPAM 0.5 MG PO TABS: 0.5000 mg | ORAL_TABLET | Freq: Every day | ORAL | 1 refills | Status: DC

## 2024-05-19 ENCOUNTER — Other Ambulatory Visit: Payer: Self-pay | Admitting: Nurse Practitioner

## 2024-05-19 DIAGNOSIS — Z515 Encounter for palliative care: Secondary | ICD-10-CM

## 2024-05-19 MED ORDER — OXYCODONE HCL 5 MG PO TABS: ORAL_TABLET | ORAL | 0 refills | Status: DC

## 2024-06-09 ENCOUNTER — Non-Acute Institutional Stay (SKILLED_NURSING_FACILITY): Payer: Self-pay | Admitting: Nurse Practitioner

## 2024-06-09 ENCOUNTER — Encounter: Payer: Self-pay | Admitting: Nurse Practitioner

## 2024-06-09 DIAGNOSIS — Z7409 Other reduced mobility: Secondary | ICD-10-CM

## 2024-06-09 DIAGNOSIS — Z515 Encounter for palliative care: Secondary | ICD-10-CM

## 2024-06-09 DIAGNOSIS — F03918 Unspecified dementia, unspecified severity, with other behavioral disturbance: Secondary | ICD-10-CM | POA: Diagnosis not present

## 2024-06-09 DIAGNOSIS — E43 Unspecified severe protein-calorie malnutrition: Secondary | ICD-10-CM

## 2024-06-09 NOTE — Progress Notes (Signed)
 Location:  Other [place].  Nursing Home Room Number: [id number] Place of Service:  SNF [phone]) Harlene An, NP  PCP: Abdul Fine, MD  Patient Care Team: Abdul Fine, MD as PCP - General [facility] Medicine)  Extended Emergency Contact Information Primary Emergency Contact: [name],[name] Mobile Phone: [phone] Relation: Daughter  Goals of care: Advanced Directive information    04/21/2024    9:45 AM  Advanced Directives  Does Patient Have a Medical Advance Directive? Yes  Type of Estate agent of [place];Living will;Out of facility DNR (pink MOST or yellow form)  Does patient want to make changes to medical advance directive? No - Patient declined  Copy of Healthcare Power of Attorney in Chart? Yes - validated most recent copy scanned in chart (See row information)     Chief Complaint  Patient presents with   Medical Management of Chronic Issues    Medical Management of Chronic Issues.     HPI:  Pt is a 83 y.o. female seen today for medical management of chronic disease. Pt with hx of dementia, OA, anxiety and currently on hospice. She has 2 small sacral ulcers being treated by nursing that are stable. No signs of pain reported She is total care and staff is feeding her.  Nursing without acute concerns.    Past Medical History:  Diagnosis Date   Arthritis    Cancer (HCC)    SKIN CANCER-BASAL CELL   GERD (gastroesophageal reflux disease)    Hyperlipidemia    Hypothyroidism    Thyroid  disease    Past Surgical History:  Procedure Laterality Date   APPENDECTOMY     CARPOMETACARPAL (CMC) FUSION OF THUMB Left 09/10/2017   Procedure: CARPOMETACARPAL (CMC) FUSION OF THUMB;  Surgeon: Cleotilde Barrio, MD;  Location: [facility] ORS;  Service: Orthopedics;  Laterality: Left;   CLOSED REDUCTION WRIST FRACTURE Left 11/15/2023   Procedure: CLOSED REDUCTION, WRIST;  Surgeon: Cleotilde Barrio, MD;  Location: [facility] ORS;  Service:  Orthopedics;  Laterality: Left;   HIP ARTHROPLASTY Left 11/15/2023   Procedure: HEMIARTHROPLASTY (BIPOLAR) HIP, POSTERIOR APPROACH FOR FRACTURE;  Surgeon: Cleotilde Barrio, MD;  Location: [facility] ORS;  Service: Orthopedics;  Laterality: Left;   MELANOMA EXCISION     x 2, Dr. Jakie    Allergies  Allergen Reactions   Hydrocodone      Hallucination   Shellfish Allergy Itching   Statins Other (See Comments)    Unknown    Penicillins Rash    Has patient had a PCN reaction causing immediate rash, facial/tongue/throat swelling, SOB or lightheadedness with hypotension: No Has patient had a PCN reaction causing severe rash involving mucus membranes or skin necrosis: No Has patient had a PCN reaction that required hospitalization: No Has patient had a PCN reaction occurring within the last 10 years: No If all of the above answers are NO, then may proceed with Cephalosporin use.     Outpatient Encounter Medications as of 06/09/2024  Medication Sig   acetaminophen  (TYLENOL ) 325 MG tablet Take 2 tablets (650 mg total) by mouth every 6 (six) hours as needed for mild pain (pain score 1-3) or moderate pain (pain score 4-6) (or temp > 100.5).   LORazepam  (ATIVAN ) 0.5 MG tablet Take 1 tablet (0.5 mg total) by mouth daily.   morphine  (ROXANOL) 20 MG/ML concentrated solution Take 0.25 mLs (5 mg total) by mouth every 2 (two) hours as needed for severe pain (pain score 7-10) or breakthrough pain.   oxyCODONE  (OXY IR/ROXICODONE ) 5 MG immediate release  tablet Take 1 tablet twice daily, hold if patient is overly sedated.   oxycodone  (OXY-IR) 5 MG capsule Take 5 mg by mouth every 6 (six) hours as needed.   polyethylene glycol (MIRALAX  / GLYCOLAX ) 17 g packet Take 17 g by mouth daily.   senna (SENOKOT) 8.6 MG TABS tablet 2 tablets at bedtime.   No facility-administered encounter medications on file as of 06/09/2024.    Review of Systems  Unable to perform ROS: Dementia     Immunization History   Administered Date(s) Administered   INFLUENZA, HIGH DOSE SEASONAL PF 06/05/2019, 05/19/2020   Influenza Split 06/05/2011, 05/26/2012, 06/24/2013, 06/09/2014   Influenza-Unspecified 05/17/2015, 05/16/2016, 05/13/2018, 06/08/2018, 06/08/2024   Moderna Covid-19 Fall Seasonal Vaccine 30yrs & older 08/16/2022   PFIZER(Purple Top)SARS-COV-2 Vaccination 09/16/2019, 10/07/2019, 06/23/2020   PPD Test 04/17/2015, 02/01/2023   Pneumococcal Conjugate-13 06/16/2014   Pneumococcal Polysaccharide-23 07/11/2011, 04/14/2017, 11/22/2023   RSV,unspecified 11/24/2023   Tdap 07/14/2012, 08/13/2021   Zoster Recombinant(Shingrix) 11/19/2017, 02/23/2018   Zoster, Live 07/16/2010   Pertinent  Health Maintenance Due  Topic Date Due   Mammogram  04/06/2023   Influenza Vaccine  Completed   DEXA SCAN  Completed      01/01/2021   11:23 AM 08/13/2021    3:29 PM 02/05/2022    9:28 AM 03/29/2022    1:09 PM 01/16/2023   11:59 AM  Fall Risk  Falls in the past year? 0  0 1 0  Was there an injury with Fall? 0   0 0  Fall Risk Category Calculator 0   2 0  Fall Risk Category (Retired) Low    Moderate    (RETIRED) Patient Fall Risk Level Low fall risk  High fall risk  Low fall risk  Moderate fall risk    Patient at Risk for Falls Due to    No Fall Risks;History of fall(s) No Fall Risks  Fall risk Follow up Falls evaluation completed   Falls evaluation completed  Falls evaluation completed  Falls evaluation completed     Data saved with a previous flowsheet row definition   Functional Status Survey:    Vitals:   06/09/24 0906  BP: 100/60  Pulse: 72  Resp: 16  Temp: (!) 97.1 F (36.2 C)  SpO2: 96%  Weight: 99 lb 8 oz (45.1 kg)  Height: 5' 7 (1.702 m)   Body mass index is 15.58 kg/m. Physical Exam Constitutional:      General: She is not in acute distress.    Appearance: She is well-developed. She is not diaphoretic.  HENT:     Head: Normocephalic and atraumatic.     Mouth/Throat:     Pharynx: No  oropharyngeal exudate.  Eyes:     Conjunctiva/sclera: Conjunctivae normal.     Pupils: Pupils are equal, round, and reactive to light.  Cardiovascular:     Rate and Rhythm: Normal rate and regular rhythm.     Heart sounds: Normal heart sounds.  Pulmonary:     Effort: Pulmonary effort is normal.     Breath sounds: Normal breath sounds.  Abdominal:     General: Bowel sounds are normal.     Palpations: Abdomen is soft.  Musculoskeletal:     Cervical back: Normal range of motion and neck supple.     Right lower leg: No edema.     Left lower leg: No edema.  Skin:    General: Skin is warm and dry.  Neurological:     Mental Status: She is  alert. She is disoriented.     Motor: Weakness present.     Gait: Gait abnormal.     Labs reviewed: Recent Labs    11/17/23 0523 11/18/23 0445 11/19/23 0459 11/24/23 0000 12/25/23 0000  NA 134* 133* 137 139  --   K 2.9* 4.4 3.8 3.9  --   CL 99 102 107 101  --   CO2 26 25 27  28*  --   GLUCOSE 102* 133* 109*  --   --   BUN 17 21 16 14   --   CREATININE 0.65 0.56 0.55 0.4*  --   CALCIUM 8.4* 8.3* 8.1* 8.0* 9.7  MG 2.2  --   --   --   --    Recent Labs    11/15/23 0827  AST 20  ALT 16  ALKPHOS 60  BILITOT 0.8  PROT 6.8  ALBUMIN 4.0   Recent Labs    11/15/23 0827 11/16/23 0221 11/17/23 0523 11/18/23 0445 11/19/23 0459 11/20/23 0406  WBC 6.6   < > 11.7* 10.1 9.6  --   NEUTROABS 4.9  --   --   --   --   --   HGB 12.7   < > 10.0* 9.4* 8.7* 9.1*  HCT 38.1   < > 29.8* 27.3* 26.4*  --   MCV 94.1   < > 95.5 94.1 97.1  --   PLT 177   < > 122* 128* 150  --    < > = values in this interval not displayed.   Lab Results  Component Value Date   TSH 1.898 11/15/2023   No results found for: HGBA1C Lab Results  Component Value Date   CHOL 254 (H) 01/02/2021   HDL 68.10 01/02/2021   LDLCALC 171 (H) 01/02/2021   LDLDIRECT 172.6 09/08/2013   TRIG 76.0 01/02/2021   CHOLHDL 4 01/02/2021    Significant Diagnostic Results in last  30 days:  No results found.  Assessment/Plan 1. Dementia with behavioral disturbance (HCC) (Primary) Advanced disease, continues to receive total care from staff.   2. Impaired mobility Bedbound, continue frequent turning to avoid progression of pressure sores  3. Severe protein-calorie malnutrition Expect to worsen with progression of dementia  4. Hospice care patient No signs of pain at this time Has oxycodone  PRN Will stop daily scheduled oxycodone  and add tylenol  325 mg 2 tablets BID  5. Anxiety Controlled, continue ativan  at bedtime   Rodert Hinch K. Caro BODILY [facility] [phone]

## 2024-06-21 ENCOUNTER — Other Ambulatory Visit: Payer: Self-pay | Admitting: Orthopedic Surgery

## 2024-06-21 DIAGNOSIS — F03918 Unspecified dementia, unspecified severity, with other behavioral disturbance: Secondary | ICD-10-CM

## 2024-06-21 MED ORDER — LORAZEPAM 0.5 MG PO TABS: 0.5000 mg | ORAL_TABLET | Freq: Every day | ORAL | 1 refills | Status: DC

## 2024-07-14 ENCOUNTER — Encounter: Payer: Self-pay | Admitting: Orthopedic Surgery

## 2024-07-14 ENCOUNTER — Non-Acute Institutional Stay (SKILLED_NURSING_FACILITY): Payer: Self-pay | Admitting: Orthopedic Surgery

## 2024-07-14 DIAGNOSIS — L89153 Pressure ulcer of sacral region, stage 3: Secondary | ICD-10-CM | POA: Diagnosis not present

## 2024-07-14 DIAGNOSIS — F01C4 Vascular dementia, severe, with anxiety: Secondary | ICD-10-CM | POA: Diagnosis not present

## 2024-07-14 DIAGNOSIS — E43 Unspecified severe protein-calorie malnutrition: Secondary | ICD-10-CM | POA: Diagnosis not present

## 2024-07-14 DIAGNOSIS — H6121 Impacted cerumen, right ear: Secondary | ICD-10-CM | POA: Diagnosis not present

## 2024-07-14 NOTE — Progress Notes (Signed)
 Location:  Other [facility] Room Number: [facility]  Place of Service:  SNF [phone]) Provider:  Greig Cluster, NP  Laurence Locus, DO  Patient Care Team: Laurence Locus, DO as PCP - General (Internal Medicine) Caro Harlene POUR, NP as Nurse Practitioner (Geriatric Medicine)  Extended Emergency Contact Information Primary Emergency Contact: [name],[name] Mobile Phone: [phone] Relation: Daughter  Code Status:  DNR Goals of care: Advanced Directive information    07/14/2024    9:30 AM  Advanced Directives  Does Patient Have a Medical Advance Directive? Yes  Type of Advance Directive Out of facility DNR (pink MOST or yellow form)  Does patient want to make changes to medical advance directive? No - Patient declined     Chief Complaint  Patient presents with   Medical Management of Chronic Issues    HPI:  Pt is a 83 y.o. female seen today for medical management of chronic diseases.    She currently resides on the rehab unit at [facility]. PMH: IBS, hypothyroidism, BPPV, vascular dementia, OA, recurrent UTI, thrombocytopenia, anemia and anxiety.   Followed by hospice.   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 and air mattress  Recent weights:  11/05- 98 lbs  10/01- 99.5 lbs   09/03- 100 lbs   Recent blood pressures:  11/16- 80/49  11/12- 108/55  11/11- 112/63    Past Medical History:  Diagnosis Date   Arthritis    Cancer (HCC)    SKIN CANCER-BASAL CELL   GERD (gastroesophageal reflux disease)    Hyperlipidemia    Hypothyroidism    Thyroid  disease    Past Surgical History:  Procedure Laterality Date   APPENDECTOMY     CARPOMETACARPAL (CMC) FUSION OF THUMB Left 09/10/2017   Procedure: CARPOMETACARPAL (CMC) FUSION OF THUMB;  Surgeon: Cleotilde Barrio, MD;  Location: [facility] ORS;  Service: Orthopedics;  Laterality: Left;   CLOSED REDUCTION WRIST FRACTURE Left 11/15/2023   Procedure: CLOSED REDUCTION, WRIST;  Surgeon: Cleotilde Barrio, MD;  Location: [facility] ORS;  Service: Orthopedics;  Laterality: Left;   HIP ARTHROPLASTY Left 11/15/2023   Procedure: HEMIARTHROPLASTY (BIPOLAR) HIP, POSTERIOR APPROACH FOR FRACTURE;  Surgeon: Cleotilde Barrio, MD;  Location: [facility] ORS;  Service: Orthopedics;  Laterality: Left;   MELANOMA EXCISION     x 2, Dr. Jakie    Allergies  Allergen Reactions   Hydrocodone      Hallucination   Shellfish Allergy Itching   Statins Other (See Comments)    Unknown    Penicillins Rash    Has patient had a PCN reaction causing immediate rash, facial/tongue/throat swelling, SOB or lightheadedness with hypotension: No Has patient had a PCN reaction causing severe rash involving mucus membranes or skin necrosis: No Has patient had a PCN reaction that required hospitalization: No Has patient had a PCN reaction occurring within the last 10 years: No If all of the above answers are NO, then may proceed with Cephalosporin use.     Outpatient Encounter Medications as of 07/14/2024  Medication Sig   acetaminophen  (TYLENOL ) 325 MG tablet Take 2 tablets (650 mg total) by mouth every 6 (six) hours as needed for mild pain (pain score 1-3) or moderate pain (pain score 4-6) (or temp > 100.5).   acetaminophen  (TYLENOL ) 325 MG tablet Take 650 mg by mouth 2 (two) times daily.  LORazepam  (ATIVAN ) 0.5 MG tablet Take 1 tablet (0.5 mg total) by mouth daily.   oxycodone  (OXY-IR) 5 MG capsule Take 5 mg by mouth every 6 (six) hours as needed.   senna (SENOKOT) 8.6 MG TABS tablet 2 tablets at bedtime.   morphine  (ROXANOL) 20 MG/ML concentrated solution Take 0.25 mLs (5 mg total) by mouth every 2 (two) hours as needed for severe pain (pain score 7-10) or breakthrough pain. (Patient not taking: Reported on 07/14/2024)   oxyCODONE  (OXY  IR/ROXICODONE ) 5 MG immediate release tablet Take 1 tablet twice daily, hold if patient is overly sedated. (Patient not taking: Reported on 07/14/2024)   polyethylene glycol (MIRALAX  / GLYCOLAX ) 17 g packet Take 17 g by mouth daily. (Patient not taking: Reported on 07/14/2024)   No facility-administered encounter medications on file as of 07/14/2024.    Review of Systems  Immunization History  Administered Date(s) Administered   INFLUENZA, HIGH DOSE SEASONAL PF 06/05/2019, 05/19/2020   Influenza Split 06/05/2011, 05/26/2012, 06/24/2013, 06/09/2014   Influenza-Unspecified 05/17/2015, 05/16/2016, 05/13/2018, 06/08/2018, 06/08/2024   Moderna Covid-19 Fall Seasonal Vaccine 26yrs & older 08/16/2022   PFIZER(Purple Top)SARS-COV-2 Vaccination 09/16/2019, 10/07/2019, 06/23/2020   PPD Test 04/17/2015, 02/01/2023   Pneumococcal Conjugate-13 06/16/2014   Pneumococcal Polysaccharide-23 07/11/2011, 04/14/2017, 11/22/2023   RSV,unspecified 11/24/2023   Tdap 07/14/2012, 08/13/2021   Zoster Recombinant(Shingrix) 11/19/2017, 02/23/2018   Zoster, Live 07/16/2010   Pertinent  Health Maintenance Due  Topic Date Due   Mammogram  04/06/2023   Influenza Vaccine  Completed   DEXA SCAN  Completed      01/01/2021   11:23 AM 08/13/2021    3:29 PM 02/05/2022    9:28 AM 03/29/2022    1:09 PM 01/16/2023   11:59 AM  Fall Risk  Falls in the past year? 0  0 1 0  Was there an injury with Fall? 0   0 0  Fall Risk Category Calculator 0   2 0  Fall Risk Category (Retired) Low    Moderate    (RETIRED) Patient Fall Risk Level Low fall risk  High fall risk  Low fall risk  Moderate fall risk    Patient at Risk for Falls Due to    No Fall Risks;History of fall(s) No Fall Risks  Fall risk Follow up Falls evaluation completed   Falls evaluation completed  Falls evaluation completed  Falls evaluation completed     Data saved with a previous flowsheet row definition   Functional Status Survey:    Vitals:   07/14/24  0927  BP: (!) 80/49  Pulse: 67  Resp: 17  Temp: (!) 97.3 F (36.3 C)  SpO2: 98%  Weight: 98 lb (44.5 kg)  Height: 5' 7 (1.702 m)   Body mass index is 15.35 kg/m. Physical Exam Vitals reviewed.  Constitutional:      General: She is not in acute distress. HENT:     Head: Normocephalic.     Right Ear: There is impacted cerumen.     Left Ear: There is impacted cerumen.     Ears:     Comments: Right ear with foul odor    Nose: Nose normal.     Mouth/Throat:     Mouth: Mucous membranes are moist.  Eyes:     General:        Right eye: No discharge.        Left eye: No discharge.  Cardiovascular:     Rate and Rhythm: Normal rate and regular rhythm.  Pulses: Normal pulses.     Heart sounds: Normal heart sounds.  Pulmonary:     Effort: Pulmonary effort is normal.     Breath sounds: Normal breath sounds.  Abdominal:     General: Bowel sounds are normal. There is no distension.     Palpations: Abdomen is soft.     Tenderness: There is no abdominal tenderness.  Musculoskeletal:     Cervical back: Neck supple.     Right lower leg: No edema.     Left lower leg: No edema.  Skin:    General: Skin is warm.     Capillary Refill: Capillary refill takes less than 2 seconds.  Neurological:     General: No focal deficit present.     Mental Status: She is alert. Mental status is at baseline.     Motor: Weakness present.     Gait: Gait abnormal.  Psychiatric:        Mood and Affect: Mood normal.     Comments: Very pleasant, alert to self, does not follow commands     Labs reviewed: Recent Labs    11/17/23 0523 11/18/23 0445 11/19/23 0459 11/24/23 0000 12/25/23 0000  NA 134* 133* 137 139  --   K 2.9* 4.4 3.8 3.9  --   CL 99 102 107 101  --   CO2 26 25 27  28*  --   GLUCOSE 102* 133* 109*  --   --   BUN 17 21 16 14   --   CREATININE 0.65 0.56 0.55 0.4*  --   CALCIUM 8.4* 8.3* 8.1* 8.0* 9.7  MG 2.2  --   --   --   --    Recent Labs    11/15/23 0827  AST 20  ALT  16  ALKPHOS 60  BILITOT 0.8  PROT 6.8  ALBUMIN 4.0   Recent Labs    11/15/23 0827 11/16/23 0221 11/17/23 0523 11/18/23 0445 11/19/23 0459 11/20/23 0406  WBC 6.6   < > 11.7* 10.1 9.6  --   NEUTROABS 4.9  --   --   --   --   --   HGB 12.7   < > 10.0* 9.4* 8.7* 9.1*  HCT 38.1   < > 29.8* 27.3* 26.4*  --   MCV 94.1   < > 95.5 94.1 97.1  --   PLT 177   < > 122* 128* 150  --    < > = values in this interval not displayed.   Lab Results  Component Value Date   TSH 1.898 11/15/2023   No results found for: HGBA1C Lab Results  Component Value Date   CHOL 254 (H) 01/02/2021   HDL 68.10 01/02/2021   LDLCALC 171 (H) 01/02/2021   LDLDIRECT 172.6 09/08/2013   TRIG 76.0 01/02/2021   CHOLHDL 4 01/02/2021    Significant Diagnostic Results in last 30 days:  No results found.  Assessment/Plan 1. Impacted cerumen of right ear (Primary) - foul odor to right ear - start Debrox x 5 days - flush ear when Debrox complete> if tolerated  2. Severe protein-calorie malnutrition - BMI 15.35 - followed by dietary - cont Ensure  3. Severe vascular dementia with anxiety (HCC) - followed by hospice - no behaviors - dependent with ADLs - mild weight loss - cont ativan   4. Pressure injury of sacral region, stage 3 (HCC) - associated with decreased mobility and malnutrition - cont Ensure  - cont calcium alginate dressing changes  Family/ staff Communication: plan discussed with patient and nurse  Labs/tests ordered:  none

## 2024-07-14 NOTE — Progress Notes (Signed)
ERROR    This encounter was created in error - please disregard.

## 2024-08-05 ENCOUNTER — Non-Acute Institutional Stay (SKILLED_NURSING_FACILITY): Admitting: Nurse Practitioner

## 2024-08-05 ENCOUNTER — Encounter: Payer: Self-pay | Admitting: Nurse Practitioner

## 2024-08-05 DIAGNOSIS — K5903 Drug induced constipation: Secondary | ICD-10-CM | POA: Diagnosis not present

## 2024-08-05 DIAGNOSIS — L89153 Pressure ulcer of sacral region, stage 3: Secondary | ICD-10-CM | POA: Insufficient documentation

## 2024-08-05 DIAGNOSIS — E43 Unspecified severe protein-calorie malnutrition: Secondary | ICD-10-CM

## 2024-08-05 DIAGNOSIS — F01C4 Vascular dementia, severe, with anxiety: Secondary | ICD-10-CM

## 2024-08-05 NOTE — Assessment & Plan Note (Signed)
 Advaned dementia, continues on hospice care with support of staff and family

## 2024-08-05 NOTE — Assessment & Plan Note (Signed)
 Continues with routine wound care, pressure reduction and increase in protein.

## 2024-08-05 NOTE — Progress Notes (Signed)
 Location:  [facility]  Nursing Home Room Number: [facility] Place of Service:  SNF [phone]) Harlene An, NP  PCP: Laurence Locus, DO  Patient Care Team: Laurence Locus, DO as PCP - General (Internal Medicine) An Harlene POUR, NP as Nurse Practitioner (Geriatric Medicine)  Extended Emergency Contact Information Primary Emergency Contact: [name],[name] Mobile Phone: [phone] Relation: Daughter  Goals of care: Advanced Directive information    07/14/2024    9:30 AM  Advanced Directives  Does Patient Have a Medical Advance Directive? Yes  Type of Advance Directive Out of facility DNR (pink MOST or yellow form)  Does patient want to make changes to medical advance directive? No - Patient declined     Chief Complaint  Patient presents with   Medical Management of Chronic Issues    Routine follow up    HPI:  Pt is a 83 y.o. female seen today for medical management of chronic disease.  Pt with hx of dementia, hypothyroid, multiple falls at [facility] for long term care and hospice.  Staff reports she is eating better and actually enjoys eating at this time She has stage 3 pressure injury to sacrum which has improved from unstageable.  She denies pain and does not have any signs of pain at this time.  No worsening of anxiety or depression.   Past Medical History:  Diagnosis Date   Arthritis    Cancer (HCC)    SKIN CANCER-BASAL CELL   GERD (gastroesophageal reflux disease)    Hyperlipidemia    Hypothyroidism    Thyroid  disease    Past Surgical History:  Procedure Laterality Date   APPENDECTOMY     CARPOMETACARPAL (CMC) FUSION OF THUMB Left 09/10/2017   Procedure: CARPOMETACARPAL (CMC) FUSION OF THUMB;  Surgeon: Cleotilde Barrio, MD;  Location: [facility] ORS;  Service: Orthopedics;  Laterality: Left;   CLOSED REDUCTION WRIST FRACTURE Left 11/15/2023   Procedure: CLOSED REDUCTION, WRIST;  Surgeon: Cleotilde Barrio, MD;  Location: [facility] ORS;  Service: Orthopedics;   Laterality: Left;   HIP ARTHROPLASTY Left 11/15/2023   Procedure: HEMIARTHROPLASTY (BIPOLAR) HIP, POSTERIOR APPROACH FOR FRACTURE;  Surgeon: Cleotilde Barrio, MD;  Location: [facility] ORS;  Service: Orthopedics;  Laterality: Left;   MELANOMA EXCISION     x 2, Dr. Jakie    Allergies[1]  Outpatient Encounter Medications as of 08/05/2024  Medication Sig   acetaminophen  (TYLENOL ) 325 MG tablet Take 2 tablets (650 mg total) by mouth every 6 (six) hours as needed for mild pain (pain score 1-3) or moderate pain (pain score 4-6) (or temp > 100.5).   acetaminophen  (TYLENOL ) 325 MG tablet Take 650 mg by mouth 2 (two) times daily.   LORazepam  (ATIVAN ) 0.5 MG tablet Take 1 tablet (0.5 mg total) by mouth daily.   oxycodone  (OXY-IR) 5 MG capsule Take 5 mg by mouth every 6 (six) hours as needed.   senna (SENOKOT) 8.6 MG TABS tablet 2 tablets at bedtime.   morphine  (ROXANOL) 20 MG/ML concentrated solution Take 0.25 mLs (5 mg total) by mouth every 2 (two) hours as needed for severe pain (pain score 7-10) or breakthrough pain. (Patient not taking: Reported on 08/05/2024)   No facility-administered encounter medications on file as of 08/05/2024.    Review of Systems  Unable to perform ROS: Dementia     Immunization History  Administered Date(s) Administered   INFLUENZA, HIGH DOSE SEASONAL PF 06/05/2019, 05/19/2020   Influenza Split 06/05/2011, 05/26/2012, 06/24/2013, 06/09/2014   Influenza-Unspecified 05/17/2015, 05/16/2016, 05/13/2018, 06/08/2018, 06/08/2024   Moderna  Covid-19 Fall Seasonal Vaccine 33yrs & older 08/16/2022   PFIZER(Purple Top)SARS-COV-2 Vaccination 09/16/2019, 10/07/2019, 06/23/2020   PPD Test 04/17/2015, 02/01/2023   Pneumococcal Conjugate-13 06/16/2014   Pneumococcal Polysaccharide-23 07/11/2011, 04/14/2017, 11/22/2023   RSV,unspecified 11/24/2023   Tdap 07/14/2012, 08/13/2021   Zoster Recombinant(Shingrix) 11/19/2017, 02/23/2018   Zoster, Live 07/16/2010   Pertinent  Health  Maintenance Due  Topic Date Due   Mammogram  04/06/2023   Influenza Vaccine  Completed   Bone Density Scan  Completed      08/13/2021    3:29 PM 02/05/2022    9:28 AM 03/29/2022    1:09 PM 01/16/2023   11:59 AM 07/14/2024   12:26 PM  Fall Risk  Falls in the past year?  0 1 0 1  Was there an injury with Fall?   0  0  0   Fall Risk Category Calculator   2 0 1  Fall Risk Category (Retired)   Moderate     (RETIRED) Patient Fall Risk Level High fall risk  Low fall risk  Moderate fall risk     Patient at Risk for Falls Due to   No Fall Risks;History of fall(s) No Fall Risks History of fall(s)  Fall risk Follow up  Falls evaluation completed  Falls evaluation completed  Falls evaluation completed Falls evaluation completed     Data saved with a previous flowsheet row definition   Functional Status Survey:    Vitals:   08/05/24 0932  BP: 106/64  Pulse: 63  Resp: 20  Temp: (!) 97.2 F (36.2 C)  SpO2: 98%  Weight: 99 lb (44.9 kg)  Height: 5' 7 (1.702 m)   Body mass index is 15.51 kg/m.  Wt Readings from Last 3 Encounters:  08/05/24 99 lb (44.9 kg)  07/14/24 98 lb (44.5 kg)  06/09/24 99 lb 8 oz (45.1 kg)    Physical Exam Constitutional:      General: She is not in acute distress.    Appearance: She is well-developed. She is not diaphoretic.  HENT:     Head: Normocephalic and atraumatic.     Mouth/Throat:     Pharynx: No oropharyngeal exudate.  Eyes:     Conjunctiva/sclera: Conjunctivae normal.     Pupils: Pupils are equal, round, and reactive to light.  Cardiovascular:     Rate and Rhythm: Normal rate and regular rhythm.     Heart sounds: Normal heart sounds.  Pulmonary:     Effort: Pulmonary effort is normal.     Breath sounds: Normal breath sounds.  Abdominal:     General: Bowel sounds are normal.     Palpations: Abdomen is soft.  Musculoskeletal:        General: No tenderness.     Cervical back: Normal range of motion and neck supple.  Skin:    General: Skin  is warm and dry.  Neurological:     Mental Status: She is alert and oriented to person, place, and time.     Labs reviewed: Recent Labs    11/17/23 0523 11/18/23 0445 11/19/23 0459 11/24/23 0000 12/25/23 0000  NA 134* 133* 137 139  --   K 2.9* 4.4 3.8 3.9  --   CL 99 102 107 101  --   CO2 26 25 27  28*  --   GLUCOSE 102* 133* 109*  --   --   BUN 17 21 16 14   --   CREATININE 0.65 0.56 0.55 0.4*  --   CALCIUM 8.4* 8.3*  8.1* 8.0* 9.7  MG 2.2  --   --   --   --    Recent Labs    11/15/23 0827  AST 20  ALT 16  ALKPHOS 60  BILITOT 0.8  PROT 6.8  ALBUMIN 4.0   Recent Labs    11/15/23 0827 11/16/23 0221 11/17/23 0523 11/18/23 0445 11/19/23 0459 11/20/23 0406  WBC 6.6   < > 11.7* 10.1 9.6  --   NEUTROABS 4.9  --   --   --   --   --   HGB 12.7   < > 10.0* 9.4* 8.7* 9.1*  HCT 38.1   < > 29.8* 27.3* 26.4*  --   MCV 94.1   < > 95.5 94.1 97.1  --   PLT 177   < > 122* 128* 150  --    < > = values in this interval not displayed.   Lab Results  Component Value Date   TSH 1.898 11/15/2023   No results found for: HGBA1C Lab Results  Component Value Date   CHOL 254 (H) 01/02/2021   HDL 68.10 01/02/2021   LDLCALC 171 (H) 01/02/2021   LDLDIRECT 172.6 09/08/2013   TRIG 76.0 01/02/2021   CHOLHDL 4 01/02/2021    Significant Diagnostic Results in last 30 days:  No results found.  Assessment/Plan Vascular dementia (HCC) Advaned dementia, continues on hospice care with support of staff and family  Severe protein-calorie malnutrition Stable weight at this time however expect to worsen with degree of dementia. Continues on supplement and support with meal times.   Pressure injury of sacral region, stage 3 (HCC) Continues with routine wound care, pressure reduction and increase in protein.   Drug-induced constipation Well controlled on senna 2 tabs at bedtime     Kaydance Bowie K. Caro BODILY [facility] [phone]      [1]   Allergies Allergen Reactions   Hydrocodone      Hallucination   Shellfish Allergy Itching   Statins Other (See Comments)    Unknown    Penicillins Rash    Has patient had a PCN reaction causing immediate rash, facial/tongue/throat swelling, SOB or lightheadedness with hypotension: No Has patient had a PCN reaction causing severe rash involving mucus membranes or skin necrosis: No Has patient had a PCN reaction that required hospitalization: No Has patient had a PCN reaction occurring within the last 10 years: No If all of the above answers are NO, then may proceed with Cephalosporin use.

## 2024-08-05 NOTE — Assessment & Plan Note (Signed)
 Well controlled on senna 2 tabs at bedtime

## 2024-08-05 NOTE — Assessment & Plan Note (Signed)
" >>  ASSESSMENT AND PLAN FOR VASCULAR DEMENTIA (HCC) WRITTEN ON 08/05/2024  2:57 PM BY Alayja Armas K, NP  Advaned dementia, continues on hospice care with support of staff and family "

## 2024-08-05 NOTE — Assessment & Plan Note (Signed)
 Stable weight at this time however expect to worsen with degree of dementia. Continues on supplement and support with meal times.

## 2024-09-02 ENCOUNTER — Encounter: Payer: Self-pay | Admitting: Internal Medicine

## 2024-09-02 ENCOUNTER — Non-Acute Institutional Stay (SKILLED_NURSING_FACILITY): Admitting: Internal Medicine

## 2024-09-02 DIAGNOSIS — F01B4 Vascular dementia, moderate, with anxiety: Secondary | ICD-10-CM

## 2024-09-02 DIAGNOSIS — K5903 Drug induced constipation: Secondary | ICD-10-CM

## 2024-09-02 DIAGNOSIS — L89153 Pressure ulcer of sacral region, stage 3: Secondary | ICD-10-CM | POA: Diagnosis not present

## 2024-09-02 DIAGNOSIS — F03918 Unspecified dementia, unspecified severity, with other behavioral disturbance: Secondary | ICD-10-CM | POA: Diagnosis not present

## 2024-09-02 DIAGNOSIS — Z515 Encounter for palliative care: Secondary | ICD-10-CM

## 2024-09-02 DIAGNOSIS — E43 Unspecified severe protein-calorie malnutrition: Secondary | ICD-10-CM

## 2024-09-02 NOTE — Assessment & Plan Note (Addendum)
 09/02/2024 Body mass index is 15.43 kg/m.

## 2024-09-02 NOTE — Assessment & Plan Note (Addendum)
 09/02/2024 stable dementia.

## 2024-09-02 NOTE — Assessment & Plan Note (Addendum)
 09/02/2024 wound care RN following patient.

## 2024-09-02 NOTE — Assessment & Plan Note (Addendum)
 09/02/2024 continue with scheduled Ativan  and as needed oxycodone .  Hospice is actively seeing the patient at [facility].  Patient is DNR/DNI.

## 2024-09-02 NOTE — Progress Notes (Signed)
 [facility] Routine Visit Progress Note    Location:  Other [place].  Nursing Home Room Number: [facility] [id number] Place of Service:  SNF (31)   PCP: Laurence Locus, DO   Patient Care Team: Laurence Locus, DO as PCP - General (Internal Medicine) Caro Harlene POUR, NP as Nurse Practitioner (Geriatric Medicine)   Extended Emergency Contact Information Primary Emergency Contact: [name],[name] Mobile Phone: [phone] Relation: Daughter   Goals of care: Advanced Directive information    07/14/2024    9:30 AM  Advanced Directives  Does Patient Have a Medical Advance Directive? Yes  Type of Advance Directive Out of facility DNR (pink MOST or yellow form)  Does patient want to make changes to medical advance directive? No - Patient declined    CODE STATUS: Do Not Resuscitate (DNR)   Chief Complaint  Patient presents with   Medical Management of Chronic Issues    Medical Management of Chronic Issues.      HPI: Pt is a 84 y.o. female seen today for medical management of chronic disease.   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  She is currently only getting as needed oxycodone  and scheduled Ativan .  She has some Tylenol , scheduled senna and as needed MiraLAX .  Her appetite is moderate.  CNA states that she ate a decent breakfast and about 50% of her lunch.   Patient does not have any complaints but she is confused.  She is well-dressed and clean.  She has a [employer] on her bed.  There is no family at bedside.  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 04/07/2017   Dizziness 01/24/2023   GERD (gastroesophageal reflux disease)    Hyperlipidemia    Hypothyroidism    Impingement syndrome of left shoulder region 02/28/2017   Insomnia due to psychological stress 03/29/2022   Irritable bowel syndrome 09/08/2013   Left wrist fracture, sequela 11/19/2023   Lumbago without sciatica 10/20/2018   Preoperative evaluation to rule out surgical contraindication 07/15/2012   Recurrent UTI 12/25/2023   Spinal stenosis in cervical region 05/14/2015   Statin intolerance 07/14/2012   Thrombocytopenia 11/19/2023   Thyroid  disease    Unintentional weight loss of 10% body weight within 6 months 03/29/2022   Past Surgical History:  Procedure Laterality Date   APPENDECTOMY     CARPOMETACARPAL (CMC) FUSION OF THUMB Left 09/10/2017   Procedure: CARPOMETACARPAL (CMC) FUSION OF THUMB;  Surgeon: Cleotilde Barrio, MD;  Location: [facility] ORS;  Service: Orthopedics;  Laterality: Left;   CLOSED REDUCTION WRIST FRACTURE Left 11/15/2023   Procedure: CLOSED REDUCTION, WRIST;  Surgeon: Cleotilde Barrio, MD;  Location: [facility] OR[facility];  Service: Orthopedics;  Laterality: Left;   HIP ARTHROPLASTY Left 11/15/2023   Procedure: HEMIARTHROPLASTY (BIPOLAR) HIP, POSTERIOR APPROACH FOR FRACTURE;  Surgeon: Cleotilde Barrio, MD;  Location: [facility] ORS;  Service: Orthopedics;  Laterality: Left;  MELANOMA EXCISION     x 2, Dr. Jakie     Allergies[1]   Outpatient Encounter Medications as of 09/02/2024  Medication Sig   acetaminophen  (TYLENOL ) 325 MG tablet Take 2 tablets (650 mg total) by mouth every 6 (six) hours as needed for mild pain (pain score 1-3) or moderate pain (pain score 4-6) (or temp > 100.5).   acetaminophen  (TYLENOL ) 325 MG  tablet Take 650 mg by mouth 2 (two) times daily.   LORazepam  (ATIVAN ) 0.5 MG tablet Take 1 tablet (0.5 mg total) by mouth daily.   oxycodone  (OXY-IR) 5 MG capsule Take 5 mg by mouth every 6 (six) hours as needed.   polyethylene glycol (MIRALAX  / GLYCOLAX ) 17 g packet Take 17 g by mouth every 12 (twelve) hours as needed.   senna (SENOKOT) 8.6 MG TABS tablet 2 tablets at bedtime.   No facility-administered encounter medications on file as of 09/02/2024.     Review of Systems  Unable to perform ROS: Dementia      Immunization History  Administered Date(s) Administered   INFLUENZA, HIGH DOSE SEASONAL PF 06/05/2019, 05/19/2020   Influenza Split 06/05/2011, 05/26/2012, 06/24/2013, 06/09/2014   Influenza-Unspecified 05/17/2015, 05/16/2016, 05/13/2018, 06/08/2018, 06/08/2024   Moderna Covid-19 Fall Seasonal Vaccine 54yrs & older 08/16/2022   PFIZER(Purple Top)SARS-COV-2 Vaccination 09/16/2019, 10/07/2019, 06/23/2020   PPD Test 04/17/2015, 02/01/2023   Pneumococcal Conjugate-13 06/16/2014   Pneumococcal Polysaccharide-23 07/11/2011, 04/14/2017, 11/22/2023   RSV,unspecified 11/24/2023   Tdap 07/14/2012, 08/13/2021   Unspecified SARS-COV-2 Vaccination 06/18/2024   Zoster Recombinant(Shingrix) 11/19/2017, 02/23/2018   Zoster, Live 07/16/2010   Pertinent  Health Maintenance Due  Topic Date Due   Mammogram  04/06/2023   Influenza Vaccine  Completed   Bone Density Scan  Completed      08/13/2021    3:29 PM 02/05/2022    9:28 AM 03/29/2022    1:09 PM 01/16/2023   11:59 AM 07/14/2024   12:26 PM  Fall Risk  Falls in the past year?  0 1 0 1  Was there an injury with Fall?   0  0  0   Fall Risk Category Calculator   2 0 1  Fall Risk Category (Retired)   Moderate     (RETIRED) Patient Fall Risk Level High fall risk  Low fall risk  Moderate fall risk     Patient at Risk for Falls Due to   No Fall Risks;History of fall(s) No Fall Risks History of fall(s)  Fall risk Follow up  Falls evaluation  completed  Falls evaluation completed  Falls evaluation completed Falls evaluation completed     Data saved with a previous flowsheet row definition   Functional Status Survey:     Vitals:   09/02/24 1529  BP: 114/60  Pulse: 60  Resp: 18  Temp: (!) 97.3 F (36.3 C)  SpO2: 97%  Weight: 98 lb 8 oz (44.7 kg)  Height: 5' 7 (1.702 m)   Body mass index is 15.43 kg/m. Physical Exam Vitals and nursing note reviewed.  Constitutional:      General: She is not in acute distress.    Comments: Awake, thin, frail-appearing.  HENT:     Head: Normocephalic.  Eyes:     General: No scleral icterus. Cardiovascular:     Rate and Rhythm: Normal rate and regular rhythm.  Pulmonary:     Effort: Pulmonary effort is normal.     Breath sounds: Normal breath sounds.  Abdominal:     General: Abdomen is flat.  Bowel sounds are normal.     Palpations: Abdomen is soft.  Musculoskeletal:     Right lower leg: No edema.     Left lower leg: No edema.  Skin:    General: Skin is warm and dry.     Capillary Refill: Capillary refill takes less than 2 seconds.  Neurological:     Mental Status: She is disoriented.      Labs reviewed: Recent Labs    11/17/23 0523 11/18/23 0445 11/19/23 0459 11/24/23 0000 12/25/23 0000  NA 134* 133* 137 139  --   K 2.9* 4.4 3.8 3.9  --   CL 99 102 107 101  --   CO2 26 25 27  28*  --   GLUCOSE 102* 133* 109*  --   --   BUN 17 21 16 14   --   CREATININE 0.65 0.56 0.55 0.4*  --   CALCIUM 8.4* 8.3* 8.1* 8.0* 9.7  MG 2.2  --   --   --   --    Recent Labs    11/15/23 0827  AST 20  ALT 16  ALKPHOS 60  BILITOT 0.8  PROT 6.8  ALBUMIN 4.0   Recent Labs    11/15/23 0827 11/16/23 0221 11/17/23 0523 11/18/23 0445 11/19/23 0459 11/20/23 0406  WBC 6.6   < > 11.7* 10.1 9.6  --   NEUTROABS 4.9  --   --   --   --   --   HGB 12.7   < > 10.0* 9.4* 8.7* 9.1*  HCT 38.1   < > 29.8* 27.3* 26.4*  --   MCV 94.1   < > 95.5 94.1 97.1  --   PLT 177   < > 122* 128* 150   --    < > = values in this interval not displayed.   Lab Results  Component Value Date   TSH 1.898 11/15/2023   No results found for: HGBA1C Lab Results  Component Value Date   CHOL 254 (H) 01/02/2021   HDL 68.10 01/02/2021   LDLCALC 171 (H) 01/02/2021   LDLDIRECT 172.6 09/08/2013   TRIG 76.0 01/02/2021   CHOLHDL 4 01/02/2021     Assessment/Plan Hospice care patient 09/02/2024 continue with scheduled Ativan  and as needed oxycodone .  Hospice is actively seeing the patient at [facility].  Patient is DNR/DNI.     Moderate vascular dementia with anxiety (HCC) 09/02/2024 stable dementia.   Severe protein-calorie malnutrition 09/02/2024 Body mass index is 15.43 kg/m.    Dementia with behavioral disturbance (HCC) 09/02/2024 stable dementia.   Pressure injury of sacral region, stage 3 (HCC) 09/02/2024 wound care RN following patient.   Drug-induced constipation 09/02/2024 on scheduled senna and as needed MiraLAX .   Samantha Door, DO [facility] [phone]      [1]  Allergies Allergen Reactions   Hydrocodone      Hallucination   Shellfish Allergy Itching   Statins Other (See Comments)    Unknown    Penicillins Rash    Has patient had a PCN reaction causing immediate rash, facial/tongue/throat swelling, SOB or lightheadedness with hypotension: No Has patient had a PCN reaction causing severe rash involving mucus membranes or skin necrosis: No Has patient had a PCN reaction that required hospitalization: No Has patient had a PCN reaction occurring within the last 10 years: No If all of the above answers are NO, then may proceed with Cephalosporin use.

## 2024-09-02 NOTE — Assessment & Plan Note (Addendum)
 09/02/2024 on scheduled senna and as needed MiraLAX .

## 2024-09-05 ENCOUNTER — Telehealth: Payer: Self-pay | Admitting: Family

## 2024-09-05 ENCOUNTER — Other Ambulatory Visit: Payer: Self-pay | Admitting: Family

## 2024-09-05 DIAGNOSIS — F03918 Unspecified dementia, unspecified severity, with other behavioral disturbance: Secondary | ICD-10-CM

## 2024-09-05 MED ORDER — LORAZEPAM 0.5 MG PO TABS: 0.5000 mg | ORAL_TABLET | Freq: Every day | ORAL | 1 refills | Status: AC

## 2024-09-05 NOTE — Telephone Encounter (Signed)
 Facility Nurse called on call provider to request refills for Lorazepam  0.5 mg tablet one by mouth at bedtime.
